# Patient Record
Sex: Female | Born: 1935 | Race: White | Hispanic: No | Marital: Married | Smoking: Former smoker
Health system: Southern US, Community
[De-identification: ages and names within clinical notes are randomized; demographics above are authoritative.]

## PROBLEM LIST (undated history)

## (undated) DIAGNOSIS — N390 Urinary tract infection, site not specified: Secondary | ICD-10-CM

## (undated) DIAGNOSIS — L409 Psoriasis, unspecified: Secondary | ICD-10-CM

## (undated) DIAGNOSIS — E039 Hypothyroidism, unspecified: Secondary | ICD-10-CM

## (undated) DIAGNOSIS — I509 Heart failure, unspecified: Secondary | ICD-10-CM

## (undated) DIAGNOSIS — E042 Nontoxic multinodular goiter: Secondary | ICD-10-CM

## (undated) DIAGNOSIS — I5022 Chronic systolic (congestive) heart failure: Secondary | ICD-10-CM

## (undated) DIAGNOSIS — F32A Depression, unspecified: Secondary | ICD-10-CM

## (undated) DIAGNOSIS — K52831 Collagenous colitis: Secondary | ICD-10-CM

## (undated) DIAGNOSIS — E7800 Pure hypercholesterolemia, unspecified: Secondary | ICD-10-CM

## (undated) DIAGNOSIS — T4145XA Adverse effect of unspecified anesthetic, initial encounter: Secondary | ICD-10-CM

## (undated) DIAGNOSIS — D649 Anemia, unspecified: Secondary | ICD-10-CM

## (undated) DIAGNOSIS — Z87442 Personal history of urinary calculi: Secondary | ICD-10-CM

## (undated) DIAGNOSIS — F419 Anxiety disorder, unspecified: Secondary | ICD-10-CM

## (undated) DIAGNOSIS — M069 Rheumatoid arthritis, unspecified: Secondary | ICD-10-CM

## (undated) DIAGNOSIS — T8859XA Other complications of anesthesia, initial encounter: Secondary | ICD-10-CM

## (undated) DIAGNOSIS — C801 Malignant (primary) neoplasm, unspecified: Secondary | ICD-10-CM

## (undated) DIAGNOSIS — J189 Pneumonia, unspecified organism: Secondary | ICD-10-CM

## (undated) DIAGNOSIS — I10 Essential (primary) hypertension: Secondary | ICD-10-CM

## (undated) DIAGNOSIS — R51 Headache: Secondary | ICD-10-CM

## (undated) DIAGNOSIS — F329 Major depressive disorder, single episode, unspecified: Secondary | ICD-10-CM

## (undated) DIAGNOSIS — I209 Angina pectoris, unspecified: Secondary | ICD-10-CM

## (undated) HISTORY — DX: Urinary tract infection, site not specified: N39.0

## (undated) HISTORY — DX: Psoriasis, unspecified: L40.9

## (undated) HISTORY — DX: Major depressive disorder, single episode, unspecified: F32.9

## (undated) HISTORY — PX: ABDOMINAL HYSTERECTOMY: SHX81

## (undated) HISTORY — PX: MASTECTOMY: SHX3

## (undated) HISTORY — PX: THYROID SURGERY: SHX805

## (undated) HISTORY — PX: CHOLECYSTECTOMY: SHX55

## (undated) HISTORY — PX: CARPAL TUNNEL RELEASE: SHX101

## (undated) HISTORY — DX: Anemia, unspecified: D64.9

## (undated) HISTORY — PX: TONSILLECTOMY: SUR1361

## (undated) HISTORY — DX: Nontoxic multinodular goiter: E04.2

## (undated) HISTORY — DX: Hypothyroidism, unspecified: E03.9

## (undated) HISTORY — DX: Rheumatoid arthritis, unspecified: M06.9

## (undated) HISTORY — DX: Depression, unspecified: F32.A

## (undated) HISTORY — PX: OTHER SURGICAL HISTORY: SHX169

## (undated) HISTORY — PX: CATARACT EXTRACTION: SUR2

## (undated) HISTORY — PX: SKIN GRAFT: SHX250

## (undated) HISTORY — DX: Chronic systolic (congestive) heart failure: I50.22

## (undated) HISTORY — PX: APPENDECTOMY: SHX54

## (undated) HISTORY — PX: BREAST SURGERY: SHX581

---

## 1998-04-26 ENCOUNTER — Other Ambulatory Visit: Admission: RE | Admit: 1998-04-26 | Discharge: 1998-04-26 | Payer: Self-pay | Admitting: Oncology

## 1999-09-18 ENCOUNTER — Encounter: Payer: Self-pay | Admitting: Family Medicine

## 1999-09-21 ENCOUNTER — Inpatient Hospital Stay (HOSPITAL_COMMUNITY): Admission: RE | Admit: 1999-09-21 | Discharge: 1999-09-25 | Payer: Self-pay | Admitting: Orthopedic Surgery

## 1999-09-26 ENCOUNTER — Encounter: Admission: RE | Admit: 1999-09-26 | Discharge: 1999-12-25 | Payer: Self-pay | Admitting: Orthopedic Surgery

## 1999-12-25 ENCOUNTER — Encounter: Admission: RE | Admit: 1999-12-25 | Discharge: 2000-01-24 | Payer: Self-pay | Admitting: Orthopedic Surgery

## 2000-06-26 ENCOUNTER — Encounter: Payer: Self-pay | Admitting: Oncology

## 2000-06-26 ENCOUNTER — Encounter: Admission: RE | Admit: 2000-06-26 | Discharge: 2000-06-26 | Payer: Self-pay | Admitting: Oncology

## 2001-06-29 ENCOUNTER — Encounter: Payer: Self-pay | Admitting: Rheumatology

## 2001-06-29 ENCOUNTER — Encounter: Admission: RE | Admit: 2001-06-29 | Discharge: 2001-06-29 | Payer: Self-pay | Admitting: Rheumatology

## 2001-07-01 ENCOUNTER — Encounter: Admission: RE | Admit: 2001-07-01 | Discharge: 2001-07-01 | Payer: Self-pay | Admitting: Oncology

## 2001-07-01 ENCOUNTER — Encounter: Payer: Self-pay | Admitting: Oncology

## 2002-07-06 ENCOUNTER — Encounter: Payer: Self-pay | Admitting: Oncology

## 2002-07-06 ENCOUNTER — Encounter: Admission: RE | Admit: 2002-07-06 | Discharge: 2002-07-06 | Payer: Self-pay | Admitting: Oncology

## 2002-07-28 ENCOUNTER — Other Ambulatory Visit: Admission: RE | Admit: 2002-07-28 | Discharge: 2002-07-28 | Payer: Self-pay | Admitting: Family Medicine

## 2002-08-03 ENCOUNTER — Encounter: Payer: Self-pay | Admitting: Family Medicine

## 2002-08-03 ENCOUNTER — Ambulatory Visit (HOSPITAL_COMMUNITY): Admission: RE | Admit: 2002-08-03 | Discharge: 2002-08-03 | Payer: Self-pay | Admitting: Family Medicine

## 2002-09-03 ENCOUNTER — Encounter: Admission: RE | Admit: 2002-09-03 | Discharge: 2002-09-03 | Payer: Self-pay | Admitting: General Surgery

## 2002-09-03 ENCOUNTER — Encounter: Payer: Self-pay | Admitting: General Surgery

## 2002-10-01 ENCOUNTER — Encounter: Payer: Self-pay | Admitting: General Surgery

## 2002-10-05 ENCOUNTER — Observation Stay (HOSPITAL_COMMUNITY): Admission: RE | Admit: 2002-10-05 | Discharge: 2002-10-06 | Payer: Self-pay | Admitting: General Surgery

## 2002-10-05 ENCOUNTER — Encounter (INDEPENDENT_AMBULATORY_CARE_PROVIDER_SITE_OTHER): Payer: Self-pay

## 2003-07-08 ENCOUNTER — Encounter: Admission: RE | Admit: 2003-07-08 | Discharge: 2003-07-08 | Payer: Self-pay | Admitting: Oncology

## 2003-07-08 ENCOUNTER — Encounter: Payer: Self-pay | Admitting: Oncology

## 2004-07-31 ENCOUNTER — Encounter: Admission: RE | Admit: 2004-07-31 | Discharge: 2004-07-31 | Payer: Self-pay | Admitting: Rheumatology

## 2005-07-26 ENCOUNTER — Ambulatory Visit: Payer: Self-pay | Admitting: Oncology

## 2005-08-01 ENCOUNTER — Encounter: Admission: RE | Admit: 2005-08-01 | Discharge: 2005-08-01 | Payer: Self-pay | Admitting: Oncology

## 2006-07-23 ENCOUNTER — Other Ambulatory Visit: Admission: RE | Admit: 2006-07-23 | Discharge: 2006-07-23 | Payer: Self-pay | Admitting: Family Medicine

## 2006-07-24 ENCOUNTER — Ambulatory Visit: Payer: Self-pay | Admitting: Oncology

## 2006-07-28 ENCOUNTER — Ambulatory Visit: Admission: RE | Admit: 2006-07-28 | Discharge: 2006-07-28 | Payer: Self-pay | Admitting: Family Medicine

## 2006-08-04 ENCOUNTER — Encounter: Admission: RE | Admit: 2006-08-04 | Discharge: 2006-08-04 | Payer: Self-pay | Admitting: Family Medicine

## 2007-07-22 ENCOUNTER — Ambulatory Visit: Payer: Self-pay | Admitting: Oncology

## 2007-08-19 ENCOUNTER — Ambulatory Visit (HOSPITAL_COMMUNITY): Admission: RE | Admit: 2007-08-19 | Discharge: 2007-08-19 | Payer: Self-pay | Admitting: Orthopedic Surgery

## 2007-09-01 LAB — CBC WITH DIFFERENTIAL/PLATELET
BASO%: 0.5 % (ref 0.0–2.0)
Eosinophils Absolute: 0.1 10*3/uL (ref 0.0–0.5)
HCT: 37.4 % (ref 34.8–46.6)
LYMPH%: 12 % — ABNORMAL LOW (ref 14.0–48.0)
MCHC: 34.9 g/dL (ref 32.0–36.0)
NEUT#: 8.1 10*3/uL — ABNORMAL HIGH (ref 1.5–6.5)
NEUT%: 78.6 % — ABNORMAL HIGH (ref 39.6–76.8)
RBC: 4.06 10*6/uL (ref 3.70–5.32)
WBC: 10.3 10*3/uL — ABNORMAL HIGH (ref 3.9–10.0)

## 2007-09-01 LAB — COMPREHENSIVE METABOLIC PANEL
AST: 20 U/L (ref 0–37)
Albumin: 4.3 g/dL (ref 3.5–5.2)
Alkaline Phosphatase: 49 U/L (ref 39–117)
BUN: 26 mg/dL — ABNORMAL HIGH (ref 6–23)
Total Protein: 6.6 g/dL (ref 6.0–8.3)

## 2007-09-02 ENCOUNTER — Encounter: Admission: RE | Admit: 2007-09-02 | Discharge: 2007-09-02 | Payer: Self-pay | Admitting: Oncology

## 2008-08-31 ENCOUNTER — Ambulatory Visit: Payer: Self-pay | Admitting: Oncology

## 2008-09-02 ENCOUNTER — Encounter: Admission: RE | Admit: 2008-09-02 | Discharge: 2008-09-02 | Payer: Self-pay | Admitting: Oncology

## 2008-09-02 LAB — LACTATE DEHYDROGENASE: LDH: 134 U/L (ref 94–250)

## 2008-09-02 LAB — CBC WITH DIFFERENTIAL/PLATELET
BASO%: 0.5 % (ref 0.0–2.0)
EOS%: 1.2 % (ref 0.0–7.0)
HGB: 11.1 g/dL — ABNORMAL LOW (ref 11.6–15.9)
LYMPH%: 23.3 % (ref 14.0–48.0)
MCHC: 33.8 g/dL (ref 32.0–36.0)
MCV: 85.2 fL (ref 81.0–101.0)
Platelets: 313 10*3/uL (ref 145–400)
RBC: 3.85 10*6/uL (ref 3.70–5.32)
RDW: 14.1 % (ref 11.3–14.5)

## 2008-09-02 LAB — COMPREHENSIVE METABOLIC PANEL
ALT: 12 U/L (ref 0–35)
AST: 9 U/L (ref 0–37)
BUN: 14 mg/dL (ref 6–23)
CO2: 20 mEq/L (ref 19–32)
Calcium: 8.8 mg/dL (ref 8.4–10.5)
Chloride: 107 mEq/L (ref 96–112)
Total Bilirubin: 0.2 mg/dL — ABNORMAL LOW (ref 0.3–1.2)
Total Protein: 6.4 g/dL (ref 6.0–8.3)

## 2009-08-31 ENCOUNTER — Ambulatory Visit: Payer: Self-pay | Admitting: Oncology

## 2009-09-04 ENCOUNTER — Encounter: Admission: RE | Admit: 2009-09-04 | Discharge: 2009-09-04 | Payer: Self-pay | Admitting: Oncology

## 2009-10-19 ENCOUNTER — Ambulatory Visit: Payer: Self-pay | Admitting: Oncology

## 2010-09-05 ENCOUNTER — Encounter: Admission: RE | Admit: 2010-09-05 | Discharge: 2010-09-05 | Payer: Self-pay | Admitting: Oncology

## 2010-10-18 ENCOUNTER — Ambulatory Visit: Payer: Self-pay | Admitting: Oncology

## 2010-11-11 ENCOUNTER — Encounter: Payer: Self-pay | Admitting: Oncology

## 2011-02-06 ENCOUNTER — Other Ambulatory Visit: Payer: Self-pay | Admitting: Family Medicine

## 2011-02-07 ENCOUNTER — Ambulatory Visit
Admission: RE | Admit: 2011-02-07 | Discharge: 2011-02-07 | Disposition: A | Discharge status: Home / Self Care | Payer: Medicare Other | Source: Ambulatory Visit | Attending: Family Medicine | Admitting: Family Medicine

## 2011-02-07 MED ORDER — IOHEXOL 300 MG/ML  SOLN
125.0000 mL | Freq: Once | INTRAMUSCULAR | Status: AC | PRN
  Administered 2011-02-07: 125 mL via INTRAVENOUS

## 2011-03-08 NOTE — Op Note (Signed)
Olivia Mullen, Olivia Mullen                         ACCOUNT NO.:  [id number]   MEDICAL RECORD NO.:  [id number]                   PATIENT TYPE:  AMB   LOCATION:  DAY                                  FACILITY:  [facility]   PHYSICIAN:  Gita Kudo, M.D.              DATE OF BIRTH:  [birth year]   DATE OF PROCEDURE:  10/05/2002  DATE OF DISCHARGE:                                 OPERATIVE REPORT   OPERATIVE PROCEDURE:  Total thyroidectomy.   SURGEON:  Gita Kudo, M.D.   ASSISTANT:  Velora Heckler, M.D.   ANESTHESIA:  General endotracheal.   PREOPERATIVE DIAGNOSIS:  Large, multinodular goiter with pressure symptoms.   POSTOPERATIVE DIAGNOSIS:  Large, multinodular goiter with pressure symptoms.   CLINICAL SUMMARY:  A 75 year old lady with multiple medical problems, has  had breast cancer, arthritis, shoulder surgery, and has an enlarging mass in  her neck.  Ultrasound consistent with multinodular goiter.  She is on  Synthroid, and the mass is enlarging.   OPERATIVE FINDINGS:  The patient had a large, multilobulated, nodular  goiter.  Both lobes were equally massively enlarged and were pressing on the  trachea and esophagus.  The parathyroid glands and recurrent nerves were not  injured.   OPERATIVE PROCEDURE:  Under satisfactory general endotracheal anesthesia,  the patient was carefully positioned and then prepped and draped in the  standard fashion.  Transverse neck incision made and carried down through  the platysma and a large amount of subcu fat.  Then flaps were developed  above the strap muscles and self-retaining retractors placed.  The midline  was opened and the left side and right side felt.  The right side was  removed first.   A plane developed under the strap muscles over the gland.  Gentle finger  dissection was used to retract the very large gland medially.  Then the  inferior vein was divided between clamps and ties of silk.  The superior  pole was taken down  between clamps and ties of silk and also metal clips.  Then the gland was carefully mobilized medially, and the parathyroid and  area of recurrent nerve were not entered and pushed away from the gland.  We  stayed close to the trachea and using cautery, removed the gland over to the  isthmus which was quite thin.  This then removed the entire right lobe.  The  bed was checked for hemostasis, lavaged with saline, and packed with a light  gauze.   Then the left side was approached in a similar fashion.  Again, gentle  finger dissection was used, and the gland placed on traction.  Care was  taken laterally to avoid the areas of nerve and parathyroid, and parathyroid  glands were identified and not hurt.  The vessels were divided between  clamps and ties of 2-0 silk or metal clips. Again, the gland was then  removed  from the underlying trachea.  Following this, the operative site was  checked for hemostasis and made dry by cautery and clips.  A small piece of  Surgicel was placed on each bed and then the wound closed in  layers with running 3-0 Vicryl for external oblique, interrupted 4-0 Vicryl  for platysma, Steri-Strips and staples for skin.  Sterile dressings were  applied, and the patient went to the recovery room from the operating room  in good condition.                                               Gita Kudo, M.D.    MRL/MEDQ  D:  10/05/2002  T:  10/05/2002  Job:  [id number]   cc:   Demetria Pore. Coral Spikes, M.D.  [address]  [address]  [place]  [place] [address]  Fax: [phone]   Stacie Acres. White, M.D.  [address]  [place]  [place] [address]  Fax: [phone]

## 2011-08-02 ENCOUNTER — Other Ambulatory Visit: Payer: Self-pay | Admitting: Oncology

## 2011-08-02 DIAGNOSIS — Z1231 Encounter for screening mammogram for malignant neoplasm of breast: Secondary | ICD-10-CM

## 2011-08-02 DIAGNOSIS — Z9011 Acquired absence of right breast and nipple: Secondary | ICD-10-CM

## 2011-09-09 ENCOUNTER — Ambulatory Visit
Admission: RE | Admit: 2011-09-09 | Discharge: 2011-09-09 | Disposition: A | Discharge status: Home / Self Care | Payer: Medicare Other | Source: Ambulatory Visit | Attending: Oncology | Admitting: Oncology

## 2011-09-09 DIAGNOSIS — Z9011 Acquired absence of right breast and nipple: Secondary | ICD-10-CM

## 2011-09-09 DIAGNOSIS — Z1231 Encounter for screening mammogram for malignant neoplasm of breast: Secondary | ICD-10-CM

## 2011-10-01 ENCOUNTER — Telehealth: Payer: Self-pay | Admitting: Oncology

## 2011-10-01 NOTE — Telephone Encounter (Signed)
Called pt , left message to call us back to r/s appt cancelled due to Epic

## 2011-10-31 DIAGNOSIS — Z79899 Other long term (current) drug therapy: Secondary | ICD-10-CM | POA: Diagnosis not present

## 2011-10-31 DIAGNOSIS — M1990 Unspecified osteoarthritis, unspecified site: Secondary | ICD-10-CM | POA: Diagnosis not present

## 2011-10-31 DIAGNOSIS — M069 Rheumatoid arthritis, unspecified: Secondary | ICD-10-CM | POA: Diagnosis not present

## 2011-10-31 DIAGNOSIS — M2550 Pain in unspecified joint: Secondary | ICD-10-CM | POA: Diagnosis not present

## 2011-11-13 DIAGNOSIS — E039 Hypothyroidism, unspecified: Secondary | ICD-10-CM | POA: Diagnosis not present

## 2011-12-04 DIAGNOSIS — M069 Rheumatoid arthritis, unspecified: Secondary | ICD-10-CM | POA: Diagnosis not present

## 2011-12-04 DIAGNOSIS — Z79899 Other long term (current) drug therapy: Secondary | ICD-10-CM | POA: Diagnosis not present

## 2011-12-16 ENCOUNTER — Encounter: Payer: Self-pay | Admitting: Oncology

## 2011-12-16 ENCOUNTER — Other Ambulatory Visit: Payer: Medicare Other | Admitting: Lab

## 2011-12-16 ENCOUNTER — Ambulatory Visit (HOSPITAL_BASED_OUTPATIENT_CLINIC_OR_DEPARTMENT_OTHER): Payer: Medicare Other | Admitting: Oncology

## 2011-12-16 VITALS — BP 167/66 | HR 119 | Temp 98.0°F | Ht 64.0 in | Wt 190.5 lb

## 2011-12-16 DIAGNOSIS — E119 Type 2 diabetes mellitus without complications: Secondary | ICD-10-CM

## 2011-12-16 DIAGNOSIS — M069 Rheumatoid arthritis, unspecified: Secondary | ICD-10-CM | POA: Diagnosis not present

## 2011-12-16 DIAGNOSIS — C50919 Malignant neoplasm of unspecified site of unspecified female breast: Secondary | ICD-10-CM

## 2011-12-16 DIAGNOSIS — C50419 Malignant neoplasm of upper-outer quadrant of unspecified female breast: Secondary | ICD-10-CM

## 2011-12-16 DIAGNOSIS — E039 Hypothyroidism, unspecified: Secondary | ICD-10-CM

## 2011-12-16 DIAGNOSIS — Z1231 Encounter for screening mammogram for malignant neoplasm of breast: Secondary | ICD-10-CM

## 2011-12-16 DIAGNOSIS — E042 Nontoxic multinodular goiter: Secondary | ICD-10-CM

## 2011-12-16 DIAGNOSIS — D649 Anemia, unspecified: Secondary | ICD-10-CM

## 2011-12-16 HISTORY — DX: Rheumatoid arthritis, unspecified: M06.9

## 2011-12-16 HISTORY — DX: Nontoxic multinodular goiter: E04.2

## 2011-12-16 HISTORY — DX: Anemia, unspecified: D64.9

## 2011-12-16 HISTORY — DX: Hypothyroidism, unspecified: E03.9

## 2011-12-16 LAB — COMPREHENSIVE METABOLIC PANEL
ALT: 22 U/L (ref 0–35)
BUN: 15 mg/dL (ref 6–23)
CO2: 20 mEq/L (ref 19–32)
Calcium: 9.3 mg/dL (ref 8.4–10.5)
Chloride: 112 mEq/L (ref 96–112)
Glucose, Bld: 83 mg/dL (ref 70–99)
Potassium: 4.1 mEq/L (ref 3.5–5.3)
Total Protein: 5.6 g/dL — ABNORMAL LOW (ref 6.0–8.3)

## 2011-12-16 LAB — CBC WITH DIFFERENTIAL/PLATELET
BASO%: 1.1 % (ref 0.0–2.0)
Basophils Absolute: 0.1 10*3/uL (ref 0.0–0.1)
HGB: 12.7 g/dL (ref 11.6–15.9)
MONO%: 9.5 % (ref 0.0–14.0)
NEUT#: 5.8 10*3/uL (ref 1.5–6.5)
Platelets: 222 10*3/uL (ref 145–400)
RBC: 4.29 10*6/uL (ref 3.70–5.45)
WBC: 9 10*3/uL (ref 3.9–10.3)

## 2011-12-16 NOTE — Progress Notes (Signed)
Hematology and Oncology Follow Up Visit  Olivia Mullen [id number] 1936-02-24 76 y.o. 12/16/2011 7:42 PM   Principle Diagnosis: Encounter Diagnoses  Name Primary?  . Breast cancer, stage 2 Yes  . Rheumatoid arthritis   . Multinodular goiter (nontoxic)   . Hypothyroidism (acquired)   . DM type 2 (diabetes mellitus, type 2)   . Normochromic normocytic anemia      Interim History:   It is hard to believe that Olivia Mullen is now out over 19 years from diagnosis of  stage II 1 node positive ER positive synchronous primary tumors of the left breast diagnosed in November 1993. She was treated with mastectomy with immediate TRAM reconstruction followed by CAF chemotherapy then tamoxifen hormonal therapy. She remains free of any obvious recurrence at this time. Most recent mammography done 09/09/2011 shows no new disease.  Her main active medical problem continues to be advanced rheumatoid arthritis. She is on chronic steroids. She is receiving Enbrel injections, Plaquenil, and Arava. Current prednisone dose 8 mg daily. She is now followed by Dr. Zenovia Jordan since Dr.Levitin retired.  Medications: reviewed  Allergies: No Known Allergies  Review of Systems: Constitutional:  Chronic fatigue no weight loss  Respiratory: No dyspnea or cough Cardiovascular:  No ischemic type chest pain, pressure, or palpitations. Gastrointestinal: No change in bowel habit. No hematochezia or melena. Genito-Urinary: No vaginal bleeding. Musculoskeletal: No bone pain other than that related to her arthritis. Chronic arthralgias. Neurologic: Chronic migraine headaches. No change in pattern. Skin: Recent dermatitis on the nose and under the right chin She has had bilateral cataract removal since last visit here Remaining ROS negative.  Physical Exam: Blood pressure 167/66, pulse 119, temperature 98 F (36.7 C), temperature source Oral, height 5\' 4"  (1.626 m), weight 190 lb 8 oz (86.41 kg). Wt Readings from  Last 3 Encounters:  12/16/11 190 lb 8 oz (86.41 kg)     General appearance: A mildly cushingoid Caucasian woman HENNT: Abrasion on the nose; pharynx no erythema or exudate Lymph nodes: No cervical supraclavicular or axillary adenopathy Breasts: Right TRAM reconstruction and no left breast masses no skin lesions Lungs: Clear to auscultation resonant to percussion Heart: Regular rhythm no murmur Abdomen: Soft nontender no mass no organomegaly Extremities: No edema no calf tenderness Vascular: No cyanosis Neurologic: Motor strength 5 over 5 cranial nerves grossly normal optic disc sharp on the left not well visualized on the right Skin: Dermatitis over the nose which appears to be healing and self-limited  Lab Results: Lab Results  Component Value Date   WBC 9.0 12/16/2011   HGB 12.7 12/16/2011   HCT 38.6 12/16/2011   MCV 89.8 12/16/2011   PLT 222 12/16/2011     Chemistry      Component Value Date/Time   NA 142 12/16/2011 1148   K 4.1 12/16/2011 1148   CL 112 12/16/2011 1148   CO2 20 12/16/2011 1148   BUN 15 12/16/2011 1148   CREATININE 0.75 12/16/2011 1148      Component Value Date/Time   CALCIUM 9.3 12/16/2011 1148   ALKPHOS 50 12/16/2011 1148   AST 17 12/16/2011 1148   ALT 22 12/16/2011 1148   BILITOT 0.3 12/16/2011 1148       Radiological Studies: See above    Impression and Plan: #1. Synchronous primary tumors of the left breast treated as outlined above. She remains free of recurrence now out over 19 years. I told her I will see her again next year and then she could  graduate from our practice.  #2. Advanced rheumatoid arthritis on multiple medications.  #3. History of multinodular goiter.  #4. Hypothyroid on replacement.  #5. Steroid-induced diabetes.   CC:. Dr. Merri Brunette; Dr. Zenovia Jordan   Levert Feinstein, MD 2/25/20137:42 PM

## 2011-12-18 DIAGNOSIS — M1710 Unilateral primary osteoarthritis, unspecified knee: Secondary | ICD-10-CM | POA: Diagnosis not present

## 2011-12-25 ENCOUNTER — Telehealth: Payer: Self-pay | Admitting: Oncology

## 2011-12-25 NOTE — Telephone Encounter (Signed)
Called pt, left message regarding mammogram for November 2013, also reminded pt of appt on February 2014

## 2012-01-09 DIAGNOSIS — E876 Hypokalemia: Secondary | ICD-10-CM | POA: Diagnosis not present

## 2012-01-09 DIAGNOSIS — E039 Hypothyroidism, unspecified: Secondary | ICD-10-CM | POA: Diagnosis not present

## 2012-01-20 DIAGNOSIS — M2550 Pain in unspecified joint: Secondary | ICD-10-CM | POA: Diagnosis not present

## 2012-01-20 DIAGNOSIS — IMO0001 Reserved for inherently not codable concepts without codable children: Secondary | ICD-10-CM | POA: Diagnosis not present

## 2012-01-20 DIAGNOSIS — R0602 Shortness of breath: Secondary | ICD-10-CM | POA: Diagnosis not present

## 2012-01-20 DIAGNOSIS — M069 Rheumatoid arthritis, unspecified: Secondary | ICD-10-CM | POA: Diagnosis not present

## 2012-01-20 DIAGNOSIS — Z79899 Other long term (current) drug therapy: Secondary | ICD-10-CM | POA: Diagnosis not present

## 2012-01-20 DIAGNOSIS — G43909 Migraine, unspecified, not intractable, without status migrainosus: Secondary | ICD-10-CM | POA: Diagnosis not present

## 2012-01-20 DIAGNOSIS — M1990 Unspecified osteoarthritis, unspecified site: Secondary | ICD-10-CM | POA: Diagnosis not present

## 2012-02-06 DIAGNOSIS — R350 Frequency of micturition: Secondary | ICD-10-CM | POA: Diagnosis not present

## 2012-02-11 ENCOUNTER — Other Ambulatory Visit: Payer: Self-pay | Admitting: Oncology

## 2012-02-12 DIAGNOSIS — IMO0001 Reserved for inherently not codable concepts without codable children: Secondary | ICD-10-CM | POA: Diagnosis not present

## 2012-02-12 DIAGNOSIS — F329 Major depressive disorder, single episode, unspecified: Secondary | ICD-10-CM | POA: Diagnosis not present

## 2012-02-12 DIAGNOSIS — I10 Essential (primary) hypertension: Secondary | ICD-10-CM | POA: Diagnosis not present

## 2012-02-12 DIAGNOSIS — E039 Hypothyroidism, unspecified: Secondary | ICD-10-CM | POA: Diagnosis not present

## 2012-02-12 DIAGNOSIS — E559 Vitamin D deficiency, unspecified: Secondary | ICD-10-CM | POA: Diagnosis not present

## 2012-02-12 DIAGNOSIS — E119 Type 2 diabetes mellitus without complications: Secondary | ICD-10-CM | POA: Diagnosis not present

## 2012-02-12 DIAGNOSIS — R0602 Shortness of breath: Secondary | ICD-10-CM | POA: Diagnosis not present

## 2012-02-12 DIAGNOSIS — Z23 Encounter for immunization: Secondary | ICD-10-CM | POA: Diagnosis not present

## 2012-02-12 DIAGNOSIS — M1990 Unspecified osteoarthritis, unspecified site: Secondary | ICD-10-CM | POA: Diagnosis not present

## 2012-02-13 DIAGNOSIS — F3342 Major depressive disorder, recurrent, in full remission: Secondary | ICD-10-CM | POA: Diagnosis not present

## 2012-03-03 DIAGNOSIS — Z049 Encounter for examination and observation for unspecified reason: Secondary | ICD-10-CM | POA: Diagnosis not present

## 2012-03-03 DIAGNOSIS — G43719 Chronic migraine without aura, intractable, without status migrainosus: Secondary | ICD-10-CM | POA: Diagnosis not present

## 2012-03-04 DIAGNOSIS — IMO0001 Reserved for inherently not codable concepts without codable children: Secondary | ICD-10-CM | POA: Diagnosis not present

## 2012-03-04 DIAGNOSIS — R51 Headache: Secondary | ICD-10-CM | POA: Diagnosis not present

## 2012-03-04 DIAGNOSIS — M542 Cervicalgia: Secondary | ICD-10-CM | POA: Diagnosis not present

## 2012-03-04 DIAGNOSIS — G518 Other disorders of facial nerve: Secondary | ICD-10-CM | POA: Diagnosis not present

## 2012-03-05 ENCOUNTER — Emergency Department (HOSPITAL_COMMUNITY): Payer: Medicare Other

## 2012-03-05 ENCOUNTER — Encounter (HOSPITAL_COMMUNITY): Payer: Self-pay | Admitting: *Deleted

## 2012-03-05 ENCOUNTER — Inpatient Hospital Stay (HOSPITAL_COMMUNITY)
Admission: EM | Admit: 2012-03-05 | Discharge: 2012-03-09 | DRG: 293 | Disposition: A | Discharge status: Home / Self Care | Payer: Medicare Other | Source: Ambulatory Visit | Attending: Internal Medicine | Admitting: Internal Medicine

## 2012-03-05 DIAGNOSIS — E042 Nontoxic multinodular goiter: Secondary | ICD-10-CM

## 2012-03-05 DIAGNOSIS — I5023 Acute on chronic systolic (congestive) heart failure: Principal | ICD-10-CM | POA: Diagnosis present

## 2012-03-05 DIAGNOSIS — I5021 Acute systolic (congestive) heart failure: Secondary | ICD-10-CM | POA: Diagnosis not present

## 2012-03-05 DIAGNOSIS — I517 Cardiomegaly: Secondary | ICD-10-CM | POA: Diagnosis not present

## 2012-03-05 DIAGNOSIS — I10 Essential (primary) hypertension: Secondary | ICD-10-CM | POA: Diagnosis not present

## 2012-03-05 DIAGNOSIS — M1710 Unilateral primary osteoarthritis, unspecified knee: Secondary | ICD-10-CM | POA: Diagnosis not present

## 2012-03-05 DIAGNOSIS — C50919 Malignant neoplasm of unspecified site of unspecified female breast: Secondary | ICD-10-CM | POA: Diagnosis present

## 2012-03-05 DIAGNOSIS — Z853 Personal history of malignant neoplasm of breast: Secondary | ICD-10-CM

## 2012-03-05 DIAGNOSIS — M79609 Pain in unspecified limb: Secondary | ICD-10-CM | POA: Diagnosis not present

## 2012-03-05 DIAGNOSIS — M25559 Pain in unspecified hip: Secondary | ICD-10-CM | POA: Diagnosis not present

## 2012-03-05 DIAGNOSIS — M25469 Effusion, unspecified knee: Secondary | ICD-10-CM | POA: Diagnosis not present

## 2012-03-05 DIAGNOSIS — I509 Heart failure, unspecified: Secondary | ICD-10-CM | POA: Diagnosis present

## 2012-03-05 DIAGNOSIS — Z87891 Personal history of nicotine dependence: Secondary | ICD-10-CM

## 2012-03-05 DIAGNOSIS — R079 Chest pain, unspecified: Secondary | ICD-10-CM | POA: Diagnosis not present

## 2012-03-05 DIAGNOSIS — R0601 Orthopnea: Secondary | ICD-10-CM | POA: Diagnosis not present

## 2012-03-05 DIAGNOSIS — J90 Pleural effusion, not elsewhere classified: Secondary | ICD-10-CM | POA: Diagnosis not present

## 2012-03-05 DIAGNOSIS — D649 Anemia, unspecified: Secondary | ICD-10-CM

## 2012-03-05 DIAGNOSIS — Z79899 Other long term (current) drug therapy: Secondary | ICD-10-CM

## 2012-03-05 DIAGNOSIS — E119 Type 2 diabetes mellitus without complications: Secondary | ICD-10-CM | POA: Diagnosis present

## 2012-03-05 DIAGNOSIS — J9819 Other pulmonary collapse: Secondary | ICD-10-CM | POA: Diagnosis not present

## 2012-03-05 DIAGNOSIS — R0600 Dyspnea, unspecified: Secondary | ICD-10-CM

## 2012-03-05 DIAGNOSIS — E876 Hypokalemia: Secondary | ICD-10-CM | POA: Diagnosis not present

## 2012-03-05 DIAGNOSIS — R0602 Shortness of breath: Secondary | ICD-10-CM | POA: Diagnosis not present

## 2012-03-05 DIAGNOSIS — E1169 Type 2 diabetes mellitus with other specified complication: Secondary | ICD-10-CM | POA: Diagnosis not present

## 2012-03-05 DIAGNOSIS — IMO0002 Reserved for concepts with insufficient information to code with codable children: Secondary | ICD-10-CM

## 2012-03-05 DIAGNOSIS — E039 Hypothyroidism, unspecified: Secondary | ICD-10-CM | POA: Diagnosis present

## 2012-03-05 DIAGNOSIS — M069 Rheumatoid arthritis, unspecified: Secondary | ICD-10-CM | POA: Diagnosis present

## 2012-03-05 DIAGNOSIS — M1610 Unilateral primary osteoarthritis, unspecified hip: Secondary | ICD-10-CM | POA: Diagnosis not present

## 2012-03-05 HISTORY — DX: Heart failure, unspecified: I50.9

## 2012-03-05 HISTORY — DX: Malignant (primary) neoplasm, unspecified: C80.1

## 2012-03-05 HISTORY — DX: Headache: R51

## 2012-03-05 HISTORY — DX: Anemia, unspecified: D64.9

## 2012-03-05 LAB — CBC
MCH: 29.9 pg (ref 26.0–34.0)
MCHC: 32.4 g/dL (ref 30.0–36.0)
MCV: 92.2 fL (ref 78.0–100.0)
Platelets: 218 10*3/uL (ref 150–400)
RDW: 14.6 % (ref 11.5–15.5)

## 2012-03-05 NOTE — ED Notes (Signed)
Had breathing tx and chest xray at urgent care

## 2012-03-05 NOTE — ED Provider Notes (Signed)
History     CSN: [id number]  Arrival date & time 03/05/12  2152   First MD Initiated Contact with Patient 03/05/12 2249      Chief Complaint  Patient presents with  . Shortness of Breath    (Consider location/radiation/quality/duration/timing/severity/associated sxs/prior treatment) HPI  Pt presents to the ED with worsening SOB since 4 weeks ago. The patient fell 1 week ago and has bruising and pleuritic chest pain to her anterior chest wall. She states that since then her SOB has been increasingly getting worse. Today she was unable to walk without getting very winded. She saw a doctor at the walk in clinic today where a chest xray was done and a breathing treatment and they sent her to the ED. She says that the breathing treatment did help her symptoms a lot. She is still complaining of pain. She also complaints of right hip and right knee pain. She denies a history of blood clots. Patients is tachycardic in triage and exam room. She does admit to having problems with anxiety, feeling stressed at home, and her medications for anxiety recently being changed to Wellbutrin.  Past Medical History  Diagnosis Date  . Rheumatoid arthritis 12/16/2011  . Multinodular goiter (nontoxic) 12/16/2011  . Hypothyroidism (acquired) 12/16/2011  . DM type 2 (diabetes mellitus, type 2) 12/16/2011  . Normochromic normocytic anemia 12/16/2011  . Cancer     breast, 20 years ago  . Shortness of breath   . Anemia   . Headache   . CHF (congestive heart failure)     Past Surgical History  Procedure Date  . Mastectomy     right  . Carpal tunnel release   . Thyroid surgery   . Abdominal hysterectomy   . Left shoulder     History reviewed. No pertinent family history.  History  Substance Use Topics  . Smoking status: Former Games developer  . Smokeless tobacco: Never Used  . Alcohol Use: Yes     occasional    OB History    Grav Para Term Preterm Abortions TAB SAB Ect Mult Living                   Review of Systems   HEENT: denies blurry vision or change in hearing PULMONARY:pt has chest pain and shortness of breath CARDIAC: denies chest pain or heart palpitations MUSCULOSKELETAL:  denies being unable to ambulate pt having chest wall pain ABDOMEN AL: denies abdominal pain GU: denies loss of bowel or urinary control NEURO: denies numbness and tingling in extremities   Allergies  Review of patient's allergies indicates no known allergies.  Home Medications   No current outpatient prescriptions on file.  BP 115/63  Pulse 100  Temp(Src) 97.8 F (36.6 C) (Oral)  Resp 18  Wt 182 lb 11.2 oz (82.872 kg)  SpO2 97%  Physical Exam  Nursing note and vitals reviewed. Constitutional: She appears well-developed and well-nourished. No distress.  HENT:  Head: Normocephalic and atraumatic.  Eyes: Pupils are equal, round, and reactive to light.  Neck: Normal range of motion. Neck supple.  Cardiovascular: Normal rate and regular rhythm.   Pulmonary/Chest: Effort normal. No respiratory distress. She has wheezes. She has no rales. Chest wall is not dull to percussion. She exhibits bony tenderness. She exhibits no mass, no tenderness, no crepitus and no retraction.         Patient had moderate bruising to her breasts and chest wall from her fall. No crepitus or obvious deformity.  Abdominal:  Soft. She exhibits no distension. There is no tenderness. There is no rebound.  Musculoskeletal:       Right hip: She exhibits tenderness. She exhibits normal range of motion, normal strength, no bony tenderness, no swelling, no crepitus, no deformity and no laceration.       Right knee: She exhibits normal range of motion, no swelling, no effusion, no ecchymosis, no deformity, no laceration and no erythema. tenderness found. Medial joint line and lateral joint line tenderness noted.  Neurological: She is alert.  Skin: Skin is warm and dry.    ED Course  Procedures (including critical care  time)  Labs Reviewed  CBC - Abnormal; Notable for the following:    WBC 11.5 (*)    All other components within normal limits  BASIC METABOLIC PANEL - Abnormal; Notable for the following:    Glucose, Bld 110 (*)    GFR calc non Af Amer 53 (*)    GFR calc Af Amer 62 (*)    All other components within normal limits  PRO B NATRIURETIC PEPTIDE - Abnormal; Notable for the following:    Pro B Natriuretic peptide (BNP) 5292.0 (*)    All other components within normal limits  CARDIAC PANEL(CRET KIN+CKTOT+MB+TROPI) - Abnormal; Notable for the following:    Total CK 581 (*)    CK, MB 6.0 (*)    All other components within normal limits  CBC - Abnormal; Notable for the following:    WBC 12.1 (*)    All other components within normal limits  CREATININE, SERUM - Abnormal; Notable for the following:    GFR calc non Af Amer 58 (*)    GFR calc Af Amer 68 (*)    All other components within normal limits  CARDIAC PANEL(CRET KIN+CKTOT+MB+TROPI) - Abnormal; Notable for the following:    Total CK 549 (*)    CK, MB 5.9 (*)    All other components within normal limits  CARDIAC PANEL(CRET KIN+CKTOT+MB+TROPI) - Abnormal; Notable for the following:    Total CK 492 (*)    CK, MB 5.4 (*)    All other components within normal limits  CARDIAC PANEL(CRET KIN+CKTOT+MB+TROPI) - Abnormal; Notable for the following:    Total CK 293 (*)    All other components within normal limits  GLUCOSE, CAPILLARY - Abnormal; Notable for the following:    Glucose-Capillary 66 (*)    All other components within normal limits  GLUCOSE, CAPILLARY - Abnormal; Notable for the following:    Glucose-Capillary 124 (*)    All other components within normal limits  BASIC METABOLIC PANEL - Abnormal; Notable for the following:    Potassium 2.6 (*)    Glucose, Bld 56 (*)    GFR calc non Af Amer 55 (*)    GFR calc Af Amer 64 (*)    All other components within normal limits  GLUCOSE, CAPILLARY - Abnormal; Notable for the following:     Glucose-Capillary 44 (*)    All other components within normal limits  GLUCOSE, CAPILLARY - Abnormal; Notable for the following:    Glucose-Capillary 115 (*)    All other components within normal limits  GLUCOSE, CAPILLARY - Abnormal; Notable for the following:    Glucose-Capillary 35 (*)    All other components within normal limits  GLUCOSE, CAPILLARY - Abnormal; Notable for the following:    Glucose-Capillary 44 (*)    All other components within normal limits  GLUCOSE, CAPILLARY - Abnormal; Notable for the following:  Glucose-Capillary 116 (*)    All other components within normal limits  GLUCOSE, CAPILLARY - Abnormal; Notable for the following:    Glucose-Capillary 67 (*)    All other components within normal limits  GLUCOSE, CAPILLARY - Abnormal; Notable for the following:    Glucose-Capillary 153 (*)    All other components within normal limits  HEMOGLOBIN A1C  TSH  GLUCOSE, CAPILLARY  CBC  GLUCOSE, CAPILLARY  MAGNESIUM  GLUCOSE, CAPILLARY  BASIC METABOLIC PANEL  BASIC METABOLIC PANEL   Dg Ribs Unilateral W/chest Left  03/05/2012  *RADIOLOGY REPORT*  Clinical Data: Shortness of breath and left lower rib pain after fall.  BB marks the area of pain.  LEFT RIBS AND CHEST - 3+ VIEW  Comparison: 03/05/2012 at 1952 hours from the [facility].  Findings: Mild cardiac enlargement.  Calcified and tortuous aorta. No focal airspace consolidation in the lungs.  No blunting of costophrenic angles.  No pneumothorax.  Surgical clips in the right axilla and base of neck.  Left shoulder arthroplasty.  Focal irregularities in the anterior left seventh and eighth ribs without acute cortical change likely representing old fracture deformities.  No acute fracture or displacement demonstrated.  No focal bone lesion or expansile change.  IMPRESSION: No evidence of active pulmonary disease.  Cardiac enlargement.  No acute left rib fractures demonstrated.  Original Report Authenticated By:  Marlon Pel, M.D.   Dg Hip Complete Right  03/05/2012  *RADIOLOGY REPORT*  Clinical Data: Right hip pain and instability.  RIGHT HIP - COMPLETE 2+ VIEW  Comparison: None.  Findings: Asymmetric and moderate to severe loss articular space noted in the right hip, particularly axially.  No erosion noted. There is only mild loss of articular space in the left hip. Ossific density along the right ischial tuberosity probably represents a chronically fragmented osteophyte.  There is spurring of the right femoral head.  No fracture or dislocation is observed.  IMPRESSION:  1.  Moderate to marked degenerative right hip arthropathy.  Mild left hip arthropathy. 2.  Suspected chronically fragmented osteophyte along the right hamstring origination.  Original Report Authenticated By: Dellia Cloud, M.D.   Ct Angio Chest W/cm &/or Wo Cm  03/06/2012  *RADIOLOGY REPORT*  Clinical Data: Shortness of breath with left-sided chest pain. Tachycardia.  Fall.  CT ANGIOGRAPHY CHEST  Technique:  Multidetector CT imaging of the chest using the standard protocol during bolus administration of intravenous contrast. Multiplanar reconstructed images including MIPs were obtained and reviewed to evaluate the vascular anatomy.  Contrast: OMNIPAQUE IOHEXOL 350 MG/ML SOLN  Comparison: None.  Findings: Technically adequate study with good opacification of the central and segmental pulmonary arteries.  No focal filling defects demonstrated.  No evidence of significant pulmonary embolus. Cardiac enlargement with passive congestion of contrast material into the liver.  Normal caliber thoracic aorta with calcification. Prominent right paratracheal and anterior mediastinal fat.  No significant lymphadenopathy in the chest.  Esophagus is mostly decompressed.  The small bilateral pleural effusions.  Mild bilateral basilar atelectasis.  No focal airspace consolidation or interstitial changes.  Scattered calcified granulomas in the  lungs. No pneumothorax.  Postoperative changes in the left shoulder.  No displaced rib fractures.  Mild degenerative changes in the thoracic spine.  Degenerative change in the right shoulder.  Previous thyroidectomy.  IMPRESSION: No evidence of significant pulmonary embolus.  Congestive changes in the heart lungs with cardiac enlargement, passive congestion of the liver, and small bilateral pleural effusions.  Mild bilateral basilar  atelectasis.  Original Report Authenticated By: Marlon Pel, M.D.   Dg Knee Complete 4 Views Right  03/05/2012  *RADIOLOGY REPORT*  Clinical Data: Right leg pain.  Leg instability.  RIGHT KNEE - COMPLETE 4+ VIEW  Comparison: None.  Findings: Osteoarthritis noted with loss of medial and lateral compartmental articular space, marginal medial and lateral compartmental spurring, and mild patellofemoral spurring.  On the lateral projection there appears to be a small knee effusion.  IMPRESSION:  1.  Moderate osteoarthritis of the knee. 2.  Small knee effusion.  Original Report Authenticated By: Dellia Cloud, M.D.     1. Dyspnea   2. CHF (congestive heart failure)   3. Acute systolic CHF (congestive heart failure)       MDM  Hip and knee xrays on right side show osteoarthritis but no acute abnormalities.  The patients Rib xray negative for fracture.  Pt tachy with pleuritic pain post fall, CT angio of chest ordered.   End of shift care handed off to Dr. Norlene Campbell. Awaiting CT angio of chest results for further management.        Dorthula Matas, PA 03/07/12 2236

## 2012-03-05 NOTE — ED Notes (Signed)
Pt c/o SOB since fall on Sunday.  Bruising to left chest.  Pain with inspiration.  Been to walkin clinic today, 1 breathing tx

## 2012-03-06 ENCOUNTER — Encounter (HOSPITAL_COMMUNITY): Payer: Self-pay | Admitting: Radiology

## 2012-03-06 ENCOUNTER — Emergency Department (HOSPITAL_COMMUNITY): Payer: Medicare Other

## 2012-03-06 DIAGNOSIS — E1169 Type 2 diabetes mellitus with other specified complication: Secondary | ICD-10-CM | POA: Diagnosis not present

## 2012-03-06 DIAGNOSIS — I5023 Acute on chronic systolic (congestive) heart failure: Secondary | ICD-10-CM | POA: Diagnosis not present

## 2012-03-06 DIAGNOSIS — E1165 Type 2 diabetes mellitus with hyperglycemia: Secondary | ICD-10-CM

## 2012-03-06 DIAGNOSIS — IMO0001 Reserved for inherently not codable concepts without codable children: Secondary | ICD-10-CM

## 2012-03-06 DIAGNOSIS — D649 Anemia, unspecified: Secondary | ICD-10-CM | POA: Diagnosis present

## 2012-03-06 DIAGNOSIS — I2510 Atherosclerotic heart disease of native coronary artery without angina pectoris: Secondary | ICD-10-CM | POA: Diagnosis not present

## 2012-03-06 DIAGNOSIS — Z853 Personal history of malignant neoplasm of breast: Secondary | ICD-10-CM | POA: Diagnosis not present

## 2012-03-06 DIAGNOSIS — R0989 Other specified symptoms and signs involving the circulatory and respiratory systems: Secondary | ICD-10-CM | POA: Diagnosis not present

## 2012-03-06 DIAGNOSIS — R0609 Other forms of dyspnea: Secondary | ICD-10-CM | POA: Diagnosis not present

## 2012-03-06 DIAGNOSIS — E876 Hypokalemia: Secondary | ICD-10-CM | POA: Diagnosis not present

## 2012-03-06 DIAGNOSIS — Z79899 Other long term (current) drug therapy: Secondary | ICD-10-CM | POA: Diagnosis not present

## 2012-03-06 DIAGNOSIS — E032 Hypothyroidism due to medicaments and other exogenous substances: Secondary | ICD-10-CM | POA: Diagnosis not present

## 2012-03-06 DIAGNOSIS — Z87891 Personal history of nicotine dependence: Secondary | ICD-10-CM | POA: Diagnosis not present

## 2012-03-06 DIAGNOSIS — J9819 Other pulmonary collapse: Secondary | ICD-10-CM | POA: Diagnosis not present

## 2012-03-06 DIAGNOSIS — E119 Type 2 diabetes mellitus without complications: Secondary | ICD-10-CM | POA: Diagnosis not present

## 2012-03-06 DIAGNOSIS — I509 Heart failure, unspecified: Secondary | ICD-10-CM

## 2012-03-06 DIAGNOSIS — I10 Essential (primary) hypertension: Secondary | ICD-10-CM | POA: Diagnosis not present

## 2012-03-06 DIAGNOSIS — M069 Rheumatoid arthritis, unspecified: Secondary | ICD-10-CM | POA: Diagnosis present

## 2012-03-06 DIAGNOSIS — IMO0002 Reserved for concepts with insufficient information to code with codable children: Secondary | ICD-10-CM | POA: Diagnosis not present

## 2012-03-06 DIAGNOSIS — E039 Hypothyroidism, unspecified: Secondary | ICD-10-CM | POA: Diagnosis present

## 2012-03-06 DIAGNOSIS — I5031 Acute diastolic (congestive) heart failure: Secondary | ICD-10-CM | POA: Diagnosis not present

## 2012-03-06 DIAGNOSIS — J90 Pleural effusion, not elsewhere classified: Secondary | ICD-10-CM | POA: Diagnosis not present

## 2012-03-06 DIAGNOSIS — I5021 Acute systolic (congestive) heart failure: Secondary | ICD-10-CM | POA: Diagnosis not present

## 2012-03-06 LAB — BASIC METABOLIC PANEL
CO2: 22 mEq/L (ref 19–32)
Calcium: 9.3 mg/dL (ref 8.4–10.5)
Creatinine, Ser: 1.01 mg/dL (ref 0.50–1.10)
GFR calc non Af Amer: 53 mL/min — ABNORMAL LOW (ref >90)
Sodium: 143 mEq/L (ref 135–145)

## 2012-03-06 LAB — CARDIAC PANEL(CRET KIN+CKTOT+MB+TROPI)
CK, MB: 5.9 ng/mL — ABNORMAL HIGH (ref 0.3–4.0)
CK, MB: 5.4 ng/mL — ABNORMAL HIGH (ref 0.3–4.0)
CK, MB: 4 ng/mL (ref 0.3–4.0)
Relative Index: 1 (ref 0.0–2.5)
Total CK: 581 U/L — ABNORMAL HIGH (ref 7–177)
Total CK: 293 U/L — ABNORMAL HIGH (ref 7–177)
Troponin I: <0.3 ng/mL (ref <0.30)
Troponin I: <0.3 ng/mL (ref <0.30)
Troponin I: <0.3 ng/mL (ref <0.30)

## 2012-03-06 LAB — CBC
HCT: 39.4 % (ref 36.0–46.0)
Hemoglobin: 13 g/dL (ref 12.0–15.0)
MCH: 30 pg (ref 26.0–34.0)
MCHC: 33 g/dL (ref 30.0–36.0)
MCV: 90.8 fL (ref 78.0–100.0)
RBC: 4.34 MIL/uL (ref 3.87–5.11)

## 2012-03-06 LAB — GLUCOSE, CAPILLARY
Glucose-Capillary: 95 mg/dL (ref 70–99)
Glucose-Capillary: 44 mg/dL — CL (ref 70–99)
Glucose-Capillary: 115 mg/dL — ABNORMAL HIGH (ref 70–99)

## 2012-03-06 LAB — PRO B NATRIURETIC PEPTIDE: Pro B Natriuretic peptide (BNP): 5292 pg/mL — ABNORMAL HIGH (ref 0–125)

## 2012-03-06 LAB — HEMOGLOBIN A1C: Hgb A1c MFr Bld: 5.3 % (ref <5.7)

## 2012-03-06 LAB — TSH: TSH: 0.999 u[IU]/mL (ref 0.350–4.500)

## 2012-03-06 MED ORDER — ZONISAMIDE 25 MG PO CAPS
25.0000 mg | ORAL_CAPSULE | Freq: Every day | ORAL | Status: DC
  Administered 2012-03-06 – 2012-03-08 (×3): 25 mg via ORAL
  Filled 2012-03-06 (×4): qty 1

## 2012-03-06 MED ORDER — SODIUM CHLORIDE 0.9 % IJ SOLN: 3.0000 mL | INTRAMUSCULAR | Status: DC | PRN

## 2012-03-06 MED ORDER — INSULIN ASPART 100 UNIT/ML ~~LOC~~ SOLN
0.0000 [IU] | Freq: Three times a day (TID) | SUBCUTANEOUS | Status: DC
  Administered 2012-03-06: 2 [IU] via SUBCUTANEOUS

## 2012-03-06 MED ORDER — LOSARTAN POTASSIUM 50 MG PO TABS
100.0000 mg | ORAL_TABLET | Freq: Every day | ORAL | Status: DC
  Administered 2012-03-06 – 2012-03-07 (×2): 100 mg via ORAL
  Filled 2012-03-06 (×2): qty 2

## 2012-03-06 MED ORDER — INSULIN ASPART 100 UNIT/ML ~~LOC~~ SOLN: 0.0000 [IU] | Freq: Every day | SUBCUTANEOUS | Status: DC

## 2012-03-06 MED ORDER — CARVEDILOL 3.125 MG PO TABS
3.1250 mg | ORAL_TABLET | Freq: Two times a day (BID) | ORAL | Status: DC
  Administered 2012-03-06 – 2012-03-08 (×3): 3.125 mg via ORAL
  Filled 2012-03-06 (×6): qty 1

## 2012-03-06 MED ORDER — FUROSEMIDE 10 MG/ML IJ SOLN
40.0000 mg | Freq: Every day | INTRAMUSCULAR | Status: DC
  Administered 2012-03-06: 40 mg via INTRAVENOUS
  Filled 2012-03-06: qty 4

## 2012-03-06 MED ORDER — DOCUSATE SODIUM 100 MG PO CAPS
100.0000 mg | ORAL_CAPSULE | Freq: Two times a day (BID) | ORAL | Status: DC
  Administered 2012-03-06 – 2012-03-09 (×7): 100 mg via ORAL
  Filled 2012-03-06 (×9): qty 1

## 2012-03-06 MED ORDER — LEFLUNOMIDE 10 MG PO TABS
10.0000 mg | ORAL_TABLET | Freq: Every day | ORAL | Status: DC
  Administered 2012-03-06 – 2012-03-09 (×4): 10 mg via ORAL
  Filled 2012-03-06 (×4): qty 1

## 2012-03-06 MED ORDER — BUPROPION HCL ER (SR) 150 MG PO TB12
150.0000 mg | ORAL_TABLET | Freq: Two times a day (BID) | ORAL | Status: DC
Start: 2012-03-06 — End: 2012-03-09
  Administered 2012-03-06 – 2012-03-09 (×7): 150 mg via ORAL
  Filled 2012-03-06 (×9): qty 1

## 2012-03-06 MED ORDER — GLUCOSE 40 % PO GEL
ORAL | Status: AC
  Administered 2012-03-06: 37.5 g
  Filled 2012-03-06: qty 1

## 2012-03-06 MED ORDER — SODIUM CHLORIDE 0.9 % IJ SOLN
3.0000 mL | Freq: Two times a day (BID) | INTRAMUSCULAR | Status: DC
  Administered 2012-03-07 – 2012-03-08 (×3): 3 mL via INTRAVENOUS
  Administered 2012-03-08: 11:00:00 via INTRAVENOUS
  Administered 2012-03-09: 3 mL via INTRAVENOUS

## 2012-03-06 MED ORDER — ALBUTEROL SULFATE (5 MG/ML) 0.5% IN NEBU
2.5000 mg | INHALATION_SOLUTION | Freq: Four times a day (QID) | RESPIRATORY_TRACT | Status: DC
  Filled 2012-03-06: qty 0.5

## 2012-03-06 MED ORDER — IOHEXOL 350 MG/ML SOLN
100.0000 mL | Freq: Once | INTRAVENOUS | Status: AC | PRN
  Administered 2012-03-06: 100 mL via INTRAVENOUS

## 2012-03-06 MED ORDER — ACETAMINOPHEN 325 MG PO TABS: 650.0000 mg | ORAL_TABLET | Freq: Four times a day (QID) | ORAL | Status: DC | PRN

## 2012-03-06 MED ORDER — INSULIN ASPART 100 UNIT/ML ~~LOC~~ SOLN
0.0000 [IU] | Freq: Three times a day (TID) | SUBCUTANEOUS | Status: DC
  Administered 2012-03-08: 2 [IU] via SUBCUTANEOUS
  Administered 2012-03-09: 5 [IU] via SUBCUTANEOUS

## 2012-03-06 MED ORDER — HYDROXYCHLOROQUINE SULFATE 200 MG PO TABS
200.0000 mg | ORAL_TABLET | Freq: Every day | ORAL | Status: DC
  Administered 2012-03-06 – 2012-03-09 (×4): 200 mg via ORAL
  Filled 2012-03-06 (×4): qty 1

## 2012-03-06 MED ORDER — LEVOTHYROXINE SODIUM 150 MCG PO TABS
150.0000 ug | ORAL_TABLET | Freq: Every day | ORAL | Status: DC
  Administered 2012-03-07 – 2012-03-09 (×3): 150 ug via ORAL
  Filled 2012-03-06 (×4): qty 1

## 2012-03-06 MED ORDER — MORPHINE SULFATE 2 MG/ML IJ SOLN: 2.0000 mg | INTRAMUSCULAR | Status: DC | PRN

## 2012-03-06 MED ORDER — DOXEPIN HCL 75 MG PO CAPS
150.0000 mg | ORAL_CAPSULE | Freq: Every day | ORAL | Status: DC
  Administered 2012-03-06 – 2012-03-08 (×3): 150 mg via ORAL
  Filled 2012-03-06 (×4): qty 2

## 2012-03-06 MED ORDER — VITAMIN D (ERGOCALCIFEROL) 1.25 MG (50000 UNIT) PO CAPS
50000.0000 [IU] | ORAL_CAPSULE | ORAL | Status: DC
  Administered 2012-03-07: 50000 [IU] via ORAL
  Filled 2012-03-06: qty 1

## 2012-03-06 MED ORDER — ASPIRIN EC 325 MG PO TBEC
325.0000 mg | DELAYED_RELEASE_TABLET | Freq: Every day | ORAL | Status: DC
  Administered 2012-03-06 – 2012-03-09 (×4): 325 mg via ORAL
  Filled 2012-03-06 (×4): qty 1

## 2012-03-06 MED ORDER — FUROSEMIDE 10 MG/ML IJ SOLN
40.0000 mg | Freq: Once | INTRAMUSCULAR | Status: AC
  Administered 2012-03-06: 40 mg via INTRAVENOUS
  Filled 2012-03-06: qty 4

## 2012-03-06 MED ORDER — TIOTROPIUM BROMIDE MONOHYDRATE 18 MCG IN CAPS
18.0000 ug | ORAL_CAPSULE | Freq: Every day | RESPIRATORY_TRACT | Status: DC
  Administered 2012-03-06 – 2012-03-09 (×4): 18 ug via RESPIRATORY_TRACT
  Filled 2012-03-06: qty 5

## 2012-03-06 MED ORDER — ALBUTEROL SULFATE HFA 108 (90 BASE) MCG/ACT IN AERS
2.0000 | INHALATION_SPRAY | RESPIRATORY_TRACT | Status: DC | PRN
  Administered 2012-03-06: 2 via RESPIRATORY_TRACT
  Filled 2012-03-06: qty 6.7

## 2012-03-06 MED ORDER — HYDROCODONE-ACETAMINOPHEN 5-325 MG PO TABS
1.0000 | ORAL_TABLET | ORAL | Status: DC | PRN
  Administered 2012-03-07 – 2012-03-08 (×2): 1 via ORAL
  Filled 2012-03-06 (×2): qty 1

## 2012-03-06 MED ORDER — ALBUTEROL SULFATE (5 MG/ML) 0.5% IN NEBU
2.5000 mg | INHALATION_SOLUTION | RESPIRATORY_TRACT | Status: DC | PRN
  Administered 2012-03-09: 2.5 mg via RESPIRATORY_TRACT
  Filled 2012-03-06: qty 0.5

## 2012-03-06 MED ORDER — POTASSIUM CHLORIDE CRYS ER 20 MEQ PO TBCR
40.0000 meq | EXTENDED_RELEASE_TABLET | Freq: Every day | ORAL | Status: DC
  Administered 2012-03-06: 40 meq via ORAL
  Filled 2012-03-06 (×3): qty 2

## 2012-03-06 MED ORDER — PREDNISONE 5 MG PO TABS
8.0000 mg | ORAL_TABLET | Freq: Every day | ORAL | Status: DC
  Administered 2012-03-07 – 2012-03-09 (×3): 8 mg via ORAL
  Filled 2012-03-06 (×4): qty 3

## 2012-03-06 MED ORDER — GLIMEPIRIDE 2 MG PO TABS
2.0000 mg | ORAL_TABLET | Freq: Every day | ORAL | Status: DC
  Administered 2012-03-08 – 2012-03-09 (×2): 2 mg via ORAL
  Filled 2012-03-06 (×4): qty 1

## 2012-03-06 MED ORDER — SODIUM CHLORIDE 0.9 % IV SOLN: 250.0000 mL | INTRAVENOUS | Status: DC | PRN

## 2012-03-06 MED ORDER — GLIMEPIRIDE 4 MG PO TABS
5.0000 mg | ORAL_TABLET | Freq: Two times a day (BID) | ORAL | Status: DC
  Administered 2012-03-06: 5 mg via ORAL
  Filled 2012-03-06 (×3): qty 1

## 2012-03-06 MED ORDER — ISOSORBIDE MONONITRATE ER 60 MG PO TB24
60.0000 mg | ORAL_TABLET | Freq: Every day | ORAL | Status: DC
  Administered 2012-03-06 – 2012-03-07 (×2): 60 mg via ORAL
  Filled 2012-03-06 (×2): qty 1

## 2012-03-06 MED ORDER — ENOXAPARIN SODIUM 40 MG/0.4ML ~~LOC~~ SOLN
40.0000 mg | Freq: Every day | SUBCUTANEOUS | Status: DC
  Administered 2012-03-06 – 2012-03-09 (×4): 40 mg via SUBCUTANEOUS
  Filled 2012-03-06 (×4): qty 0.4

## 2012-03-06 MED ORDER — FUROSEMIDE 10 MG/ML IJ SOLN
40.0000 mg | Freq: Two times a day (BID) | INTRAMUSCULAR | Status: DC
  Administered 2012-03-06 – 2012-03-07 (×3): 40 mg via INTRAVENOUS
  Filled 2012-03-06 (×5): qty 4

## 2012-03-06 MED ORDER — BACLOFEN 10 MG PO TABS
10.0000 mg | ORAL_TABLET | Freq: Two times a day (BID) | ORAL | Status: DC
  Administered 2012-03-06 – 2012-03-09 (×7): 10 mg via ORAL
  Filled 2012-03-06 (×8): qty 1

## 2012-03-06 MED ORDER — ACETAMINOPHEN 650 MG RE SUPP: 650.0000 mg | Freq: Four times a day (QID) | RECTAL | Status: DC | PRN

## 2012-03-06 MED ORDER — ETANERCEPT 25 MG ~~LOC~~ KIT: 50.0000 mg | PACK | SUBCUTANEOUS | Status: DC

## 2012-03-06 MED ORDER — SODIUM CHLORIDE 0.9 % IJ SOLN
3.0000 mL | Freq: Two times a day (BID) | INTRAMUSCULAR | Status: DC
  Administered 2012-03-06 (×2): 3 mL via INTRAVENOUS

## 2012-03-06 NOTE — Progress Notes (Signed)
  Echocardiogram 2D Echocardiogram has been performed.  Mieko Kneebone, Real Cons 03/06/2012, 2:42 PM

## 2012-03-06 NOTE — H&P (Signed)
PCP:   Allean Found, MD, MD   Chief Complaint: Dyspnea on exertion   HPI: Olivia Mullen is an 76 y.o. female with history of breast cancer, type 2 diabetes, hypothyroidism, rheumatoid arthritis, prior tobacco use, presents to the urgent care complaining of dyspnea and exertion and chest pain. Her chest pain is likely from her recent fall, with trauma directly to her chest. She has a large contusion over left breast and left flank. She does have however her progressive increased dyspnea and exertion. She was referred to [facility] cardiology by her primary care physician, but missed her appointment because of her migraine headache. At the urgent care, Chest x-ray  showed cardiomegaly and vascular congestion with pleural effusions. Her EKG showed left anterior fascicular block, sinus tachycardia at 110. She was subsequently referred to [facility] emergency room for further evaluation. A CT pulmonary angiogram was performed which showed no PE, no infiltrate, but with vascular congestion and cardiomegaly. It also has bilateral pleural effusions as well. Her laboratory study shows elevated pro BNP of 5200. She has total CPK of 500 with 6% MB fraction but negative troponin. She has a white count of 11,000 and hemoglobin of 12.3 g per decaliter. She was given nebulizer treatment without improvement, and hospitalist was asked to admit her for new onset of congestive heart failure.  Rewiew of Systems:  The patient denies anorexia, fever, weight loss,, vision loss, decreased hearing, hoarseness, , syncope, peripheral edema, balance deficits, hemoptysis, abdominal pain, melena, hematochezia, severe indigestion/heartburn, hematuria, incontinence, genital sores, muscle weakness, suspicious skin lesions, transient blindness, difficulty walking, depression, unusual weight change, abnormal bleeding, enlarged lymph nodes, angioedema, and breast masses.    Past Medical History  Diagnosis Date  . Rheumatoid  arthritis 12/16/2011  . Multinodular goiter (nontoxic) 12/16/2011  . Hypothyroidism (acquired) 12/16/2011  . DM type 2 (diabetes mellitus, type 2) 12/16/2011  . Normochromic normocytic anemia 12/16/2011  . Cancer     breast, 20 years ago    Past Surgical History  Procedure Date  . Mastectomy     right  . Carpal tunnel release   . Thyroid surgery   . Abdominal hysterectomy     Medications:  HOME MEDS: Prior to Admission medications   Medication Sig Start Date End Date Taking? Authorizing Provider  acetaminophen-codeine (TYLENOL #3) 300-30 MG per tablet Take 1 tablet by mouth 2 (two) times daily. As needed for discomfort   Yes Historical Provider, MD  baclofen (LIORESAL) 10 MG tablet Take 10 mg by mouth 2 (two) times daily. For headaches.   Yes Historical Provider, MD  buPROPion (WELLBUTRIN SR) 150 MG 12 hr tablet Take 150 mg by mouth 2 (two) times daily.   Yes Historical Provider, MD  doxepin (SINEQUAN) 150 MG capsule Take 150 mg by mouth at bedtime.   Yes Historical Provider, MD  etanercept (ENBREL) 25 MG injection Inject 50 mg into the skin once a week. Takes every thursday   Yes Historical Provider, MD  glimepiride (AMARYL) 4 MG tablet Take 5 mg by mouth 2 (two) times daily.   Yes Historical Provider, MD  hydroxychloroquine (PLAQUENIL) 200 MG tablet Take 200 mg by mouth daily. 2 tablets daily   Yes Historical Provider, MD  leflunomide (ARAVA) 10 MG tablet Take 10 mg by mouth daily.   Yes Historical Provider, MD  levothyroxine (SYNTHROID, LEVOTHROID) 150 MCG tablet Take 150 mcg by mouth daily.   Yes Historical Provider, MD  losartan (COZAAR) 100 MG tablet Take 100 mg by mouth  daily.   Yes Historical Provider, MD  metFORMIN (GLUCOPHAGE) 500 MG tablet Take 1,000 mg by mouth 2 (two) times daily with a meal.   Yes Historical Provider, MD  potassium chloride SA (K-DUR,KLOR-CON) 20 MEQ tablet Take 40 mEq by mouth daily.   Yes Historical Provider, MD  predniSONE (DELTASONE) 5 MG tablet Take 8  mg by mouth daily.   Yes Historical Provider, MD  tiotropium (SPIRIVA) 18 MCG inhalation capsule Place 18 mcg into inhaler and inhale daily. 1 puff daily   Yes Historical Provider, MD  Vitamin D, Ergocalciferol, (DRISDOL) 50000 UNITS CAPS Take 50,000 Units by mouth every 7 (seven) days. Patients takes twice a week on Tuesdays and Saturdays   Yes Historical Provider, MD  zonisamide (ZONEGRAN) 25 MG capsule Take 25 mg by mouth at bedtime.   Yes Historical Provider, MD     Allergies:  No Known Allergies  Social History:   reports that she has never smoked. She does not have any smokeless tobacco history on file. She reports that she drinks alcohol. Her drug history not on file.  Family History: History reviewed. No pertinent family history.   Physical Exam: Filed Vitals:   03/06/12 0400 03/06/12 0430 03/06/12 0500 03/06/12 0517  BP: 123/61 129/65 130/75   Pulse: 99 101 102   Temp:    97.9 F (36.6 C)  TempSrc:    Oral  Resp: 21 25 22    SpO2: 99% 98% 99%    Blood pressure 130/75, pulse 102, temperature 97.9 F (36.6 C), temperature source Oral, resp. rate 22, SpO2 99.00%.  GEN:  Pleasant  person lying in the stretcher in no acute distress; cooperative with exam PSYCH:  alert and oriented x4; does not appear anxious or depressed; affect is appropriate. HEENT: Mucous membranes pink and anicteric; PERRLA; EOM intact; no cervical lymphadenopathy nor thyromegaly or carotid bruit; no JVD; Breasts:: Not examined CHEST WALL: She had chest wall tenderness with contusion CHEST: Normal respiration, clear to auscultation bilaterally. No crackles no wheezes HEART: Regular rate and rhythm; no murmurs rubs or gallops BACK: No kyphosis or scoliosis; no CVA tenderness ABDOMEN: Obese, soft non-tender; no masses, no organomegaly, normal abdominal bowel sounds; no pannus; no intertriginous candida. Rectal Exam: Not done EXTREMITIES: No bone or joint deformity; she has arthritic changes in her  fingers and at the MTP joints; no edema; no ulcerations. Genitalia: not examined PULSES: 2+ and symmetric SKIN: Normal hydration no rash or ulceration CNS: Cranial nerves 2-12 grossly intact no focal lateralizing neurologic deficit   Labs & Imaging Results for orders placed during the hospital encounter of 03/05/12 (from the past 48 hour(s))  CBC     Status: Abnormal   Collection Time   03/05/12 11:43 PM      Component Value Range Comment   WBC 11.5 (*) 4.0 - 10.5 (K/uL)    RBC 4.12  3.87 - 5.11 (MIL/uL)    Hemoglobin 12.3  12.0 - 15.0 (g/dL)    HCT 69.6  29.5 - 28.4 (%)    MCV 92.2  78.0 - 100.0 (fL)    MCH 29.9  26.0 - 34.0 (pg)    MCHC 32.4  30.0 - 36.0 (g/dL)    RDW 13.2  44.0 - 10.2 (%)    Platelets 218  150 - 400 (K/uL)   BASIC METABOLIC PANEL     Status: Abnormal   Collection Time   03/05/12 11:43 PM      Component Value Range Comment   Sodium 143  135 - 145 (mEq/L)    Potassium 3.8  3.5 - 5.1 (mEq/L)    Chloride 109  96 - 112 (mEq/L)    CO2 22  19 - 32 (mEq/L)    Glucose, Bld 110 (*) 70 - 99 (mg/dL)    BUN 21  6 - 23 (mg/dL)    Creatinine, Ser 9.56  0.50 - 1.10 (mg/dL)    Calcium 9.3  8.4 - 10.5 (mg/dL)    GFR calc non Af Amer 53 (*) >90 (mL/min)    GFR calc Af Amer 62 (*) >90 (mL/min)   PRO B NATRIURETIC PEPTIDE     Status: Abnormal   Collection Time   03/06/12  3:34 AM      Component Value Range Comment   Pro B Natriuretic peptide (BNP) 5292.0 (*) 0 - 125 (pg/mL)   CARDIAC PANEL(CRET KIN+CKTOT+MB+TROPI)     Status: Abnormal   Collection Time   03/06/12  3:34 AM      Component Value Range Comment   Total CK 581 (*) 7 - 177 (U/L)    CK, MB 6.0 (*) 0.3 - 4.0 (ng/mL)    Troponin I <0.30  <0.30 (ng/mL)    Relative Index 1.0  0.0 - 2.5     Dg Ribs Unilateral W/chest Left  03/05/2012  *RADIOLOGY REPORT*  Clinical Data: Shortness of breath and left lower rib pain after fall.  BB marks the area of pain.  LEFT RIBS AND CHEST - 3+ VIEW  Comparison: 03/05/2012 at 1952  hours from the [facility].  Findings: Mild cardiac enlargement.  Calcified and tortuous aorta. No focal airspace consolidation in the lungs.  No blunting of costophrenic angles.  No pneumothorax.  Surgical clips in the right axilla and base of neck.  Left shoulder arthroplasty.  Focal irregularities in the anterior left seventh and eighth ribs without acute cortical change likely representing old fracture deformities.  No acute fracture or displacement demonstrated.  No focal bone lesion or expansile change.  IMPRESSION: No evidence of active pulmonary disease.  Cardiac enlargement.  No acute left rib fractures demonstrated.  Original Report Authenticated By: Marlon Pel, M.D.   Dg Hip Complete Right  03/05/2012  *RADIOLOGY REPORT*  Clinical Data: Right hip pain and instability.  RIGHT HIP - COMPLETE 2+ VIEW  Comparison: None.  Findings: Asymmetric and moderate to severe loss articular space noted in the right hip, particularly axially.  No erosion noted. There is only mild loss of articular space in the left hip. Ossific density along the right ischial tuberosity probably represents a chronically fragmented osteophyte.  There is spurring of the right femoral head.  No fracture or dislocation is observed.  IMPRESSION:  1.  Moderate to marked degenerative right hip arthropathy.  Mild left hip arthropathy. 2.  Suspected chronically fragmented osteophyte along the right hamstring origination.  Original Report Authenticated By: Dellia Cloud, M.D.   Ct Angio Chest W/cm &/or Wo Cm  03/06/2012  *RADIOLOGY REPORT*  Clinical Data: Shortness of breath with left-sided chest pain. Tachycardia.  Fall.  CT ANGIOGRAPHY CHEST  Technique:  Multidetector CT imaging of the chest using the standard protocol during bolus administration of intravenous contrast. Multiplanar reconstructed images including MIPs were obtained and reviewed to evaluate the vascular anatomy.  Contrast: OMNIPAQUE IOHEXOL 350  MG/ML SOLN  Comparison: None.  Findings: Technically adequate study with good opacification of the central and segmental pulmonary arteries.  No focal filling defects demonstrated.  No evidence of significant  pulmonary embolus. Cardiac enlargement with passive congestion of contrast material into the liver.  Normal caliber thoracic aorta with calcification. Prominent right paratracheal and anterior mediastinal fat.  No significant lymphadenopathy in the chest.  Esophagus is mostly decompressed.  The small bilateral pleural effusions.  Mild bilateral basilar atelectasis.  No focal airspace consolidation or interstitial changes.  Scattered calcified granulomas in the lungs. No pneumothorax.  Postoperative changes in the left shoulder.  No displaced rib fractures.  Mild degenerative changes in the thoracic spine.  Degenerative change in the right shoulder.  Previous thyroidectomy.  IMPRESSION: No evidence of significant pulmonary embolus.  Congestive changes in the heart lungs with cardiac enlargement, passive congestion of the liver, and small bilateral pleural effusions.  Mild bilateral basilar atelectasis.  Original Report Authenticated By: Marlon Pel, M.D.   Dg Knee Complete 4 Views Right  03/05/2012  *RADIOLOGY REPORT*  Clinical Data: Right leg pain.  Leg instability.  RIGHT KNEE - COMPLETE 4+ VIEW  Comparison: None.  Findings: Osteoarthritis noted with loss of medial and lateral compartmental articular space, marginal medial and lateral compartmental spurring, and mild patellofemoral spurring.  On the lateral projection there appears to be a small knee effusion.  IMPRESSION:  1.  Moderate osteoarthritis of the knee. 2.  Small knee effusion.  Original Report Authenticated By: Dellia Cloud, M.D.      Assessment Present on Admission:  .Breast cancer, stage 2 .DM type 2 (diabetes mellitus, type 2) .Rheumatoid arthritis .Multinodular goiter (nontoxic) .Hypothyroidism (acquired) .CHF  (congestive heart failure)   PLAN:  Will admit her to telemetry, continue to cycle her troponins and CPKs. Will do an echo for heart. Is possible that her pleural effusion is secondary to rheumatoid disease, but more likely she is volume overload. Will diurese her with daily IV Lasix. For her diabetes, we'll continue insulin sliding scales and provide carbohydrate modified diet. Will continue her DMARD medications for her rheumatoid arthritis. The heart failure could also be secondary to hypothyroidism, so will check TSH and continue supplements. Lastly, she'll need a nuclear stress test to exclude ischemic cardiomyopathy. She is stable, full code, and will be admitted to triad hospitalist service. Please consult [facility] as this was her outpatient plan for referral from her primary care physician already.   Other plans as per orders.    Edlyn Rosenburg 03/06/2012, 5:35 AM

## 2012-03-06 NOTE — Consult Note (Addendum)
Admit date: 03/05/2012   Referring Physician  Dr. Houston Siren Primary Physician  : Merri Brunette, MD Primary Cardiologist  Darci Needle, III, MD Reason for Consultation : Dyspnea  ASSESSMENT: 1. Acute on chronic heart failure, suspect systolic based on exam. CAD is the likely cause.  2. Mildly elevated ischemic markers, likely non-ST elevation MI type II  3. Diabetes mellitus, type II  4. Multinodular goiter with hypothyroidism  5. History of breast cancer, 20 years ago  6. Rheumatoid arthritis  PLAN:  1. 2-D echocardiogram to assess LV size and function.  2. The patient will need an ischemic evaluation given her diabetes and new-onset heart failure. I would like to have heart failure under control before proceeding to cath. We will likely optimize heart failure therapy in hospital and do cath as an OP in several weeks once medically more stable, assuming all goes well. We will have to find the appropriate oral diuretic dose prior to discharge. She was on furosemide 20 mg as an OP.  3. Institute beta blocker and long-acting nitrate therapy.  4. ECG   HPI: The patient was admitted to the hospital with a several week history of gradual developing dyspnea on exertion and mild orthopnea. A recent fall led to chest wall trauma and yesterday when she went to the urgent care Center was also complaining of chest pain. On evaluation, CT scan did not reveal pulmonary embolism but did demonstrate cardiac enlargement and pulmonary congestion. A subsequent BMP was 5500. There is no prior history of heart disease.   PMH:   Past Medical History  Diagnosis Date  . Rheumatoid arthritis 12/16/2011  . Multinodular goiter (nontoxic) 12/16/2011  . Hypothyroidism (acquired) 12/16/2011  . DM type 2 (diabetes mellitus, type 2) 12/16/2011  . Normochromic normocytic anemia 12/16/2011  . Cancer     breast, 20 years ago  . Shortness of breath   . Anemia   . Headache   . CHF (congestive heart  failure)      PSH:   Past Surgical History  Procedure Date  . Mastectomy     right  . Carpal tunnel release   . Thyroid surgery   . Abdominal hysterectomy   . Left shoulder     Allergies:  Review of patient's allergies indicates no known allergies. Prior to Admit Meds:   Prescriptions prior to admission  Medication Sig Dispense Refill  . acetaminophen-codeine (TYLENOL #3) 300-30 MG per tablet Take 1 tablet by mouth 2 (two) times daily. As needed for discomfort      . baclofen (LIORESAL) 10 MG tablet Take 10 mg by mouth 2 (two) times daily. For headaches.      Marland Kitchen buPROPion (WELLBUTRIN SR) 150 MG 12 hr tablet Take 150 mg by mouth 2 (two) times daily.      Marland Kitchen doxepin (SINEQUAN) 150 MG capsule Take 150 mg by mouth at bedtime.      Marland Kitchen etanercept (ENBREL) 25 MG injection Inject 50 mg into the skin once a week. Takes every thursday      . glimepiride (AMARYL) 4 MG tablet Take 5 mg by mouth 2 (two) times daily.      . hydroxychloroquine (PLAQUENIL) 200 MG tablet Take 200 mg by mouth daily. 2 tablets daily      . leflunomide (ARAVA) 10 MG tablet Take 10 mg by mouth daily.      Marland Kitchen levothyroxine (SYNTHROID, LEVOTHROID) 150 MCG tablet Take 150 mcg by mouth daily.      Marland Kitchen  losartan (COZAAR) 100 MG tablet Take 100 mg by mouth daily.      . metFORMIN (GLUCOPHAGE) 500 MG tablet Take 1,000 mg by mouth 2 (two) times daily with a meal.      . potassium chloride SA (K-DUR,KLOR-CON) 20 MEQ tablet Take 40 mEq by mouth daily.      . predniSONE (DELTASONE) 5 MG tablet Take 8 mg by mouth daily.      Marland Kitchen tiotropium (SPIRIVA) 18 MCG inhalation capsule Place 18 mcg into inhaler and inhale daily. 1 puff daily      . Vitamin D, Ergocalciferol, (DRISDOL) 50000 UNITS CAPS Take 50,000 Units by mouth every 7 (seven) days. Patients takes twice a week on Tuesdays and Saturdays      . zonisamide (ZONEGRAN) 25 MG capsule Take 25 mg by mouth at bedtime.       Fam HX:   History reviewed. No pertinent family history. Social HX:      History   Social History  . Marital Status: Married    Spouse Name: N/A    Number of Children: N/A  . Years of Education: N/A   Occupational History  . Not on file.   Social History Main Topics  . Smoking status: Former Games developer  . Smokeless tobacco: Never Used  . Alcohol Use: Yes     occasional  . Drug Use: No  . Sexually Active: No   Other Topics Concern  . Not on file   Social History Narrative  . No narrative on file     Review of Systems: The patient has developed shortness of breath gradually over the past 4-6 weeks. 4-6 weeks ago she noticed exertional central chest pressure with exertion and was associated with dyspnea on exertion. She has had wheezing. She has not noted edema. She has not had palpitations or syncope. There is no preceding heart history. There is no preceding the lung history. She's never had a stroke. She's been diabetic for many years.  Physical Exam: Blood pressure 130/68, pulse 105, temperature 97.9 F (36.6 C), temperature source Oral, resp. rate 22, weight 80.1 kg (176 lb 9.4 oz), SpO2 95.00%. Weight change:   Obese accountable lying flat on her bed.  Difficult to assess neck veins. No carotid bruits are heard.  Basal rales are heard. No wheezing.  And S3 gallop is heard on auscultation. No obvious murmur is heard. Heart sounds are distant.  Abdomen is obese and nontender.  No arms or legs reveal no edema. Pulses are 2+.  Neuro exam is unremarkable. She does appear to have some memory issues. Labs:   Lab Results  Component Value Date   WBC 12.1* 03/06/2012   HGB 13.0 03/06/2012   HCT 39.4 03/06/2012   MCV 90.8 03/06/2012   PLT 210 03/06/2012    Lab 03/06/12 0920 03/05/12 2343  NA -- 143  K -- 3.8  CL -- 109  CO2 -- 22  BUN -- 21  CREATININE 0.94 --  CALCIUM -- 9.3  PROT -- --  BILITOT -- --  ALKPHOS -- --  ALT -- --  AST -- --  GLUCOSE -- 110*   No results found for this basename: PTT   No results found for this  basename: INR, PROTIME   Lab Results  Component Value Date   CKTOTAL 549* 03/06/2012   CKMB 5.9* 03/06/2012   TROPONINI <0.30 03/06/2012     BNP    Component Value Date/Time   PROBNP 5292.0* 03/06/2012 1191  Radiology:  Dg Ribs Unilateral W/chest Left  03/05/2012  *RADIOLOGY REPORT*  Clinical Data: Shortness of breath and left lower rib pain after fall.  BB marks the area of pain.  LEFT RIBS AND CHEST - 3+ VIEW  Comparison: 03/05/2012 at 1952 hours from the [facility] Medicine.  Findings: Mild cardiac enlargement.  Calcified and tortuous aorta. No focal airspace consolidation in the lungs.  No blunting of costophrenic angles.  No pneumothorax.  Surgical clips in the right axilla and base of neck.  Left shoulder arthroplasty.  Focal irregularities in the anterior left seventh and eighth ribs without acute cortical change likely representing old fracture deformities.  No acute fracture or displacement demonstrated.  No focal bone lesion or expansile change.  IMPRESSION: No evidence of active pulmonary disease.  Cardiac enlargement.  No acute left rib fractures demonstrated.  Original Report Authenticated By: Marlon Pel, M.D.   Dg Hip Complete Right  03/05/2012  *RADIOLOGY REPORT*  Clinical Data: Right hip pain and instability.  RIGHT HIP - COMPLETE 2+ VIEW  Comparison: None.  Findings: Asymmetric and moderate to severe loss articular space noted in the right hip, particularly axially.  No erosion noted. There is only mild loss of articular space in the left hip. Ossific density along the right ischial tuberosity probably represents a chronically fragmented osteophyte.  There is spurring of the right femoral head.  No fracture or dislocation is observed.  IMPRESSION:  1.  Moderate to marked degenerative right hip arthropathy.  Mild left hip arthropathy. 2.  Suspected chronically fragmented osteophyte along the right hamstring origination.  Original Report Authenticated By: Dellia Cloud, M.D.   Ct Angio Chest W/cm &/or Wo Cm  03/06/2012  *RADIOLOGY REPORT*  Clinical Data: Shortness of breath with left-sided chest pain. Tachycardia.  Fall.  CT ANGIOGRAPHY CHEST  Technique:  Multidetector CT imaging of the chest using the standard protocol during bolus administration of intravenous contrast. Multiplanar reconstructed images including MIPs were obtained and reviewed to evaluate the vascular anatomy.  Contrast: OMNIPAQUE IOHEXOL 350 MG/ML SOLN  Comparison: None.  Findings: Technically adequate study with good opacification of the central and segmental pulmonary arteries.  No focal filling defects demonstrated.  No evidence of significant pulmonary embolus. Cardiac enlargement with passive congestion of contrast material into the liver.  Normal caliber thoracic aorta with calcification. Prominent right paratracheal and anterior mediastinal fat.  No significant lymphadenopathy in the chest.  Esophagus is mostly decompressed.  The small bilateral pleural effusions.  Mild bilateral basilar atelectasis.  No focal airspace consolidation or interstitial changes.  Scattered calcified granulomas in the lungs. No pneumothorax.  Postoperative changes in the left shoulder.  No displaced rib fractures.  Mild degenerative changes in the thoracic spine.  Degenerative change in the right shoulder.  Previous thyroidectomy.  IMPRESSION: No evidence of significant pulmonary embolus.  Congestive changes in the heart lungs with cardiac enlargement, passive congestion of the liver, and small bilateral pleural effusions.  Mild bilateral basilar atelectasis.  Original Report Authenticated By: Marlon Pel, M.D.   Dg Knee Complete 4 Views Right  03/05/2012  *RADIOLOGY REPORT*  Clinical Data: Right leg pain.  Leg instability.  RIGHT KNEE - COMPLETE 4+ VIEW  Comparison: None.  Findings: Osteoarthritis noted with loss of medial and lateral compartmental articular space, marginal medial and lateral  compartmental spurring, and mild patellofemoral spurring.  On the lateral projection there appears to be a small knee effusion.  IMPRESSION:  1.  Moderate osteoarthritis of  the knee. 2.  Small knee effusion.  Original Report Authenticated By: Dellia Cloud, M.D.   EKG:   No tracing available.    Lesleigh Noe 03/06/2012 1:18 PM

## 2012-03-06 NOTE — Progress Notes (Signed)
UR Completed. Starasia Sinko, RN, Nurse Case Manager [phone]     

## 2012-03-06 NOTE — Progress Notes (Signed)
I have seen and examined the patient admitted this a.m. by Dr. Conley Rolls with dyspnea exertion, findings consistent with a CHF. She states her breathing is better but still short of breath with minimal exertion. She denies chest pain cardiac enzymes so far negative. I have consulted cardiology, [facility] for further evaluation and recommendations/risk stratification. Will continue diuresis with Lasix and current management as per Dr. Conley Rolls. Patient has also had hypoglycemic episodes today-will decrease Amaryl, change sliding scale to sensitive and follow.  Olivia Mullen [phone]

## 2012-03-06 NOTE — ED Notes (Signed)
Ambulated pt sat 100% heart rate 109

## 2012-03-06 NOTE — ED Provider Notes (Signed)
Medical screening examination/treatment/procedure(s) were conducted as a shared visit with non-physician practitioner(s) and myself.  I personally evaluated the patient during the encounter. Patient with shortness of breath ongoing for the last several weeks to months. Patient was to have evaluation by cardiology outpatient, but missed this appointment due to migraine that lasted for 6 weeks. Patient has been seen by [facility], received injections and reports her headache is finally resolved. Husband reports patient is only able to walk 8-10 feet before she gets short of breath. Patient had fall last weekend landing on her anterior chest. Since that time she has had worsening shortness of breath and pain. Patient was seen at walk-in clinic today, had breathing treatment and chest x-ray. She was sent to the emergency department due to concern for cardiomegaly seen on chest x-ray. No swelling of the legs, no history of congestive heart failure. Patient has had adjustment in her psychiatric/depression medicines over the last few weeks. Husband and wife are concerned as to the etiology of her chest pain. CT Angio chest shows no PE. There is congestive changes in the heart with cardiomegaly and congestion of the liver was small pleural effusions. Patient reports she is unable to lay flat due to 2 shortness of breath and a feeling that she is starving for air. Patient does not wear oxygen at home. Patient reports she is using her albuterol inhaler more than she should to get relief of her shortness of breath. 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 Will check cardiac markers and BNP given concern for congestive heart failure. If patient has desaturation during ambulation, she may need admission to the hospital for management of her dyspnea  Olivia Mackie, MD 03/06/12 [phone]

## 2012-03-06 NOTE — ED Notes (Signed)
Respiratory called of incentive spiormeter

## 2012-03-07 DIAGNOSIS — I509 Heart failure, unspecified: Secondary | ICD-10-CM | POA: Diagnosis not present

## 2012-03-07 DIAGNOSIS — E1169 Type 2 diabetes mellitus with other specified complication: Secondary | ICD-10-CM

## 2012-03-07 DIAGNOSIS — I5021 Acute systolic (congestive) heart failure: Secondary | ICD-10-CM

## 2012-03-07 DIAGNOSIS — E876 Hypokalemia: Secondary | ICD-10-CM

## 2012-03-07 LAB — CBC
Platelets: 199 10*3/uL (ref 150–400)
RBC: 4.36 MIL/uL (ref 3.87–5.11)
RDW: 14.5 % (ref 11.5–15.5)
WBC: 7.9 10*3/uL (ref 4.0–10.5)

## 2012-03-07 LAB — BASIC METABOLIC PANEL
Calcium: 9.5 mg/dL (ref 8.4–10.5)
Creatinine, Ser: 0.98 mg/dL (ref 0.50–1.10)
GFR calc Af Amer: 64 mL/min — ABNORMAL LOW (ref >90)
Sodium: 143 mEq/L (ref 135–145)

## 2012-03-07 LAB — GLUCOSE, CAPILLARY: Glucose-Capillary: 44 mg/dL — CL (ref 70–99)

## 2012-03-07 LAB — MAGNESIUM: Magnesium: 1.8 mg/dL (ref 1.5–2.5)

## 2012-03-07 MED ORDER — POTASSIUM CHLORIDE CRYS ER 20 MEQ PO TBCR
40.0000 meq | EXTENDED_RELEASE_TABLET | Freq: Once | ORAL | Status: AC
  Administered 2012-03-07: 40 meq via ORAL
  Filled 2012-03-07: qty 2

## 2012-03-07 MED ORDER — LOSARTAN POTASSIUM 50 MG PO TABS
50.0000 mg | ORAL_TABLET | Freq: Every day | ORAL | Status: DC
  Administered 2012-03-08 – 2012-03-09 (×2): 50 mg via ORAL
  Filled 2012-03-07 (×2): qty 1

## 2012-03-07 MED ORDER — ISOSORBIDE MONONITRATE ER 30 MG PO TB24
30.0000 mg | ORAL_TABLET | Freq: Every day | ORAL | Status: DC
  Administered 2012-03-08 – 2012-03-09 (×2): 30 mg via ORAL
  Filled 2012-03-07 (×2): qty 1

## 2012-03-07 MED ORDER — POTASSIUM CHLORIDE 10 MEQ/100ML IV SOLN
10.0000 meq | INTRAVENOUS | Status: AC
  Administered 2012-03-07: 10 meq via INTRAVENOUS
  Filled 2012-03-07 (×4): qty 100

## 2012-03-07 MED ORDER — POTASSIUM CHLORIDE CRYS ER 20 MEQ PO TBCR
40.0000 meq | EXTENDED_RELEASE_TABLET | Freq: Two times a day (BID) | ORAL | Status: DC
  Administered 2012-03-07 – 2012-03-09 (×5): 40 meq via ORAL
  Filled 2012-03-07 (×5): qty 2

## 2012-03-07 NOTE — Progress Notes (Signed)
Pt unable to tolerate KCL gtt after new IV started. Stated burning up and around lt arm stopped IV KCL and notified MD, new orders received.

## 2012-03-07 NOTE — Progress Notes (Addendum)
Subjective: stll with SOB, though states better than when she came in. Husband at bedside Objective: Vital signs in last 24 hours: Temp:  [97.5 F (36.4 C)-98.4 F (36.9 C)] 97.5 F (36.4 C) (05/18 0628) Pulse Rate:  [93-98] 97  (05/18 0628) Resp:  [18-20] 20  (05/18 0628) BP: (123-130)/(58-78) 127/72 mmHg (05/18 0628) SpO2:  [95 %-98 %] 97 % (05/18 0920) Weight:  [82.872 kg (182 lb 11.2 oz)] 82.872 kg (182 lb 11.2 oz) (05/18 0628) Last BM Date: 03/05/12 Intake/Output from previous day: 05/17 0701 - 05/18 0700 In: 849 [P.O.:845; IV Piggyback:4] Out: 2900 [Urine:2900] Intake/Output this shift: Total I/O In: 220 [P.O.:220] Out: -     General Appearance:    Alert, cooperative, no distress, appears stated age  Lungs:     Basilar crackles, respirations unlabored   Heart:    Regular rate and rhythm, S1 and S2 normal, no murmur, rub   or gallop  Abdomen:     Soft, non-tender, bowel sounds active all four quadrants,    no masses, no organomegaly  Extremities:   Extremities normal, atraumatic, no cyanosis or edema  Neurologic:   CNII-XII intact, normal strength, sensation and reflexes    throughout    Weight change: 2.772 kg (6 lb 1.8 oz)  Intake/Output Summary (Last 24 hours) at 03/07/12 1219 Last data filed at 03/07/12 0800  Gross per 24 hour  Intake    709 ml  Output   2050 ml  Net  -1341 ml    Lab Results:   Basename 03/07/12 0500 03/06/12 0920 03/05/12 2343  NA 143 -- 143  K 2.6* -- 3.8  CL 100 -- 109  CO2 28 -- 22  GLUCOSE 56* -- 110*  BUN 20 -- 21  CREATININE 0.98 0.94 --  CALCIUM 9.5 -- 9.3    Basename 03/07/12 0500 03/06/12 0920  WBC 7.9 12.1*  HGB 13.0 13.0  HCT 40.0 39.4  PLT 199 210  MCV 91.7 90.8   PT/INR No results found for this basename: LABPROT:2,INR:2 in the last 72 hours ABG No results found for this basename: PHART:2,PCO2:2,PO2:2,HCO3:2 in the last 72 hours  Micro Results: No results found for this or any previous visit (from the  past 240 hour(s)). Studies/Results: Dg Ribs Unilateral W/chest Left  03/05/2012  *RADIOLOGY REPORT*  Clinical Data: Shortness of breath and left lower rib pain after fall.  BB marks the area of pain.  LEFT RIBS AND CHEST - 3+ VIEW  Comparison: 03/05/2012 at 1952 hours from the [facility].  Findings: Mild cardiac enlargement.  Calcified and tortuous aorta. No focal airspace consolidation in the lungs.  No blunting of costophrenic angles.  No pneumothorax.  Surgical clips in the right axilla and base of neck.  Left shoulder arthroplasty.  Focal irregularities in the anterior left seventh and eighth ribs without acute cortical change likely representing old fracture deformities.  No acute fracture or displacement demonstrated.  No focal bone lesion or expansile change.  IMPRESSION: No evidence of active pulmonary disease.  Cardiac enlargement.  No acute left rib fractures demonstrated.  Original Report Authenticated By: Marlon Pel, M.D.   Dg Hip Complete Right  03/05/2012  *RADIOLOGY REPORT*  Clinical Data: Right hip pain and instability.  RIGHT HIP - COMPLETE 2+ VIEW  Comparison: None.  Findings: Asymmetric and moderate to severe loss articular space noted in the right hip, particularly axially.  No erosion noted. There is only mild loss of articular space in the left hip.  Ossific density along the right ischial tuberosity probably represents a chronically fragmented osteophyte.  There is spurring of the right femoral head.  No fracture or dislocation is observed.  IMPRESSION:  1.  Moderate to marked degenerative right hip arthropathy.  Mild left hip arthropathy. 2.  Suspected chronically fragmented osteophyte along the right hamstring origination.  Original Report Authenticated By: Dellia Cloud, M.D.   Ct Angio Chest W/cm &/or Wo Cm  03/06/2012  *RADIOLOGY REPORT*  Clinical Data: Shortness of breath with left-sided chest pain. Tachycardia.  Fall.  CT ANGIOGRAPHY CHEST  Technique:   Multidetector CT imaging of the chest using the standard protocol during bolus administration of intravenous contrast. Multiplanar reconstructed images including MIPs were obtained and reviewed to evaluate the vascular anatomy.  Contrast: OMNIPAQUE IOHEXOL 350 MG/ML SOLN  Comparison: None.  Findings: Technically adequate study with good opacification of the central and segmental pulmonary arteries.  No focal filling defects demonstrated.  No evidence of significant pulmonary embolus. Cardiac enlargement with passive congestion of contrast material into the liver.  Normal caliber thoracic aorta with calcification. Prominent right paratracheal and anterior mediastinal fat.  No significant lymphadenopathy in the chest.  Esophagus is mostly decompressed.  The small bilateral pleural effusions.  Mild bilateral basilar atelectasis.  No focal airspace consolidation or interstitial changes.  Scattered calcified granulomas in the lungs. No pneumothorax.  Postoperative changes in the left shoulder.  No displaced rib fractures.  Mild degenerative changes in the thoracic spine.  Degenerative change in the right shoulder.  Previous thyroidectomy.  IMPRESSION: No evidence of significant pulmonary embolus.  Congestive changes in the heart lungs with cardiac enlargement, passive congestion of the liver, and small bilateral pleural effusions.  Mild bilateral basilar atelectasis.  Original Report Authenticated By: Marlon Pel, M.D.   Dg Knee Complete 4 Views Right  03/05/2012  *RADIOLOGY REPORT*  Clinical Data: Right leg pain.  Leg instability.  RIGHT KNEE - COMPLETE 4+ VIEW  Comparison: None.  Findings: Osteoarthritis noted with loss of medial and lateral compartmental articular space, marginal medial and lateral compartmental spurring, and mild patellofemoral spurring.  On the lateral projection there appears to be a small knee effusion.  IMPRESSION:  1.  Moderate osteoarthritis of the knee. 2.  Small knee effusion.   Original Report Authenticated By: Dellia Cloud, M.D.   Medications:} Scheduled Meds:   . aspirin EC  325 mg Oral Daily  . baclofen  10 mg Oral BID  . buPROPion  150 mg Oral BID WC  . carvedilol  3.125 mg Oral BID WC  . dextrose      . docusate sodium  100 mg Oral BID  . doxepin  150 mg Oral QHS  . enoxaparin  40 mg Subcutaneous Daily  . etanercept  50 mg Subcutaneous Weekly  . furosemide  40 mg Intravenous Q12H  . glimepiride  2 mg Oral Q breakfast  . hydroxychloroquine  200 mg Oral Daily  . insulin aspart  0-9 Units Subcutaneous TID WC  . isosorbide mononitrate  60 mg Oral Daily  . leflunomide  10 mg Oral Daily  . levothyroxine  150 mcg Oral Q breakfast  . losartan  100 mg Oral Daily  . potassium chloride  10 mEq Intravenous Q1 Hr x 4  . potassium chloride  40 mEq Oral BID  . predniSONE  8 mg Oral Q breakfast  . sodium chloride  3 mL Intravenous Q12H  . tiotropium  18 mcg Inhalation Daily  . Vitamin  D (Ergocalciferol)  50,000 Units Oral Custom  . zonisamide  25 mg Oral QHS  . DISCONTD: furosemide  40 mg Intravenous Daily  . DISCONTD: glimepiride  5 mg Oral BID WC  . DISCONTD: insulin aspart  0-15 Units Subcutaneous TID WC  . DISCONTD: insulin aspart  0-5 Units Subcutaneous QHS  . DISCONTD: potassium chloride SA  40 mEq Oral Daily  . DISCONTD: sodium chloride  3 mL Intravenous Q12H   Continuous Infusions:  PRN Meds:.sodium chloride, acetaminophen, acetaminophen, albuterol, HYDROcodone-acetaminophen, morphine injection, sodium chloride Assessment/Plan: Principal Problem:  *Acute systolic CHF (congestive heart failure) -Continue diuresis with iv lasix, ARB,coreg and long acting nitrates added per cards -appreciate cardiology assistance Mildly Elevated Cks- Troponins normal -B-Blocker and nitrates as above, CP free -per cards planning outpatient cath  Hypothyroidism (acquired) -continue synthroid  DM type 2 with hypoglycemic episodes (diabetes mellitus, type  2) -less hypoglycemia on decreased amaryl and sensitive scale insulin h/o Breast cancer, stage 2  Rheumatoid arthritis  -continue outpt meds h/o Multinodular goiter (nontoxic) Hypokalemia -replace k   LOS: 2 days   Olivia Mullen C 03/07/2012, 12:19 PM

## 2012-03-07 NOTE — Progress Notes (Signed)
CRITICAL VALUE ALERT  Critical value received:  Potassium 2.6  Date of notification:  03/07/2012  Time of notification: 0645  Critical value read back: yes  Nurse who received alert:  Lavell Islam  MD notified (1st page):  Claiborne Billings, NP  Time of first page: 0650  MD notified (2nd page):  Time of second page:  Responding MD:  Claiborne Billings, NP  Time MD responded:  [phone]

## 2012-03-07 NOTE — Progress Notes (Signed)
CRITICAL VALUE ALERT  Critical value received:  BS of 36 mg/dl (1st check), 44 mg/dl on recheck  Date of notification:  03/07/2012  Time of notification:  0645  Critical value read back: no  Nurse who received alert:  Korissa Horsford  MD notified (1st page): Claiborne Billings, NP  Time of first page:  0650  MD notified (2nd page):  Time of second page:  Responding MD:  Claiborne Billings, NP  Time MD responded: [phone]

## 2012-03-07 NOTE — Progress Notes (Signed)
Assesment done at 0800 placed at wrong time

## 2012-03-07 NOTE — Progress Notes (Signed)
Patient ID: Olivia Mullen, female   DOB: [birth year], 76 y.o.   MRN: [id number] Subjective:  C/O dyspnea. No chest pain.   Objective:  Vital Signs in the last 24 hours: Temp:  [97.5 F (36.4 C)-98.4 F (36.9 C)] 97.5 F (36.4 C) (05/18 0628) Pulse Rate:  [93-98] 97  (05/18 0628) Resp:  [18-20] 20  (05/18 0628) BP: (123-130)/(58-78) 127/72 mmHg (05/18 0628) SpO2:  [95 %-98 %] 98 % (05/18 0628) Weight:  [182 lb 11.2 oz (82.872 kg)] 182 lb 11.2 oz (82.872 kg) (05/18 0628)  Intake/Output from previous day: 05/17 0701 - 05/18 0700 In: 849 [P.O.:845; IV Piggyback:4] Out: 2900 [Urine:2900] Intake/Output from this shift:    Physical Exam: Anxious appearing NAD HEENT: Unremarkable Neck:  8 cm JVD, no thyromegally Lymphatics:  No adenopathy Back:  No CVA tenderness Lungs:  Clear except for rales 1/4 up bilaterally HEART:  Regular rate rhythm, no murmurs, no rubs, no clicks, distant Abd:  obese, positive bowel sounds, no organomegally, no rebound, no guarding Ext:  2 plus pulses, no edema, no cyanosis, no clubbing Skin:  No rashes no nodules Neuro:  CN II through XII intact, motor grossly intact  Lab Results:  Basename 03/07/12 0500 03/06/12 0920  WBC 7.9 12.1*  HGB 13.0 13.0  PLT 199 210    Basename 03/07/12 0500 03/06/12 0920 03/05/12 2343  NA 143 -- 143  K 2.6* -- 3.8  CL 100 -- 109  CO2 28 -- 22  GLUCOSE 56* -- 110*  BUN 20 -- 21  CREATININE 0.98 0.94 --    Basename 03/06/12 2300 03/06/12 1535  TROPONINI <0.30 <0.30   Hepatic Function Panel No results found for this basename: PROT,ALBUMIN,AST,ALT,ALKPHOS,BILITOT,BILIDIR,IBILI in the last 72 hours No results found for this basename: CHOL in the last 72 hours No results found for this basename: PROTIME in the last 72 hours  Imaging: Dg Ribs Unilateral W/chest Left  03/05/2012  *RADIOLOGY REPORT*  Clinical Data: Shortness of breath and left lower rib pain after fall.  BB marks the area of pain.  LEFT RIBS AND CHEST  - 3+ VIEW  Comparison: 03/05/2012 at 1952 hours from the [facility].  Findings: Mild cardiac enlargement.  Calcified and tortuous aorta. No focal airspace consolidation in the lungs.  No blunting of costophrenic angles.  No pneumothorax.  Surgical clips in the right axilla and base of neck.  Left shoulder arthroplasty.  Focal irregularities in the anterior left seventh and eighth ribs without acute cortical change likely representing old fracture deformities.  No acute fracture or displacement demonstrated.  No focal bone lesion or expansile change.  IMPRESSION: No evidence of active pulmonary disease.  Cardiac enlargement.  No acute left rib fractures demonstrated.  Original Report Authenticated By: Marlon Pel, M.D.   Dg Hip Complete Right  03/05/2012  *RADIOLOGY REPORT*  Clinical Data: Right hip pain and instability.  RIGHT HIP - COMPLETE 2+ VIEW  Comparison: None.  Findings: Asymmetric and moderate to severe loss articular space noted in the right hip, particularly axially.  No erosion noted. There is only mild loss of articular space in the left hip. Ossific density along the right ischial tuberosity probably represents a chronically fragmented osteophyte.  There is spurring of the right femoral head.  No fracture or dislocation is observed.  IMPRESSION:  1.  Moderate to marked degenerative right hip arthropathy.  Mild left hip arthropathy. 2.  Suspected chronically fragmented osteophyte along the right hamstring origination.  Original Report Authenticated  By: Dellia Cloud, M.D.   Ct Angio Chest W/cm &/or Wo Cm  03/06/2012  *RADIOLOGY REPORT*  Clinical Data: Shortness of breath with left-sided chest pain. Tachycardia.  Fall.  CT ANGIOGRAPHY CHEST  Technique:  Multidetector CT imaging of the chest using the standard protocol during bolus administration of intravenous contrast. Multiplanar reconstructed images including MIPs were obtained and reviewed to evaluate the vascular  anatomy.  Contrast: OMNIPAQUE IOHEXOL 350 MG/ML SOLN  Comparison: None.  Findings: Technically adequate study with good opacification of the central and segmental pulmonary arteries.  No focal filling defects demonstrated.  No evidence of significant pulmonary embolus. Cardiac enlargement with passive congestion of contrast material into the liver.  Normal caliber thoracic aorta with calcification. Prominent right paratracheal and anterior mediastinal fat.  No significant lymphadenopathy in the chest.  Esophagus is mostly decompressed.  The small bilateral pleural effusions.  Mild bilateral basilar atelectasis.  No focal airspace consolidation or interstitial changes.  Scattered calcified granulomas in the lungs. No pneumothorax.  Postoperative changes in the left shoulder.  No displaced rib fractures.  Mild degenerative changes in the thoracic spine.  Degenerative change in the right shoulder.  Previous thyroidectomy.  IMPRESSION: No evidence of significant pulmonary embolus.  Congestive changes in the heart lungs with cardiac enlargement, passive congestion of the liver, and small bilateral pleural effusions.  Mild bilateral basilar atelectasis.  Original Report Authenticated By: Marlon Pel, M.D.   Dg Knee Complete 4 Views Right  03/05/2012  *RADIOLOGY REPORT*  Clinical Data: Right leg pain.  Leg instability.  RIGHT KNEE - COMPLETE 4+ VIEW  Comparison: None.  Findings: Osteoarthritis noted with loss of medial and lateral compartmental articular space, marginal medial and lateral compartmental spurring, and mild patellofemoral spurring.  On the lateral projection there appears to be a small knee effusion.  IMPRESSION:  1.  Moderate osteoarthritis of the knee. 2.  Small knee effusion.  Original Report Authenticated By: Dellia Cloud, M.D.    Cardiac Studies: Tele  NRS Assessment/Plan:  1. Acute systolic heart failure 2. Obesity 3. Hypokalemia Rec: replete potassium, continue  diuresis, as she is still overloaded clinically. Progress activity.  LOS: 2 days    Lewayne Bunting M.D. 03/07/2012, 8:56 AM

## 2012-03-08 DIAGNOSIS — E1169 Type 2 diabetes mellitus with other specified complication: Secondary | ICD-10-CM | POA: Diagnosis not present

## 2012-03-08 DIAGNOSIS — E876 Hypokalemia: Secondary | ICD-10-CM | POA: Diagnosis not present

## 2012-03-08 DIAGNOSIS — I10 Essential (primary) hypertension: Secondary | ICD-10-CM | POA: Diagnosis not present

## 2012-03-08 DIAGNOSIS — I509 Heart failure, unspecified: Secondary | ICD-10-CM | POA: Diagnosis not present

## 2012-03-08 DIAGNOSIS — R0609 Other forms of dyspnea: Secondary | ICD-10-CM | POA: Diagnosis not present

## 2012-03-08 DIAGNOSIS — I5021 Acute systolic (congestive) heart failure: Secondary | ICD-10-CM | POA: Diagnosis not present

## 2012-03-08 LAB — BASIC METABOLIC PANEL
BUN: 22 mg/dL (ref 6–23)
CO2: 27 mEq/L (ref 19–32)
CO2: 28 mEq/L (ref 19–32)
Chloride: 99 mEq/L (ref 96–112)
Chloride: 102 mEq/L (ref 96–112)
Creatinine, Ser: 1.19 mg/dL — ABNORMAL HIGH (ref 0.50–1.10)
GFR calc Af Amer: 51 mL/min — ABNORMAL LOW (ref >90)
GFR calc non Af Amer: 45 mL/min — ABNORMAL LOW (ref >90)
Glucose, Bld: 102 mg/dL — ABNORMAL HIGH (ref 70–99)
Potassium: 4.1 mEq/L (ref 3.5–5.1)
Potassium: 3.7 mEq/L (ref 3.5–5.1)
Sodium: 138 mEq/L (ref 135–145)
Sodium: 143 mEq/L (ref 135–145)

## 2012-03-08 LAB — GLUCOSE, CAPILLARY
Glucose-Capillary: 184 mg/dL — ABNORMAL HIGH (ref 70–99)
Glucose-Capillary: 107 mg/dL — ABNORMAL HIGH (ref 70–99)
Glucose-Capillary: 73 mg/dL (ref 70–99)

## 2012-03-08 MED ORDER — CARVEDILOL 6.25 MG PO TABS
6.2500 mg | ORAL_TABLET | Freq: Two times a day (BID) | ORAL | Status: DC
  Administered 2012-03-08 – 2012-03-09 (×2): 6.25 mg via ORAL
  Filled 2012-03-08 (×4): qty 1

## 2012-03-08 MED ORDER — FUROSEMIDE 80 MG PO TABS
80.0000 mg | ORAL_TABLET | Freq: Every day | ORAL | Status: DC
  Administered 2012-03-08 – 2012-03-09 (×2): 80 mg via ORAL
  Filled 2012-03-08 (×2): qty 1

## 2012-03-08 MED ORDER — CARVEDILOL 3.125 MG PO TABS
3.1250 mg | ORAL_TABLET | Freq: Once | ORAL | Status: AC
  Administered 2012-03-08: 3.125 mg via ORAL
  Filled 2012-03-08: qty 1

## 2012-03-08 NOTE — Progress Notes (Signed)
Patient ID: Olivia Mullen, female   DOB: 10-Sep-1936, 77 y.o.   MRN: [id number] Subjective:  Clinically much improved.  She states that her chest tightness and SOB have resolved. She would like to be discharged soon.  Objective:  Vital Signs in the last 24 hours: Temp:  [97.7 F (36.5 C)-98.2 F (36.8 C)] 97.8 F (36.6 C) (05/19 0631) Pulse Rate:  [89-103] 103  (05/19 0631) Resp:  [18] 18  (05/19 0631) BP: (95-128)/(49-63) 128/60 mmHg (05/19 0631) SpO2:  [95 %-100 %] 97 % (05/19 0631) Weight:  [183 lb 3.2 oz (83.1 kg)] 183 lb 3.2 oz (83.1 kg) (05/19 0631)  Intake/Output from previous day: 05/18 0701 - 05/19 0700 In: 680 [P.O.:680] Out: 650 [Urine:650] Intake/Output from this shift:    Physical Exam: Anxious appearing NAD HEENT: OP clear Neck:  8 cm JVD, no thyromegally Lymphatics:  No adenopathy Back:  No CVA tenderness Lungs:  Few basilar rales HEART:  Regular rate rhythm, no murmurs, no rubs, no clicks, distant Abd:  obese, positive bowel sounds, no organomegally, no rebound, no guarding Ext:  2 plus pulses, no edema, no cyanosis, no clubbing Skin:  No rashes no nodules Neuro:  CN II through XII intact, motor grossly intact  Lab Results:  Basename 03/07/12 0500 03/06/12 0920  WBC 7.9 12.1*  HGB 13.0 13.0  PLT 199 210    Basename 03/08/12 0545 03/08/12 0132  NA 143 138  K 3.7 4.1  CL 102 99  CO2 28 27  GLUCOSE 102* 137*  BUN 22 25*  CREATININE 1.16* 1.19*    Basename 03/06/12 2300 03/06/12 1535  TROPONINI <0.30 <0.30   Cardiac Studies: Tele  NRS  Assessment/Plan:  1. Acute systolic heart failure- improving clinically, though daily weights appear to have increased (? inaccurate weights) Will convert lasix to PO today Will continue to optimize medicines Per Dr Katrinka Blazing, outpatient cath 2. Obesity- weight loss will be important long term 3. Hypokalemia- improved  Possible discharge tomorrow if she continues to do well.    LOS: 3 days    Hillis Range  M.D. 03/08/2012, 8:46 AM

## 2012-03-08 NOTE — Progress Notes (Signed)
Subjective: States breathing much better today, did not require Kiefer O2 overnight. stil some DOE but reports much better. Objective: Vital signs in last 24 hours: Temp:  [97.7 F (36.5 Mullen)-98.2 F (36.8 Mullen)] 97.8 F (36.6 Mullen) (05/19 0631) Pulse Rate:  [89-103] 98  (05/19 1109) Resp:  [18] 18  (05/19 0631) BP: (95-134)/(49-63) 134/56 mmHg (05/19 1109) SpO2:  [95 %-100 %] 95 % (05/19 0956) Weight:  [83.1 kg (183 lb 3.2 oz)] 83.1 kg (183 lb 3.2 oz) (05/19 0631) Last BM Date: 03/07/12 Intake/Output from previous day: 05/18 0701 - 05/19 0700 In: 1100 [P.O.:1100] Out: 650 [Urine:650] Intake/Output this shift:      General Appearance:    Alert, cooperative, no distress, appears stated age  Lungs:     Decreased crackles bil., respirations unlabored   Heart:    Regular rate and rhythm, S1 and S2 normal, no murmur, rub   or gallop  Abdomen:     Soft, non-tender, bowel sounds active all four quadrants,    no masses, no organomegaly  Extremities:   Extremities normal, atraumatic, no cyanosis or edema  Neurologic:   CNII-XII intact, normal strength, sensation and reflexes    throughout    Weight change: 0.228 kg (8 oz)  Intake/Output Summary (Last 24 hours) at 03/08/12 1151 Last data filed at 03/08/12 0645  Gross per 24 hour  Intake    880 ml  Output    650 ml  Net    230 ml    Lab Results:   Basename 03/08/12 0545 03/08/12 0132  NA 143 138  K 3.7 4.1  CL 102 99  CO2 28 27  GLUCOSE 102* 137*  BUN 22 25*  CREATININE 1.16* 1.19*  CALCIUM 9.3 9.5    Basename 03/07/12 0500 03/06/12 0920  WBC 7.9 12.1*  HGB 13.0 13.0  HCT 40.0 39.4  PLT 199 210  MCV 91.7 90.8   PT/INR No results found for this basename: LABPROT:2,INR:2 in the last 72 hours ABG No results found for this basename: PHART:2,PCO2:2,PO2:2,HCO3:2 in the last 72 hours  Micro Results: No results found for this or any previous visit (from the past 240 hour(s)). Studies/Results: No results  found. Medications:} Scheduled Meds:    . aspirin EC  325 mg Oral Daily  . baclofen  10 mg Oral BID  . buPROPion  150 mg Oral BID WC  . carvedilol  3.125 mg Oral Once  . carvedilol  6.25 mg Oral BID WC  . docusate sodium  100 mg Oral BID  . doxepin  150 mg Oral QHS  . enoxaparin  40 mg Subcutaneous Daily  . etanercept  50 mg Subcutaneous Weekly  . furosemide  80 mg Oral Daily  . glimepiride  2 mg Oral Q breakfast  . hydroxychloroquine  200 mg Oral Daily  . insulin aspart  0-9 Units Subcutaneous TID WC  . isosorbide mononitrate  30 mg Oral Daily  . leflunomide  10 mg Oral Daily  . levothyroxine  150 mcg Oral Q breakfast  . losartan  50 mg Oral Daily  . potassium chloride  40 mEq Oral BID  . potassium chloride  40 mEq Oral Once  . potassium chloride  40 mEq Oral Once  . predniSONE  8 mg Oral Q breakfast  . sodium chloride  3 mL Intravenous Q12H  . tiotropium  18 mcg Inhalation Daily  . Vitamin D (Ergocalciferol)  50,000 Units Oral Custom  . zonisamide  25 mg Oral QHS  .  DISCONTD: carvedilol  3.125 mg Oral BID WC  . DISCONTD: furosemide  40 mg Intravenous Q12H  . DISCONTD: isosorbide mononitrate  60 mg Oral Daily  . DISCONTD: losartan  100 mg Oral Daily   Continuous Infusions:  PRN Meds:.sodium chloride, acetaminophen, acetaminophen, albuterol, HYDROcodone-acetaminophen, morphine injection, sodium chloride Assessment/Plan: Principal Problem:  *Acute systolic CHF (congestive heart failure) -clinically better, changed to PO lasix, continue ARB,coreg and long acting nitrates. - O2 sat  On room air in am -appreciate cardiology assistance Mildly Elevated Cks- Troponins normal -B-Blocker and nitrates as above, CP free -per cards planning outpatient cath  Hypothyroidism (acquired) -continue synthroid  DM type 2 with hypoglycemic episodes (diabetes mellitus, type 2) -hypoglycemia resolved, continue current meds  Rheumatoid arthritis  -continue outpt meds h/o Multinodular  goiter (nontoxic) Hypokalemia -resolved   LOS: 3 days   Olivia Mullen 03/08/2012, 11:51 AM

## 2012-03-09 DIAGNOSIS — I5031 Acute diastolic (congestive) heart failure: Secondary | ICD-10-CM | POA: Diagnosis not present

## 2012-03-09 DIAGNOSIS — E876 Hypokalemia: Secondary | ICD-10-CM | POA: Diagnosis not present

## 2012-03-09 DIAGNOSIS — I5023 Acute on chronic systolic (congestive) heart failure: Secondary | ICD-10-CM | POA: Diagnosis not present

## 2012-03-09 DIAGNOSIS — I2510 Atherosclerotic heart disease of native coronary artery without angina pectoris: Secondary | ICD-10-CM | POA: Diagnosis not present

## 2012-03-09 DIAGNOSIS — E1169 Type 2 diabetes mellitus with other specified complication: Secondary | ICD-10-CM | POA: Diagnosis not present

## 2012-03-09 DIAGNOSIS — I509 Heart failure, unspecified: Secondary | ICD-10-CM

## 2012-03-09 DIAGNOSIS — I10 Essential (primary) hypertension: Secondary | ICD-10-CM | POA: Diagnosis not present

## 2012-03-09 DIAGNOSIS — E1165 Type 2 diabetes mellitus with hyperglycemia: Secondary | ICD-10-CM

## 2012-03-09 DIAGNOSIS — E119 Type 2 diabetes mellitus without complications: Secondary | ICD-10-CM | POA: Diagnosis not present

## 2012-03-09 LAB — BASIC METABOLIC PANEL
CO2: 21 mEq/L (ref 19–32)
Calcium: 9.2 mg/dL (ref 8.4–10.5)
Creatinine, Ser: 1.03 mg/dL (ref 0.50–1.10)
GFR calc non Af Amer: 52 mL/min — ABNORMAL LOW (ref >90)
Glucose, Bld: 124 mg/dL — ABNORMAL HIGH (ref 70–99)
Sodium: 136 mEq/L (ref 135–145)

## 2012-03-09 LAB — GLUCOSE, CAPILLARY: Glucose-Capillary: 35 mg/dL — CL (ref 70–99)

## 2012-03-09 LAB — PRO B NATRIURETIC PEPTIDE: Pro B Natriuretic peptide (BNP): 847.5 pg/mL — ABNORMAL HIGH (ref 0–125)

## 2012-03-09 MED ORDER — LOSARTAN POTASSIUM 100 MG PO TABS: 50.0000 mg | ORAL_TABLET | Freq: Every day | ORAL | Status: DC

## 2012-03-09 MED ORDER — GLIMEPIRIDE 2 MG PO TABS: 2.0000 mg | ORAL_TABLET | Freq: Every day | ORAL | Status: DC

## 2012-03-09 MED ORDER — ISOSORBIDE MONONITRATE ER 30 MG PO TB24: 30.0000 mg | ORAL_TABLET | Freq: Every day | ORAL | Status: DC

## 2012-03-09 MED ORDER — CARVEDILOL 6.25 MG PO TABS: 6.2500 mg | ORAL_TABLET | Freq: Two times a day (BID) | ORAL | Status: DC

## 2012-03-09 MED ORDER — POTASSIUM CHLORIDE CRYS ER 20 MEQ PO TBCR: 40.0000 meq | EXTENDED_RELEASE_TABLET | Freq: Two times a day (BID) | ORAL | Status: DC

## 2012-03-09 MED ORDER — FUROSEMIDE 80 MG PO TABS: 80.0000 mg | ORAL_TABLET | Freq: Every day | ORAL | Status: DC

## 2012-03-09 MED ORDER — LOSARTAN POTASSIUM 50 MG PO TABS: 50.0000 mg | ORAL_TABLET | Freq: Every day | ORAL | Status: DC

## 2012-03-09 NOTE — Discharge Summary (Signed)
Discharge Note  Name: Olivia Mullen MRN: [id number] DOB: 11-Aug-1936 76 y.o.  Date of Admission: 03/05/2012 10:00 PM Date of Discharge: 03/09/2012 Attending Physician: Kela Millin, MD  Discharge Diagnosis: Principal Problem:  *CHF (congestive heart failure) Active Problems:  Breast cancer, stage 2  Rheumatoid arthritis  Multinodular goiter (nontoxic)  Hypothyroidism (acquired)  DM type 2 (diabetes mellitus, type 2) with hypoglycemic episodes  Acute systolic CHF (congestive heart failure)  Hypokalemia  Hypertension   Discharge Medications: Medication List  As of 03/09/2012 10:39 AM   STOP taking these medications         metFORMIN 500 MG tablet         TAKE these medications         acetaminophen-codeine 300-30 MG per tablet   Commonly known as: TYLENOL #3   Take 1 tablet by mouth 2 (two) times daily. As needed for discomfort      baclofen 10 MG tablet   Commonly known as: LIORESAL   Take 10 mg by mouth 2 (two) times daily. For headaches.      buPROPion 150 MG 12 hr tablet   Commonly known as: WELLBUTRIN SR   Take 150 mg by mouth 2 (two) times daily.      carvedilol 6.25 MG tablet   Commonly known as: COREG   Take 1 tablet (6.25 mg total) by mouth 2 (two) times daily with a meal.      doxepin 150 MG capsule   Commonly known as: SINEQUAN   Take 150 mg by mouth at bedtime.      etanercept 25 MG injection   Commonly known as: ENBREL   Inject 50 mg into the skin once a week. Takes every thursday      furosemide 80 MG tablet   Commonly known as: LASIX   Take 1 tablet (80 mg total) by mouth daily.      glimepiride 2 MG tablet   Commonly known as: AMARYL   Take 1 tablet (2 mg total) by mouth daily with breakfast.      hydroxychloroquine 200 MG tablet   Commonly known as: PLAQUENIL   Take 200 mg by mouth daily. 2 tablets daily      isosorbide mononitrate 30 MG 24 hr tablet   Commonly known as: IMDUR   Take 1 tablet (30 mg total) by mouth daily.     leflunomide 10 MG tablet   Commonly known as: ARAVA   Take 10 mg by mouth daily.      levothyroxine 150 MCG tablet   Commonly known as: SYNTHROID, LEVOTHROID   Take 150 mcg by mouth daily.      losartan 100 MG tablet   Commonly known as: COZAAR   Take 0.5 tablets (50 mg total) by mouth daily.      potassium chloride SA 20 MEQ tablet   Commonly known as: K-DUR,KLOR-CON   Take 2 tablets (40 mEq total) by mouth 2 (two) times daily.      predniSONE 5 MG tablet   Commonly known as: DELTASONE   Take 8 mg by mouth daily.      tiotropium 18 MCG inhalation capsule   Commonly known as: SPIRIVA   Place 18 mcg into inhaler and inhale daily. 1 puff daily      Vitamin D (Ergocalciferol) 50000 UNITS Caps   Commonly known as: DRISDOL   Take 50,000 Units by mouth every 7 (seven) days. Patients takes twice a week on Tuesdays and Saturdays  zonisamide 25 MG capsule   Commonly known as: ZONEGRAN   Take 25 mg by mouth at bedtime.            Disposition and follow-up:   [name] was discharged from [facility] in improved/stable condition.    Follow-up Appointments: Discharge Orders    Future Appointments: Provider: Department: Dept Phone: Center:   09/09/2012 9:00 AM [facility] [phone] [facility]   12/14/2012 10:30 AM Alvina Filbert [facility] [phone] None   12/14/2012 11:00 AM Levert Feinstein, MD [facility] [phone] None     Future Orders Please Complete By Expires   Diet Carb Modified      Increase activity slowly         Consultations: Treatment Team:  Lesleigh Noe, MD  Procedures Performed:  Dg Ribs Unilateral W/chest Left  03/05/2012  *RADIOLOGY REPORT*  Clinical Data: Shortness of breath and left lower rib pain after fall.  BB marks the area of pain.  LEFT RIBS AND CHEST - 3+ VIEW  Comparison: 03/05/2012 at 1952 hours from the [facility].  Findings: Mild cardiac enlargement.   Calcified and tortuous aorta. No focal airspace consolidation in the lungs.  No blunting of costophrenic angles.  No pneumothorax.  Surgical clips in the right axilla and base of neck.  Left shoulder arthroplasty.  Focal irregularities in the anterior left seventh and eighth ribs without acute cortical change likely representing old fracture deformities.  No acute fracture or displacement demonstrated.  No focal bone lesion or expansile change.  IMPRESSION: No evidence of active pulmonary disease.  Cardiac enlargement.  No acute left rib fractures demonstrated.  Original Report Authenticated By: Marlon Pel, M.D.   Dg Hip Complete Right  03/05/2012  *RADIOLOGY REPORT*  Clinical Data: Right hip pain and instability.  RIGHT HIP - COMPLETE 2+ VIEW  Comparison: None.  Findings: Asymmetric and moderate to severe loss articular space noted in the right hip, particularly axially.  No erosion noted. There is only mild loss of articular space in the left hip. Ossific density along the right ischial tuberosity probably represents a chronically fragmented osteophyte.  There is spurring of the right femoral head.  No fracture or dislocation is observed.  IMPRESSION:  1.  Moderate to marked degenerative right hip arthropathy.  Mild left hip arthropathy. 2.  Suspected chronically fragmented osteophyte along the right hamstring origination.  Original Report Authenticated By: Dellia Cloud, M.D.   Ct Angio Chest W/cm &/or Wo Cm  03/06/2012  *RADIOLOGY REPORT*  Clinical Data: Shortness of breath with left-sided chest pain. Tachycardia.  Fall.  CT ANGIOGRAPHY CHEST  Technique:  Multidetector CT imaging of the chest using the standard protocol during bolus administration of intravenous contrast. Multiplanar reconstructed images including MIPs were obtained and reviewed to evaluate the vascular anatomy.  Contrast: OMNIPAQUE IOHEXOL 350 MG/ML SOLN  Comparison: None.  Findings: Technically adequate study with  good opacification of the central and segmental pulmonary arteries.  No focal filling defects demonstrated.  No evidence of significant pulmonary embolus. Cardiac enlargement with passive congestion of contrast material into the liver.  Normal caliber thoracic aorta with calcification. Prominent right paratracheal and anterior mediastinal fat.  No significant lymphadenopathy in the chest.  Esophagus is mostly decompressed.  The small bilateral pleural effusions.  Mild bilateral basilar atelectasis.  No focal airspace consolidation or interstitial changes.  Scattered calcified granulomas in the lungs. No pneumothorax.  Postoperative changes  in the left shoulder.  No displaced rib fractures.  Mild degenerative changes in the thoracic spine.  Degenerative change in the right shoulder.  Previous thyroidectomy.  IMPRESSION: No evidence of significant pulmonary embolus.  Congestive changes in the heart lungs with cardiac enlargement, passive congestion of the liver, and small bilateral pleural effusions.  Mild bilateral basilar atelectasis.  Original Report Authenticated By: Marlon Pel, M.D.   Dg Knee Complete 4 Views Right  03/05/2012  *RADIOLOGY REPORT*  Clinical Data: Right leg pain.  Leg instability.  RIGHT KNEE - COMPLETE 4+ VIEW  Comparison: None.  Findings: Osteoarthritis noted with loss of medial and lateral compartmental articular space, marginal medial and lateral compartmental spurring, and mild patellofemoral spurring.  On the lateral projection there appears to be a small knee effusion.  IMPRESSION:  1.  Moderate osteoarthritis of the knee. 2.  Small knee effusion.  Original Report Authenticated By: Dellia Cloud, M.D.    2D Echo Study Conclusions  - Left ventricle: The cavity size was normal. Systolic function was severely reduced. The estimated ejection fraction was in the range of 20% to 25%. Diffuse hypokinesis. The study is not technically sufficient to allow evaluation of LV  diastolic function. - Mitral valve: Calcified annulus. Mildly thickened leaflets . Moderate regurgitation. - Left atrium: The atrium was mildly dilated. - Atrial septum: No defect or patent foramen ovale was identified. Transthoracic echocardiography    Admission HPI LICET DUNPHY is an 76 y.o. female with history of breast cancer, type 2 diabetes, hypothyroidism, rheumatoid arthritis, prior tobacco use, presents to the urgent care complaining of dyspnea and exertion and chest pain. Her chest pain is likely from her recent fall, with trauma directly to her chest. She has a large contusion over left breast and left flank. She does have however her progressive increased dyspnea and exertion. She was referred to [facility] cardiology by her primary care physician, but missed her appointment because of her migraine headache. At the urgent care, Chest x-ray showed cardiomegaly and vascular congestion with pleural effusions. Her EKG showed left anterior fascicular block, sinus tachycardia at 110. She was subsequently referred to [facility] emergency room for further evaluation. A CT pulmonary angiogram was performed which showed no PE, no infiltrate, but with vascular congestion and cardiomegaly. It also has bilateral pleural effusions as well. Her laboratory study shows elevated pro BNP of 5200. She has total CPK of 500 with 6% MB fraction but negative troponin. She has a white count of 11,000 and hemoglobin of 12.3 g per decaliter. She was given nebulizer treatment without improvement, and hospitalist was asked to admit her for new onset of congestive heart failure. On followup this morning she denies any shortness of breath, denies chest pain.  Physical exam General Appearance:    Alert, cooperative, no distress, appears stated age  Lungs:     Clear to auscultation bilaterally, respirations unlabored   Heart:    Regular rate and rhythm, S1 and S2 normal,  rub   or gallop  Abdomen:     Soft, non-tender,  bowel sounds active all four quadrants,    no masses, no organomegaly  Extremities:   no cyanosis or edema  Neurologic:   CNII-XII intact, normal strength, nonfocal         Hospital Course by problem list: Principal Problem:  *CHF (congestive heart failure) Active Problems:  Breast cancer, stage 2  Rheumatoid arthritis  Multinodular goiter (nontoxic)  Hypothyroidism (acquired)  DM type 2 (diabetes mellitus,  type 2)  Acute systolic CHF (congestive heart failure) with hypoglycemic episodes  Hypokalemia  Hypertension  Principal Problem:  *Acute systolic CHF (congestive heart failure)  As discussed above, Upon admission a chest x-ray was done and showed congestive changes in the heart and lungs with cardiac enlargement, passive congestion of the liver and small bilateral pleural effusions,no evidence of active pulmonary disease. A brain natruretic peptide was done and was elevated at 5292. Cardiac enzymes were cycled and an echocardiogram was done and the results as stated above with ejection fraction 20-25% . She was started on IV Lasix for diuresis-she diuresed well but has symptoms of much clinically improved. Cardiology was consulted and she was seen by Dr. Katrinka Blazing and started on Coreg and long acting nitrates. She was maintained on an ARB but the dose was decreased to 50 mg daily to avoid hypotension. The patient's cardiac enzymes came back with elevated CKs although the troponins were within normal limits. Per cardiology she likely has coronary artery disease and plan is to speak with patient and husband as outpatient and decide on cath/PCI/surgery versus medical therapy. Patient oxygenating well on room air, she is medically stable for discharge at this time, and is to follow up outpatient.  Mildly Elevated Cks- Troponins normal  As discussed above, per cards possible non-ST elevation MI. She was started on beta blockers and nitrates as above and has remained chest pain-free she is to  follow up outpatient with Dr. Smith/cardiology. Hypothyroidism (acquired)  She was maintained on Synthroid while in the hospital and is to continue upon discharge.  DM type 2 with hypoglycemic episodes (diabetes mellitus, type 2)  She was restarted on her oral hypoglycemics-Amaryl preadmission dose upon admission and her metformin was held off but even on just as single agent she will had hypoglycemic episodes and so the Amaryl dose was decreased to 2 mg at daily and she's not had any further hypoglycemic episodes. She is to followup with her primary care physician for further monitoring of her blood sugars and adjustment of her medications as appropriate for optimal blood glucose control.  Rheumatoid arthritis  She was maintained on her outpatient medications while in the hospital and is to continue them upon discharge.   Hypokalemia  Her potassium was replaced in the hospital.     Discharge Vitals:  BP 122/61  Pulse 93  Temp(Src) 97.3 F (36.3 C) (Oral)  Resp 18  Ht 5\' 2"  (1.575 m)  Wt 83.4 kg (183 lb 13.8 oz)  BMI 33.63 kg/m2  SpO2 99%  Discharge Labs:  Results for orders placed during the hospital encounter of 03/05/12 (from the past 24 hour(s))  GLUCOSE, CAPILLARY     Status: Abnormal   Collection Time   03/08/12 10:54 AM      Component Value Range   Glucose-Capillary 197 (*) 70 - 99 (mg/dL)  GLUCOSE, CAPILLARY     Status: Normal   Collection Time   03/08/12  3:46 PM      Component Value Range   Glucose-Capillary 74  70 - 99 (mg/dL)  GLUCOSE, CAPILLARY     Status: Abnormal   Collection Time   03/08/12 10:00 PM      Component Value Range   Glucose-Capillary 233 (*) 70 - 99 (mg/dL)   Comment 1 Notify RN    PRO B NATRIURETIC PEPTIDE     Status: Abnormal   Collection Time   03/09/12  5:50 AM      Component Value Range   Pro  B Natriuretic peptide (BNP) 847.5 (*) 0 - 125 (pg/mL)  BASIC METABOLIC PANEL     Status: Abnormal   Collection Time   03/09/12  5:50 AM      Component  Value Range   Sodium 136  135 - 145 (mEq/L)   Potassium 4.2  3.5 - 5.1 (mEq/L)   Chloride 99  96 - 112 (mEq/L)   CO2 21  19 - 32 (mEq/L)   Glucose, Bld 124 (*) 70 - 99 (mg/dL)   BUN 23  6 - 23 (mg/dL)   Creatinine, Ser 4.09  0.50 - 1.10 (mg/dL)   Calcium 9.2  8.4 - 81.1 (mg/dL)   GFR calc non Af Amer 52 (*) >90 (mL/min)   GFR calc Af Amer 61 (*) >90 (mL/min)  GLUCOSE, CAPILLARY     Status: Abnormal   Collection Time   03/09/12  6:46 AM      Component Value Range   Glucose-Capillary 117 (*) 70 - 99 (mg/dL)    Signed: Kela Millin 03/09/2012, 10:39 AM

## 2012-03-09 NOTE — Progress Notes (Signed)
IV d/c'd.  Tele d/c'd.  Pt d/c'd to home. Home meds and d/c instructions have been discussed with pt and pt family member.  Both deny any questions or concerns at this time.  Pt leaving unit via wheelchair and appears in no acute distress. Nino Glow RN

## 2012-03-09 NOTE — Progress Notes (Signed)
Patient Name: Olivia Mullen Date of Encounter: 03/09/2012    SUBJECTIVE: Not sleeping in the hospital. Denied chest pain and dyspnea. Husband says she panicked last night and has h/o such.  TELEMETRY:  NSR: Filed Vitals:   03/08/12 2339 03/09/12 0246 03/09/12 0401 03/09/12 0842  BP: 96/44  114/54   Pulse: 91  92   Temp: 97.8 F (36.6 C)  97.3 F (36.3 C)   TempSrc: Oral  Oral   Resp: 20  18   Height:      Weight:   83.4 kg (183 lb 13.8 oz)   SpO2: 97% 100% 99% 99%    Intake/Output Summary (Last 24 hours) at 03/09/12 0843 Last data filed at 03/09/12 0813  Gross per 24 hour  Intake    720 ml  Output    875 ml  Net   -155 ml    LABS: Basic Metabolic Panel:  Basename 03/09/12 0550 03/08/12 0545 03/07/12 0710  NA 136 143 --  K 4.2 3.7 --  CL 99 102 --  CO2 21 28 --  GLUCOSE 124* 102* --  BUN 23 22 --  CREATININE 1.03 1.16* --  CALCIUM 9.2 9.3 --  MG -- -- 1.8  PHOS -- -- --   CBC:  Basename 03/07/12 0500 03/06/12 0920  WBC 7.9 12.1*  NEUTROABS -- --  HGB 13.0 13.0  HCT 40.0 39.4  MCV 91.7 90.8  PLT 199 210   Cardiac Enzymes:  Basename 03/06/12 2300 03/06/12 1535 03/06/12 0920  CKTOTAL 293* 492* 549*  CKMB 4.0 5.4* 5.9*  CKMBINDEX -- -- --  TROPONINI <0.30 <0.30 <0.30   BNP (last 3 results)  Basename 03/09/12 0550 03/06/12 0334  PROBNP 847.5* 5292.0*   Radiology/Studies:  No new data  Physical Exam: Blood pressure 114/54, pulse 92, temperature 97.3 F (36.3 C), temperature source Oral, resp. rate 18, height 5\' 2"  (1.575 m), weight 83.4 kg (183 lb 13.8 oz), SpO2 99.00%. Weight change: -3 kg (-6 lb 9.8 oz)   S3 gallop  Decreased memory and husband answers most questions for her.  ASSESSMENT:  1. Acute systolic heart failure, clinically better.  2. Suspect CAD and will speak with the patient and and husband as an OP about workup and try to get a better feeling about her functionality and desires for treatment, ie cath / PCI /surgery vs med  therapy.   Plan:  1. Discharge on Lasix, Carvedilol, Imdur, potassium and Losartan 2. We will arrange cardiology f/u   Signed, Lesleigh Noe 03/09/2012, 8:43 AM

## 2012-03-10 NOTE — ED Provider Notes (Signed)
Medical screening examination/treatment/procedure(s) were conducted as a shared visit with non-physician practitioner(s) and myself.  I personally evaluated the patient during the encounter.  Please see my note from this same date.  Olivia Mackie, MD 03/10/12 [phone]

## 2012-03-12 DIAGNOSIS — E119 Type 2 diabetes mellitus without complications: Secondary | ICD-10-CM | POA: Diagnosis not present

## 2012-03-12 DIAGNOSIS — I509 Heart failure, unspecified: Secondary | ICD-10-CM | POA: Diagnosis not present

## 2012-03-12 DIAGNOSIS — E876 Hypokalemia: Secondary | ICD-10-CM | POA: Diagnosis not present

## 2012-03-17 DIAGNOSIS — M25569 Pain in unspecified knee: Secondary | ICD-10-CM | POA: Diagnosis not present

## 2012-03-17 DIAGNOSIS — M169 Osteoarthritis of hip, unspecified: Secondary | ICD-10-CM | POA: Diagnosis not present

## 2012-03-17 DIAGNOSIS — M1710 Unilateral primary osteoarthritis, unspecified knee: Secondary | ICD-10-CM | POA: Diagnosis not present

## 2012-03-19 DIAGNOSIS — F3342 Major depressive disorder, recurrent, in full remission: Secondary | ICD-10-CM | POA: Diagnosis not present

## 2012-03-20 DIAGNOSIS — I10 Essential (primary) hypertension: Secondary | ICD-10-CM | POA: Diagnosis not present

## 2012-03-20 DIAGNOSIS — I5020 Unspecified systolic (congestive) heart failure: Secondary | ICD-10-CM | POA: Diagnosis not present

## 2012-03-20 DIAGNOSIS — M069 Rheumatoid arthritis, unspecified: Secondary | ICD-10-CM | POA: Diagnosis not present

## 2012-03-20 DIAGNOSIS — E7800 Pure hypercholesterolemia, unspecified: Secondary | ICD-10-CM | POA: Diagnosis not present

## 2012-03-20 DIAGNOSIS — Z79899 Other long term (current) drug therapy: Secondary | ICD-10-CM | POA: Diagnosis not present

## 2012-03-20 DIAGNOSIS — E119 Type 2 diabetes mellitus without complications: Secondary | ICD-10-CM | POA: Diagnosis not present

## 2012-03-23 ENCOUNTER — Other Ambulatory Visit: Payer: Self-pay | Admitting: Interventional Cardiology

## 2012-03-23 DIAGNOSIS — M25559 Pain in unspecified hip: Secondary | ICD-10-CM | POA: Diagnosis not present

## 2012-03-23 DIAGNOSIS — M169 Osteoarthritis of hip, unspecified: Secondary | ICD-10-CM | POA: Diagnosis not present

## 2012-03-24 DIAGNOSIS — M76899 Other specified enthesopathies of unspecified lower limb, excluding foot: Secondary | ICD-10-CM | POA: Diagnosis not present

## 2012-03-26 ENCOUNTER — Encounter (HOSPITAL_BASED_OUTPATIENT_CLINIC_OR_DEPARTMENT_OTHER): Admission: RE | Payer: Self-pay | Source: Ambulatory Visit

## 2012-03-26 ENCOUNTER — Inpatient Hospital Stay (HOSPITAL_BASED_OUTPATIENT_CLINIC_OR_DEPARTMENT_OTHER)
Admission: RE | Admit: 2012-03-26 | Payer: Medicare Other | Source: Ambulatory Visit | Admitting: Interventional Cardiology

## 2012-03-26 SURGERY — JV LEFT HEART CATHETERIZATION WITH CORONARY ANGIOGRAM
Anesthesia: Moderate Sedation

## 2012-03-30 DIAGNOSIS — M1990 Unspecified osteoarthritis, unspecified site: Secondary | ICD-10-CM | POA: Diagnosis not present

## 2012-03-30 DIAGNOSIS — I509 Heart failure, unspecified: Secondary | ICD-10-CM | POA: Diagnosis not present

## 2012-03-30 DIAGNOSIS — M2550 Pain in unspecified joint: Secondary | ICD-10-CM | POA: Diagnosis not present

## 2012-03-30 DIAGNOSIS — M069 Rheumatoid arthritis, unspecified: Secondary | ICD-10-CM | POA: Diagnosis not present

## 2012-03-30 DIAGNOSIS — Z79899 Other long term (current) drug therapy: Secondary | ICD-10-CM | POA: Diagnosis not present

## 2012-03-31 ENCOUNTER — Other Ambulatory Visit: Payer: Self-pay | Admitting: Interventional Cardiology

## 2012-04-03 ENCOUNTER — Inpatient Hospital Stay (HOSPITAL_BASED_OUTPATIENT_CLINIC_OR_DEPARTMENT_OTHER)
Admission: RE | Admit: 2012-04-03 | Discharge: 2012-04-03 | Disposition: A | Discharge status: Home / Self Care | Payer: Medicare Other | Source: Ambulatory Visit | Attending: Interventional Cardiology | Admitting: Interventional Cardiology

## 2012-04-03 ENCOUNTER — Encounter (HOSPITAL_BASED_OUTPATIENT_CLINIC_OR_DEPARTMENT_OTHER)
Admission: RE | Disposition: A | Discharge status: Home / Self Care | Payer: Self-pay | Source: Ambulatory Visit | Attending: Interventional Cardiology

## 2012-04-03 DIAGNOSIS — I5020 Unspecified systolic (congestive) heart failure: Secondary | ICD-10-CM | POA: Diagnosis not present

## 2012-04-03 DIAGNOSIS — Z9221 Personal history of antineoplastic chemotherapy: Secondary | ICD-10-CM | POA: Insufficient documentation

## 2012-04-03 DIAGNOSIS — E119 Type 2 diabetes mellitus without complications: Secondary | ICD-10-CM | POA: Diagnosis not present

## 2012-04-03 DIAGNOSIS — I501 Left ventricular failure: Secondary | ICD-10-CM | POA: Diagnosis not present

## 2012-04-03 DIAGNOSIS — I428 Other cardiomyopathies: Secondary | ICD-10-CM | POA: Insufficient documentation

## 2012-04-03 DIAGNOSIS — Z853 Personal history of malignant neoplasm of breast: Secondary | ICD-10-CM | POA: Insufficient documentation

## 2012-04-03 DIAGNOSIS — I509 Heart failure, unspecified: Secondary | ICD-10-CM | POA: Diagnosis not present

## 2012-04-03 SURGERY — JV LEFT HEART CATHETERIZATION WITH CORONARY ANGIOGRAM
Anesthesia: Moderate Sedation

## 2012-04-03 MED ORDER — SODIUM CHLORIDE 0.9 % IV SOLN: INTRAVENOUS | Status: DC

## 2012-04-03 MED ORDER — SODIUM CHLORIDE 0.9 % IV SOLN
INTRAVENOUS | Status: DC
  Administered 2012-04-03: 09:00:00 via INTRAVENOUS

## 2012-04-03 MED ORDER — ONDANSETRON HCL 4 MG/2ML IJ SOLN: 4.0000 mg | Freq: Four times a day (QID) | INTRAMUSCULAR | Status: DC | PRN

## 2012-04-03 MED ORDER — ACETAMINOPHEN 325 MG PO TABS: 650.0000 mg | ORAL_TABLET | ORAL | Status: DC | PRN

## 2012-04-03 MED ORDER — SODIUM CHLORIDE 0.9 % IJ SOLN: 3.0000 mL | Freq: Two times a day (BID) | INTRAMUSCULAR | Status: DC

## 2012-04-03 MED ORDER — ASPIRIN 81 MG PO CHEW
324.0000 mg | CHEWABLE_TABLET | ORAL | Status: AC
  Administered 2012-04-03: 324 mg via ORAL

## 2012-04-03 MED ORDER — SODIUM CHLORIDE 0.9 % IV SOLN: 250.0000 mL | INTRAVENOUS | Status: DC | PRN

## 2012-04-03 MED ORDER — SODIUM CHLORIDE 0.9 % IJ SOLN: 3.0000 mL | INTRAMUSCULAR | Status: DC | PRN

## 2012-04-03 MED ORDER — DIAZEPAM 5 MG PO TABS
5.0000 mg | ORAL_TABLET | ORAL | Status: AC
  Administered 2012-04-03: 5 mg via ORAL

## 2012-04-03 NOTE — CV Procedure (Signed)
     Diagnostic Cardiac Catheterization Report  Olivia Mullen  76 y.o.  female 04-26-1936  Procedure Date:04/03/2012 Referring Physician: Merri Brunette, M.D. Primary Cardiologist:: Wayne Both, M.D.   PROCEDURE:  Left heart catheterization with selective coronary angiography, left ventriculogram.  INDICATIONS:  Dilated cardiomyopathy with risk factors for coronary artery disease. The patient presented with systolic heart failure and an ejection fraction less than 30%. This study will help to exclude coronary artery disease as the cause. She does have a prior history of chemotherapy for breast cancer.  The risks, benefits, and details of the procedure were explained to the patient.  The patient verbalized understanding and wanted to proceed.  Informed written consent was obtained.  PROCEDURE TECHNIQUE:  After Xylocaine anesthesia a 4 French sheath was placed in the right femoral artery with a single anterior needle wall stick.   Coronary angiography was done using a 4 [place] JR and JL #4 catheters.  Left ventriculography was done using a 4 [place] A2 MP catheter.    CONTRAST:  Total of 50 cc.  COMPLICATIONS:  None.    HEMODYNAMICS:  Aortic pressure was 139/58 mmHg; LV pressure was 140/10 mmHg; LVEDP 14 mm mercury.  There was no gradient between the left ventricle and aorta.    ANGIOGRAPHIC DATA:   The left main coronary artery is normal.  The left anterior descending artery is normal..  The left circumflex artery is normal.  The ramus intermedius is normal.  The right coronary artery is codominant and normal.  LEFT VENTRICULOGRAM:  Left ventricular angiogram was done in the 30 RAO projection and revealed mild to moderate global hypokinesis with an estimated ejection fraction of 35-40 %. The LVEF has significantly improved compared to the echocardiogram done 6 weeks ago  IMPRESSIONS:  1. Nonischemic cardiomyopathy, possibly related to late effects of prior chemotherapy for  breast cancer.  2. Normal coronary arteries.   RECOMMENDATION:  Continue medical therapy for heart failure.Marland Kitchen

## 2012-04-03 NOTE — Progress Notes (Signed)
Bedrest begins @ 1030, tegaderm dressing applied by Army Melia.

## 2012-04-03 NOTE — H&P (Signed)
  The patient had systolic heart failure and is a long-standing diabetic. Coronary disease the likely cause. She is undergoing this procedure to rule out high-grade three-vessel disease. The procedure and risks were discussed. The patient gives approval for [place] to proceed. She understands the risks involved .

## 2012-04-06 LAB — POCT I-STAT GLUCOSE: Glucose, Bld: 72 mg/dL (ref 70–99)

## 2012-04-09 ENCOUNTER — Other Ambulatory Visit: Payer: Self-pay | Admitting: Internal Medicine

## 2012-04-14 DIAGNOSIS — E559 Vitamin D deficiency, unspecified: Secondary | ICD-10-CM | POA: Diagnosis not present

## 2012-04-27 DIAGNOSIS — F3342 Major depressive disorder, recurrent, in full remission: Secondary | ICD-10-CM | POA: Diagnosis not present

## 2012-04-28 ENCOUNTER — Other Ambulatory Visit: Payer: Self-pay | Admitting: *Deleted

## 2012-04-28 NOTE — Telephone Encounter (Signed)
PT.'S HUSBAND COULD NOT FIND MEDICATION BOTTLE NOR REMEMBER THE NAME OF THE MEDICATION. ON PT.'S [employer] MEDICATION LIST COMPAZINE 10MG  #60 ONE TAB EVERY FOUR HOURS IS LISTED. PT.'S HUSBAND STATES DR.CANDACE SMITH IS PT.'S PRIMARY CARE PHYSICIAN. SHOULD THIS REQUEST GO TO PT.'S PRIMARY CARE PHYSICIAN? THIS NOTE WAS PLACED IN DR.GRANFORTUNA'S ACTIVE WORK BOX.

## 2012-05-04 DIAGNOSIS — R0602 Shortness of breath: Secondary | ICD-10-CM | POA: Diagnosis not present

## 2012-05-04 DIAGNOSIS — E119 Type 2 diabetes mellitus without complications: Secondary | ICD-10-CM | POA: Diagnosis not present

## 2012-05-04 DIAGNOSIS — I10 Essential (primary) hypertension: Secondary | ICD-10-CM | POA: Diagnosis not present

## 2012-05-04 DIAGNOSIS — I951 Orthostatic hypotension: Secondary | ICD-10-CM | POA: Diagnosis not present

## 2012-05-04 DIAGNOSIS — I5020 Unspecified systolic (congestive) heart failure: Secondary | ICD-10-CM | POA: Diagnosis not present

## 2012-05-06 DIAGNOSIS — E119 Type 2 diabetes mellitus without complications: Secondary | ICD-10-CM | POA: Diagnosis not present

## 2012-05-06 DIAGNOSIS — N390 Urinary tract infection, site not specified: Secondary | ICD-10-CM | POA: Diagnosis not present

## 2012-05-06 DIAGNOSIS — E7800 Pure hypercholesterolemia, unspecified: Secondary | ICD-10-CM | POA: Diagnosis not present

## 2012-05-06 DIAGNOSIS — R82998 Other abnormal findings in urine: Secondary | ICD-10-CM | POA: Diagnosis not present

## 2012-05-06 DIAGNOSIS — R110 Nausea: Secondary | ICD-10-CM | POA: Diagnosis not present

## 2012-05-06 DIAGNOSIS — E039 Hypothyroidism, unspecified: Secondary | ICD-10-CM | POA: Diagnosis not present

## 2012-05-06 DIAGNOSIS — E669 Obesity, unspecified: Secondary | ICD-10-CM | POA: Diagnosis not present

## 2012-05-06 DIAGNOSIS — I10 Essential (primary) hypertension: Secondary | ICD-10-CM | POA: Diagnosis not present

## 2012-05-11 DIAGNOSIS — M76899 Other specified enthesopathies of unspecified lower limb, excluding foot: Secondary | ICD-10-CM | POA: Diagnosis not present

## 2012-05-11 DIAGNOSIS — M169 Osteoarthritis of hip, unspecified: Secondary | ICD-10-CM | POA: Diagnosis not present

## 2012-05-15 DIAGNOSIS — I10 Essential (primary) hypertension: Secondary | ICD-10-CM | POA: Diagnosis not present

## 2012-05-20 DIAGNOSIS — M76899 Other specified enthesopathies of unspecified lower limb, excluding foot: Secondary | ICD-10-CM | POA: Diagnosis not present

## 2012-05-20 DIAGNOSIS — M25559 Pain in unspecified hip: Secondary | ICD-10-CM | POA: Diagnosis not present

## 2012-06-08 DIAGNOSIS — I5020 Unspecified systolic (congestive) heart failure: Secondary | ICD-10-CM | POA: Diagnosis not present

## 2012-06-08 DIAGNOSIS — I951 Orthostatic hypotension: Secondary | ICD-10-CM | POA: Diagnosis not present

## 2012-06-08 DIAGNOSIS — E119 Type 2 diabetes mellitus without complications: Secondary | ICD-10-CM | POA: Diagnosis not present

## 2012-06-29 DIAGNOSIS — M5137 Other intervertebral disc degeneration, lumbosacral region: Secondary | ICD-10-CM | POA: Diagnosis not present

## 2012-06-29 DIAGNOSIS — M999 Biomechanical lesion, unspecified: Secondary | ICD-10-CM | POA: Diagnosis not present

## 2012-06-30 DIAGNOSIS — M999 Biomechanical lesion, unspecified: Secondary | ICD-10-CM | POA: Diagnosis not present

## 2012-06-30 DIAGNOSIS — M5137 Other intervertebral disc degeneration, lumbosacral region: Secondary | ICD-10-CM | POA: Diagnosis not present

## 2012-07-01 DIAGNOSIS — M5137 Other intervertebral disc degeneration, lumbosacral region: Secondary | ICD-10-CM | POA: Diagnosis not present

## 2012-07-01 DIAGNOSIS — M999 Biomechanical lesion, unspecified: Secondary | ICD-10-CM | POA: Diagnosis not present

## 2012-07-04 DIAGNOSIS — M5137 Other intervertebral disc degeneration, lumbosacral region: Secondary | ICD-10-CM | POA: Diagnosis not present

## 2012-07-04 DIAGNOSIS — M999 Biomechanical lesion, unspecified: Secondary | ICD-10-CM | POA: Diagnosis not present

## 2012-07-06 DIAGNOSIS — M5137 Other intervertebral disc degeneration, lumbosacral region: Secondary | ICD-10-CM | POA: Diagnosis not present

## 2012-07-06 DIAGNOSIS — M999 Biomechanical lesion, unspecified: Secondary | ICD-10-CM | POA: Diagnosis not present

## 2012-07-10 DIAGNOSIS — M999 Biomechanical lesion, unspecified: Secondary | ICD-10-CM | POA: Diagnosis not present

## 2012-07-10 DIAGNOSIS — M5137 Other intervertebral disc degeneration, lumbosacral region: Secondary | ICD-10-CM | POA: Diagnosis not present

## 2012-07-11 DIAGNOSIS — M5137 Other intervertebral disc degeneration, lumbosacral region: Secondary | ICD-10-CM | POA: Diagnosis not present

## 2012-07-11 DIAGNOSIS — M999 Biomechanical lesion, unspecified: Secondary | ICD-10-CM | POA: Diagnosis not present

## 2012-07-14 DIAGNOSIS — M5137 Other intervertebral disc degeneration, lumbosacral region: Secondary | ICD-10-CM | POA: Diagnosis not present

## 2012-07-14 DIAGNOSIS — M999 Biomechanical lesion, unspecified: Secondary | ICD-10-CM | POA: Diagnosis not present

## 2012-07-16 DIAGNOSIS — M999 Biomechanical lesion, unspecified: Secondary | ICD-10-CM | POA: Diagnosis not present

## 2012-07-16 DIAGNOSIS — M5137 Other intervertebral disc degeneration, lumbosacral region: Secondary | ICD-10-CM | POA: Diagnosis not present

## 2012-07-27 DIAGNOSIS — F3342 Major depressive disorder, recurrent, in full remission: Secondary | ICD-10-CM | POA: Diagnosis not present

## 2012-08-03 DIAGNOSIS — Z23 Encounter for immunization: Secondary | ICD-10-CM | POA: Diagnosis not present

## 2012-08-06 DIAGNOSIS — Z961 Presence of intraocular lens: Secondary | ICD-10-CM | POA: Diagnosis not present

## 2012-08-12 DIAGNOSIS — M1990 Unspecified osteoarthritis, unspecified site: Secondary | ICD-10-CM | POA: Diagnosis not present

## 2012-08-12 DIAGNOSIS — M2550 Pain in unspecified joint: Secondary | ICD-10-CM | POA: Diagnosis not present

## 2012-08-12 DIAGNOSIS — Z79899 Other long term (current) drug therapy: Secondary | ICD-10-CM | POA: Diagnosis not present

## 2012-08-12 DIAGNOSIS — M7150 Other bursitis, not elsewhere classified, unspecified site: Secondary | ICD-10-CM | POA: Diagnosis not present

## 2012-08-12 DIAGNOSIS — M069 Rheumatoid arthritis, unspecified: Secondary | ICD-10-CM | POA: Diagnosis not present

## 2012-08-14 DIAGNOSIS — M25559 Pain in unspecified hip: Secondary | ICD-10-CM | POA: Diagnosis not present

## 2012-08-14 DIAGNOSIS — M545 Low back pain: Secondary | ICD-10-CM | POA: Diagnosis not present

## 2012-08-19 DIAGNOSIS — M25559 Pain in unspecified hip: Secondary | ICD-10-CM | POA: Diagnosis not present

## 2012-08-19 DIAGNOSIS — M545 Low back pain: Secondary | ICD-10-CM | POA: Diagnosis not present

## 2012-08-21 DIAGNOSIS — M25559 Pain in unspecified hip: Secondary | ICD-10-CM | POA: Diagnosis not present

## 2012-08-21 DIAGNOSIS — M545 Low back pain: Secondary | ICD-10-CM | POA: Diagnosis not present

## 2012-08-24 DIAGNOSIS — M25559 Pain in unspecified hip: Secondary | ICD-10-CM | POA: Diagnosis not present

## 2012-08-24 DIAGNOSIS — M545 Low back pain, unspecified: Secondary | ICD-10-CM | POA: Diagnosis not present

## 2012-09-09 ENCOUNTER — Ambulatory Visit
Admission: RE | Admit: 2012-09-09 | Discharge: 2012-09-09 | Disposition: A | Discharge status: Home / Self Care | Payer: Medicare Other | Source: Ambulatory Visit | Attending: Oncology | Admitting: Oncology

## 2012-09-09 ENCOUNTER — Ambulatory Visit: Payer: Medicare Other

## 2012-09-09 DIAGNOSIS — Z1231 Encounter for screening mammogram for malignant neoplasm of breast: Secondary | ICD-10-CM | POA: Diagnosis not present

## 2012-09-11 ENCOUNTER — Other Ambulatory Visit: Payer: Self-pay | Admitting: Oncology

## 2012-09-11 DIAGNOSIS — R928 Other abnormal and inconclusive findings on diagnostic imaging of breast: Secondary | ICD-10-CM

## 2012-09-12 DIAGNOSIS — N390 Urinary tract infection, site not specified: Secondary | ICD-10-CM | POA: Diagnosis not present

## 2012-09-12 DIAGNOSIS — R300 Dysuria: Secondary | ICD-10-CM | POA: Diagnosis not present

## 2012-09-22 DIAGNOSIS — M5137 Other intervertebral disc degeneration, lumbosacral region: Secondary | ICD-10-CM | POA: Diagnosis not present

## 2012-09-22 DIAGNOSIS — M999 Biomechanical lesion, unspecified: Secondary | ICD-10-CM | POA: Diagnosis not present

## 2012-09-22 DIAGNOSIS — M25559 Pain in unspecified hip: Secondary | ICD-10-CM | POA: Diagnosis not present

## 2012-09-23 ENCOUNTER — Ambulatory Visit
Admission: RE | Admit: 2012-09-23 | Discharge: 2012-09-23 | Disposition: A | Discharge status: Home / Self Care | Payer: Medicare Other | Source: Ambulatory Visit | Attending: Oncology | Admitting: Oncology

## 2012-09-23 DIAGNOSIS — R928 Other abnormal and inconclusive findings on diagnostic imaging of breast: Secondary | ICD-10-CM

## 2012-09-23 DIAGNOSIS — N6019 Diffuse cystic mastopathy of unspecified breast: Secondary | ICD-10-CM | POA: Diagnosis not present

## 2012-09-23 DIAGNOSIS — N63 Unspecified lump in unspecified breast: Secondary | ICD-10-CM | POA: Diagnosis not present

## 2012-09-24 DIAGNOSIS — M5137 Other intervertebral disc degeneration, lumbosacral region: Secondary | ICD-10-CM | POA: Diagnosis not present

## 2012-09-24 DIAGNOSIS — M999 Biomechanical lesion, unspecified: Secondary | ICD-10-CM | POA: Diagnosis not present

## 2012-09-24 DIAGNOSIS — Z87440 Personal history of urinary (tract) infections: Secondary | ICD-10-CM | POA: Diagnosis not present

## 2012-09-28 DIAGNOSIS — M999 Biomechanical lesion, unspecified: Secondary | ICD-10-CM | POA: Diagnosis not present

## 2012-09-28 DIAGNOSIS — M5137 Other intervertebral disc degeneration, lumbosacral region: Secondary | ICD-10-CM | POA: Diagnosis not present

## 2012-09-29 DIAGNOSIS — M999 Biomechanical lesion, unspecified: Secondary | ICD-10-CM | POA: Diagnosis not present

## 2012-09-29 DIAGNOSIS — M5137 Other intervertebral disc degeneration, lumbosacral region: Secondary | ICD-10-CM | POA: Diagnosis not present

## 2012-09-30 DIAGNOSIS — M999 Biomechanical lesion, unspecified: Secondary | ICD-10-CM | POA: Diagnosis not present

## 2012-09-30 DIAGNOSIS — M5137 Other intervertebral disc degeneration, lumbosacral region: Secondary | ICD-10-CM | POA: Diagnosis not present

## 2012-10-01 DIAGNOSIS — M5137 Other intervertebral disc degeneration, lumbosacral region: Secondary | ICD-10-CM | POA: Diagnosis not present

## 2012-10-01 DIAGNOSIS — M999 Biomechanical lesion, unspecified: Secondary | ICD-10-CM | POA: Diagnosis not present

## 2012-10-05 DIAGNOSIS — M999 Biomechanical lesion, unspecified: Secondary | ICD-10-CM | POA: Diagnosis not present

## 2012-10-05 DIAGNOSIS — M5137 Other intervertebral disc degeneration, lumbosacral region: Secondary | ICD-10-CM | POA: Diagnosis not present

## 2012-10-07 DIAGNOSIS — M5137 Other intervertebral disc degeneration, lumbosacral region: Secondary | ICD-10-CM | POA: Diagnosis not present

## 2012-10-07 DIAGNOSIS — M999 Biomechanical lesion, unspecified: Secondary | ICD-10-CM | POA: Diagnosis not present

## 2012-10-08 DIAGNOSIS — M5137 Other intervertebral disc degeneration, lumbosacral region: Secondary | ICD-10-CM | POA: Diagnosis not present

## 2012-10-08 DIAGNOSIS — M999 Biomechanical lesion, unspecified: Secondary | ICD-10-CM | POA: Diagnosis not present

## 2012-10-09 ENCOUNTER — Other Ambulatory Visit: Payer: Self-pay | Admitting: Oncology

## 2012-10-09 DIAGNOSIS — C50919 Malignant neoplasm of unspecified site of unspecified female breast: Secondary | ICD-10-CM

## 2012-10-15 ENCOUNTER — Telehealth: Payer: Self-pay | Admitting: Oncology

## 2012-10-15 ENCOUNTER — Other Ambulatory Visit: Payer: Self-pay | Admitting: *Deleted

## 2012-10-15 DIAGNOSIS — C50919 Malignant neoplasm of unspecified site of unspecified female breast: Secondary | ICD-10-CM

## 2012-10-15 NOTE — Telephone Encounter (Signed)
Called pt, left message regarding mammogram, called nurse and left message pt needs U/S for June 2014 , mammogram for June has been scheduled

## 2012-11-03 DIAGNOSIS — Z79899 Other long term (current) drug therapy: Secondary | ICD-10-CM | POA: Diagnosis not present

## 2012-11-03 DIAGNOSIS — M545 Low back pain: Secondary | ICD-10-CM | POA: Diagnosis not present

## 2012-11-03 DIAGNOSIS — M1990 Unspecified osteoarthritis, unspecified site: Secondary | ICD-10-CM | POA: Diagnosis not present

## 2012-11-03 DIAGNOSIS — M069 Rheumatoid arthritis, unspecified: Secondary | ICD-10-CM | POA: Diagnosis not present

## 2012-11-03 DIAGNOSIS — M2550 Pain in unspecified joint: Secondary | ICD-10-CM | POA: Diagnosis not present

## 2012-12-14 ENCOUNTER — Other Ambulatory Visit: Payer: Medicare Other | Admitting: Lab

## 2012-12-14 ENCOUNTER — Ambulatory Visit: Payer: Medicare Other | Admitting: Oncology

## 2012-12-14 ENCOUNTER — Encounter: Payer: Self-pay | Admitting: Oncology
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
## 2012-12-19 DIAGNOSIS — N390 Urinary tract infection, site not specified: Secondary | ICD-10-CM | POA: Diagnosis not present

## 2012-12-19 DIAGNOSIS — R300 Dysuria: Secondary | ICD-10-CM | POA: Diagnosis not present

## 2012-12-28 DIAGNOSIS — F3342 Major depressive disorder, recurrent, in full remission: Secondary | ICD-10-CM | POA: Diagnosis not present

## 2012-12-30 ENCOUNTER — Telehealth: Payer: Self-pay | Admitting: *Deleted

## 2012-12-30 NOTE — Telephone Encounter (Signed)
lm appt d/t was given for 04/20/2013 @ 3:15pm.

## 2013-01-06 DIAGNOSIS — M1990 Unspecified osteoarthritis, unspecified site: Secondary | ICD-10-CM | POA: Diagnosis not present

## 2013-01-06 DIAGNOSIS — M069 Rheumatoid arthritis, unspecified: Secondary | ICD-10-CM | POA: Diagnosis not present

## 2013-01-06 DIAGNOSIS — M2550 Pain in unspecified joint: Secondary | ICD-10-CM | POA: Diagnosis not present

## 2013-01-06 DIAGNOSIS — M76899 Other specified enthesopathies of unspecified lower limb, excluding foot: Secondary | ICD-10-CM | POA: Diagnosis not present

## 2013-01-15 DIAGNOSIS — I5020 Unspecified systolic (congestive) heart failure: Secondary | ICD-10-CM | POA: Diagnosis not present

## 2013-01-15 DIAGNOSIS — I10 Essential (primary) hypertension: Secondary | ICD-10-CM | POA: Diagnosis not present

## 2013-01-15 DIAGNOSIS — E119 Type 2 diabetes mellitus without complications: Secondary | ICD-10-CM | POA: Diagnosis not present

## 2013-01-18 DIAGNOSIS — M47817 Spondylosis without myelopathy or radiculopathy, lumbosacral region: Secondary | ICD-10-CM | POA: Diagnosis not present

## 2013-01-18 DIAGNOSIS — IMO0002 Reserved for concepts with insufficient information to code with codable children: Secondary | ICD-10-CM | POA: Diagnosis not present

## 2013-03-22 ENCOUNTER — Other Ambulatory Visit: Payer: Medicare Other

## 2013-03-24 DIAGNOSIS — M069 Rheumatoid arthritis, unspecified: Secondary | ICD-10-CM | POA: Diagnosis not present

## 2013-03-24 DIAGNOSIS — M545 Low back pain: Secondary | ICD-10-CM | POA: Diagnosis not present

## 2013-03-24 DIAGNOSIS — M2550 Pain in unspecified joint: Secondary | ICD-10-CM | POA: Diagnosis not present

## 2013-03-24 DIAGNOSIS — M76899 Other specified enthesopathies of unspecified lower limb, excluding foot: Secondary | ICD-10-CM | POA: Diagnosis not present

## 2013-03-30 DIAGNOSIS — M48061 Spinal stenosis, lumbar region without neurogenic claudication: Secondary | ICD-10-CM | POA: Diagnosis not present

## 2013-03-30 DIAGNOSIS — M545 Low back pain, unspecified: Secondary | ICD-10-CM | POA: Diagnosis not present

## 2013-03-31 ENCOUNTER — Other Ambulatory Visit: Payer: Self-pay | Admitting: Family Medicine

## 2013-03-31 DIAGNOSIS — M545 Low back pain, unspecified: Secondary | ICD-10-CM

## 2013-03-31 DIAGNOSIS — F3342 Major depressive disorder, recurrent, in full remission: Secondary | ICD-10-CM | POA: Diagnosis not present

## 2013-04-01 ENCOUNTER — Ambulatory Visit
Admission: RE | Admit: 2013-04-01 | Discharge: 2013-04-01 | Disposition: A | Discharge status: Home / Self Care | Payer: Medicare Other | Source: Ambulatory Visit | Attending: Family Medicine | Admitting: Family Medicine

## 2013-04-01 DIAGNOSIS — M5137 Other intervertebral disc degeneration, lumbosacral region: Secondary | ICD-10-CM | POA: Diagnosis not present

## 2013-04-01 DIAGNOSIS — M4120 Other idiopathic scoliosis, site unspecified: Secondary | ICD-10-CM | POA: Diagnosis not present

## 2013-04-01 DIAGNOSIS — M545 Low back pain, unspecified: Secondary | ICD-10-CM

## 2013-04-12 DIAGNOSIS — M47817 Spondylosis without myelopathy or radiculopathy, lumbosacral region: Secondary | ICD-10-CM | POA: Diagnosis not present

## 2013-04-12 DIAGNOSIS — M545 Low back pain: Secondary | ICD-10-CM | POA: Diagnosis not present

## 2013-04-12 DIAGNOSIS — IMO0002 Reserved for concepts with insufficient information to code with codable children: Secondary | ICD-10-CM | POA: Diagnosis not present

## 2013-04-12 DIAGNOSIS — M48061 Spinal stenosis, lumbar region without neurogenic claudication: Secondary | ICD-10-CM | POA: Diagnosis not present

## 2013-04-15 ENCOUNTER — Telehealth: Payer: Self-pay | Admitting: Oncology

## 2013-04-15 DIAGNOSIS — R300 Dysuria: Secondary | ICD-10-CM | POA: Diagnosis not present

## 2013-04-15 NOTE — Telephone Encounter (Signed)
Called pt and left message r/s from 7/1 to 7/14 due to MD call day per MD

## 2013-04-20 ENCOUNTER — Ambulatory Visit: Payer: Medicare Other | Admitting: Oncology

## 2013-04-27 ENCOUNTER — Other Ambulatory Visit: Payer: Self-pay | Admitting: Family Medicine

## 2013-04-27 ENCOUNTER — Ambulatory Visit
Admission: RE | Admit: 2013-04-27 | Discharge: 2013-04-27 | Disposition: A | Discharge status: Home / Self Care | Payer: Medicare Other | Source: Ambulatory Visit | Attending: Family Medicine | Admitting: Family Medicine

## 2013-04-27 DIAGNOSIS — K5900 Constipation, unspecified: Secondary | ICD-10-CM | POA: Diagnosis not present

## 2013-04-27 DIAGNOSIS — R109 Unspecified abdominal pain: Secondary | ICD-10-CM | POA: Diagnosis not present

## 2013-04-27 DIAGNOSIS — Z1331 Encounter for screening for depression: Secondary | ICD-10-CM | POA: Diagnosis not present

## 2013-04-29 ENCOUNTER — Telehealth: Payer: Self-pay | Admitting: Oncology

## 2013-04-29 DIAGNOSIS — IMO0002 Reserved for concepts with insufficient information to code with codable children: Secondary | ICD-10-CM | POA: Diagnosis not present

## 2013-04-29 DIAGNOSIS — M5412 Radiculopathy, cervical region: Secondary | ICD-10-CM | POA: Diagnosis not present

## 2013-04-29 DIAGNOSIS — R634 Abnormal weight loss: Secondary | ICD-10-CM | POA: Diagnosis not present

## 2013-04-29 DIAGNOSIS — G5610 Other lesions of median nerve, unspecified upper limb: Secondary | ICD-10-CM | POA: Diagnosis not present

## 2013-04-29 DIAGNOSIS — G609 Hereditary and idiopathic neuropathy, unspecified: Secondary | ICD-10-CM | POA: Diagnosis not present

## 2013-04-29 DIAGNOSIS — G43909 Migraine, unspecified, not intractable, without status migrainosus: Secondary | ICD-10-CM | POA: Diagnosis not present

## 2013-04-29 DIAGNOSIS — R209 Unspecified disturbances of skin sensation: Secondary | ICD-10-CM | POA: Diagnosis not present

## 2013-04-29 NOTE — Telephone Encounter (Signed)
pt husband and wants appt for 7/14 cancelled , pt will call to r/s appt , nurse notified

## 2013-05-03 ENCOUNTER — Ambulatory Visit: Payer: Medicare Other | Admitting: Oncology

## 2013-06-01 DIAGNOSIS — M549 Dorsalgia, unspecified: Secondary | ICD-10-CM | POA: Diagnosis not present

## 2013-07-12 DIAGNOSIS — Z79899 Other long term (current) drug therapy: Secondary | ICD-10-CM | POA: Diagnosis not present

## 2013-07-12 DIAGNOSIS — M2550 Pain in unspecified joint: Secondary | ICD-10-CM | POA: Diagnosis not present

## 2013-07-12 DIAGNOSIS — M069 Rheumatoid arthritis, unspecified: Secondary | ICD-10-CM | POA: Diagnosis not present

## 2013-07-12 DIAGNOSIS — M76899 Other specified enthesopathies of unspecified lower limb, excluding foot: Secondary | ICD-10-CM | POA: Diagnosis not present

## 2013-07-19 DIAGNOSIS — Z23 Encounter for immunization: Secondary | ICD-10-CM | POA: Diagnosis not present

## 2013-07-19 DIAGNOSIS — R197 Diarrhea, unspecified: Secondary | ICD-10-CM | POA: Diagnosis not present

## 2013-07-19 DIAGNOSIS — E039 Hypothyroidism, unspecified: Secondary | ICD-10-CM | POA: Diagnosis not present

## 2013-07-19 DIAGNOSIS — F3342 Major depressive disorder, recurrent, in full remission: Secondary | ICD-10-CM | POA: Diagnosis not present

## 2013-08-02 ENCOUNTER — Telehealth: Payer: Self-pay | Admitting: *Deleted

## 2013-08-02 DIAGNOSIS — I509 Heart failure, unspecified: Secondary | ICD-10-CM

## 2013-08-02 MED ORDER — FUROSEMIDE 20 MG PO TABS: 20.0000 mg | ORAL_TABLET | Freq: Every day | ORAL | Status: DC

## 2013-08-02 NOTE — Telephone Encounter (Signed)
Pt. husband called to request refill of Lasix 20mg  daily to be sent to [facility] on [address] [facility] for #90 if possible.

## 2013-08-02 NOTE — Telephone Encounter (Signed)
Done

## 2013-08-09 DIAGNOSIS — I10 Essential (primary) hypertension: Secondary | ICD-10-CM | POA: Diagnosis not present

## 2013-08-09 DIAGNOSIS — E119 Type 2 diabetes mellitus without complications: Secondary | ICD-10-CM | POA: Diagnosis not present

## 2013-08-09 DIAGNOSIS — I5020 Unspecified systolic (congestive) heart failure: Secondary | ICD-10-CM | POA: Diagnosis not present

## 2013-08-09 DIAGNOSIS — E7800 Pure hypercholesterolemia, unspecified: Secondary | ICD-10-CM | POA: Diagnosis not present

## 2013-08-09 DIAGNOSIS — E039 Hypothyroidism, unspecified: Secondary | ICD-10-CM | POA: Diagnosis not present

## 2013-08-17 DIAGNOSIS — M169 Osteoarthritis of hip, unspecified: Secondary | ICD-10-CM | POA: Diagnosis not present

## 2013-08-19 DIAGNOSIS — H35369 Drusen (degenerative) of macula, unspecified eye: Secondary | ICD-10-CM | POA: Diagnosis not present

## 2013-08-19 DIAGNOSIS — Z961 Presence of intraocular lens: Secondary | ICD-10-CM | POA: Diagnosis not present

## 2013-08-20 ENCOUNTER — Telehealth: Payer: Self-pay

## 2013-08-20 NOTE — Telephone Encounter (Signed)
called and spoke with pt husband. pt needs surgical clearance for hip surgery. notified pt husband that she would need to be seen before Dr.Smith could clear her.appt made for 08/31/13@9 :15 am. message also left for [facility]. Pt husband verbalized understanding.

## 2013-08-24 DIAGNOSIS — M169 Osteoarthritis of hip, unspecified: Secondary | ICD-10-CM | POA: Diagnosis not present

## 2013-08-28 ENCOUNTER — Encounter: Payer: Self-pay | Admitting: Cardiology

## 2013-08-28 ENCOUNTER — Encounter: Payer: Self-pay | Admitting: Interventional Cardiology

## 2013-08-28 DIAGNOSIS — R0602 Shortness of breath: Secondary | ICD-10-CM | POA: Insufficient documentation

## 2013-08-28 DIAGNOSIS — I5032 Chronic diastolic (congestive) heart failure: Secondary | ICD-10-CM | POA: Insufficient documentation

## 2013-08-28 DIAGNOSIS — F329 Major depressive disorder, single episode, unspecified: Secondary | ICD-10-CM | POA: Insufficient documentation

## 2013-08-31 ENCOUNTER — Telehealth: Payer: Self-pay | Admitting: Interventional Cardiology

## 2013-08-31 ENCOUNTER — Telehealth: Payer: Self-pay

## 2013-08-31 ENCOUNTER — Ambulatory Visit (INDEPENDENT_AMBULATORY_CARE_PROVIDER_SITE_OTHER): Payer: Medicare Other | Admitting: Interventional Cardiology

## 2013-08-31 ENCOUNTER — Encounter: Payer: Self-pay | Admitting: Interventional Cardiology

## 2013-08-31 VITALS — BP 122/62 | HR 82 | Ht 62.0 in | Wt 146.0 lb

## 2013-08-31 DIAGNOSIS — M1611 Unilateral primary osteoarthritis, right hip: Secondary | ICD-10-CM | POA: Insufficient documentation

## 2013-08-31 DIAGNOSIS — I10 Essential (primary) hypertension: Secondary | ICD-10-CM | POA: Diagnosis not present

## 2013-08-31 DIAGNOSIS — E119 Type 2 diabetes mellitus without complications: Secondary | ICD-10-CM

## 2013-08-31 DIAGNOSIS — I5022 Chronic systolic (congestive) heart failure: Secondary | ICD-10-CM

## 2013-08-31 DIAGNOSIS — Z01810 Encounter for preprocedural cardiovascular examination: Secondary | ICD-10-CM

## 2013-08-31 DIAGNOSIS — D649 Anemia, unspecified: Secondary | ICD-10-CM

## 2013-08-31 DIAGNOSIS — M169 Osteoarthritis of hip, unspecified: Secondary | ICD-10-CM

## 2013-08-31 NOTE — Progress Notes (Signed)
Patient ID: Burley Saver, female   DOB: August 24, 1936, 76 y.o.   MRN: [id number]    [address] [place], [place]  [address] Phone: [phone] Fax:  [phone]  Date:  08/31/2013   ID:  Burley Saver, DOB July 04, 1936, MRN [id number]  PCP:  Allean Found, MD   ASSESSMENT:  1. Chronic systolic heart failure, nonischemic. LVEF 35-40% by coronary angiography/heart catheterization 2013. Coronary is widely patent. 2. Preoperative cardiovascular evaluation 3. Hypertension, controlled 4. Hyperlipidemia, on therapy 5. Right hip arthritis, has upcoming surgery scheduled for replacement.  PLAN:  1. Cleared for upcoming right hip replacement and will be at moderate risk for CV complications (1-5% for development of heart failure if excessive IV fluids or ischemic stress of anemia from blood loss). 2. No change in therapy or workup needed to proceed.   SUBJECTIVE: SAE HANDRICH is a 77 y.o. female is doing well. She has lost nearly 80 pounds and suffers med her. Her diabetes diagnosis has been retracted. She is no longer on hypoglycemic therapy. She has 2 pillow orthopnea. There is no peripheral edema. She is not limited by shortness of breath. She denies chest pain. She has no history of coronary disease.  The patient is having terrible right hip pain and is to undergo right hip replacement by Dr. Jordan Hawks.   Wt Readings from Last 3 Encounters:  08/31/13 146 lb (66.225 kg)  04/03/12 183 lb (83.008 kg)  04/03/12 183 lb (83.008 kg)     Past Medical History  Diagnosis Date  . Rheumatoid arthritis(714.0) 12/16/2011  . Multinodular goiter (nontoxic) 12/16/2011  . Hypothyroidism (acquired) 12/16/2011  . DM type 2 (diabetes mellitus, type 2) 12/16/2011  . Normochromic normocytic anemia 12/16/2011  . Cancer     breast, 20 years ago  . Shortness of breath   . Anemia   . Headache(784.0)   . CHF (congestive heart failure)   . FHx: migraine headaches   . Psoriasis   .  Depression   . UTI (lower urinary tract infection)   . Chronic systolic heart failure     Current Outpatient Prescriptions  Medication Sig Dispense Refill  . carvedilol (COREG) 6.25 MG tablet Take 1 tablet (6.25 mg total) by mouth 2 (two) times daily with a meal.  60 tablet  0  . clonazePAM (KLONOPIN) 0.5 MG tablet Take 0.5 mg by mouth as directed. 1 tablet in the am and 2 tablets in the pm.      . doxepin (SINEQUAN) 25 MG capsule Take 25 mg by mouth at bedtime.      . DULoxetine (CYMBALTA) 30 MG capsule Take 30 mg by mouth daily.      Marland Kitchen etanercept (ENBREL) 25 MG injection Inject 50 mg into the skin once a week. Takes every thursday      . folic acid (FOLVITE) 1 MG tablet Take 1 mg by mouth daily.      . furosemide (LASIX) 20 MG tablet Take 1 tablet (20 mg total) by mouth daily.  90 tablet  1  . HYDROcodone-acetaminophen (NORCO/VICODIN) 5-325 MG per tablet Take 1 tablet by mouth 2 (two) times daily as needed for moderate pain.      Marland Kitchen levothyroxine (SYNTHROID, LEVOTHROID) 125 MCG tablet Take 125 mcg by mouth daily before breakfast.      . losartan (COZAAR) 25 MG tablet Take 25 mg by mouth daily.      . potassium chloride SA (K-DUR,KLOR-CON) 20 MEQ tablet Take 2 tablets (  40 mEq total) by mouth 2 (two) times daily.  60 tablet  0  . predniSONE (DELTASONE) 1 MG tablet Take 2 mg by mouth daily with breakfast.       . tiotropium (SPIRIVA) 18 MCG inhalation capsule Place 18 mcg into inhaler and inhale daily. 1 puff daily       No current facility-administered medications for this visit.    Allergies:    Allergies  Allergen Reactions  . Wellbutrin [Bupropion] Other (See Comments)    mental changes and increase anger    History   Social History  . Marital Status: Married    Spouse Name: N/A    Number of Children: N/A  . Years of Education: N/A   Occupational History  . Not on file.   Social History Main Topics  . Smoking status: Former Games developer  . Smokeless tobacco: Never Used  .  Alcohol Use: Yes     Comment: occasional  . Drug Use: No  . Sexual Activity: No   Other Topics Concern  . Not on file   Social History Narrative  . No narrative on file   Family History  Problem Relation Age of Onset  . Heart disease Mother      ROS:  Please see the history of present illness.   Denies recent falls. No transient neurological complaints. There is no peripheral edema. She sleeps on 2 pillows. She denies chest pain. There is some memory difficulty.   All other systems reviewed and negative.   OBJECTIVE: VS:  BP 122/62  Pulse 82  Ht 5\' 2"  (1.575 m)  Wt 146 lb (66.225 kg)  BMI 26.70 kg/m2 Well nourished, well developed, in no acute distress, with remarkable weight loss. HEENT: normal Neck: JVD absent. Carotid bruit 2+  Cardiac:  normal S1, S2; RRR; no murmur Lungs:  clear to auscultation bilaterally, no wheezing, rhonchi or rales Abd: soft, nontender, no hepatomegaly Ext: Edema  Absent . Pulses 1-2+  Skin: warm and dry Neuro:  CNs 2-12 intact, no focal abnormalities noted  EKG:  Normal sinus rhythm, left anterior hemiblock, QS pattern V1 through V3. First degree A-V block. No change from prior tracing.       Signed, Darci Needle III, MD 08/31/2013 1:25 PM

## 2013-08-31 NOTE — Telephone Encounter (Signed)
pt cardiac clearnace faxed to [facility] attn:Wendy [place] fax [phone]

## 2013-08-31 NOTE — Telephone Encounter (Signed)
Received request from Nurse fax box, documents faxed for surgical clearance. To: [facility] Fax number: [phone] Attention: 08/31/13/KM

## 2013-08-31 NOTE — Patient Instructions (Signed)
Your physician recommends that you continue on your current medications as directed. Please refer to the Current Medication list given to you today.  You have been cleared for surgery with Dr.Aluisio  Keep your appt for March 2015

## 2013-09-21 ENCOUNTER — Other Ambulatory Visit: Payer: Self-pay | Admitting: Orthopedic Surgery

## 2013-09-21 NOTE — Progress Notes (Signed)
Preoperative surgical orders have been place into the Epic hospital system for Olivia Mullen on 09/21/2013, 1:08 PM  by Patrica Duel for surgery on 10-11-2013.  Preop Total Hip - Anterior Approach orders including Experel Injecion, PO Tylenol, and IV Decadron as long as there are no contraindications to the above medications. Avel Peace, PA-C

## 2013-09-22 DIAGNOSIS — E039 Hypothyroidism, unspecified: Secondary | ICD-10-CM | POA: Diagnosis not present

## 2013-09-22 DIAGNOSIS — N390 Urinary tract infection, site not specified: Secondary | ICD-10-CM | POA: Diagnosis not present

## 2013-09-22 DIAGNOSIS — E7800 Pure hypercholesterolemia, unspecified: Secondary | ICD-10-CM | POA: Diagnosis not present

## 2013-09-22 DIAGNOSIS — R300 Dysuria: Secondary | ICD-10-CM | POA: Diagnosis not present

## 2013-10-05 ENCOUNTER — Encounter (INDEPENDENT_AMBULATORY_CARE_PROVIDER_SITE_OTHER): Payer: Self-pay

## 2013-10-05 ENCOUNTER — Encounter (HOSPITAL_COMMUNITY): Payer: Self-pay | Admitting: Pharmacy Technician

## 2013-10-05 ENCOUNTER — Ambulatory Visit (HOSPITAL_COMMUNITY)
Admission: RE | Admit: 2013-10-05 | Discharge: 2013-10-05 | Disposition: A | Discharge status: Home / Self Care | Payer: Medicare Other | Source: Ambulatory Visit | Attending: Orthopedic Surgery | Admitting: Orthopedic Surgery

## 2013-10-05 ENCOUNTER — Encounter (HOSPITAL_COMMUNITY): Payer: Self-pay

## 2013-10-05 ENCOUNTER — Encounter (HOSPITAL_COMMUNITY)
Admission: RE | Admit: 2013-10-05 | Discharge: 2013-10-05 | Disposition: A | Discharge status: Home / Self Care | Payer: Medicare Other | Source: Ambulatory Visit | Attending: Orthopedic Surgery | Admitting: Orthopedic Surgery

## 2013-10-05 DIAGNOSIS — Z79899 Other long term (current) drug therapy: Secondary | ICD-10-CM | POA: Diagnosis not present

## 2013-10-05 DIAGNOSIS — Z01812 Encounter for preprocedural laboratory examination: Secondary | ICD-10-CM | POA: Diagnosis not present

## 2013-10-05 DIAGNOSIS — Z01818 Encounter for other preprocedural examination: Secondary | ICD-10-CM | POA: Diagnosis not present

## 2013-10-05 DIAGNOSIS — M169 Osteoarthritis of hip, unspecified: Secondary | ICD-10-CM | POA: Diagnosis not present

## 2013-10-05 DIAGNOSIS — I10 Essential (primary) hypertension: Secondary | ICD-10-CM | POA: Diagnosis not present

## 2013-10-05 DIAGNOSIS — M1610 Unilateral primary osteoarthritis, unspecified hip: Secondary | ICD-10-CM | POA: Diagnosis not present

## 2013-10-05 DIAGNOSIS — M069 Rheumatoid arthritis, unspecified: Secondary | ICD-10-CM | POA: Diagnosis not present

## 2013-10-05 DIAGNOSIS — M76899 Other specified enthesopathies of unspecified lower limb, excluding foot: Secondary | ICD-10-CM | POA: Diagnosis not present

## 2013-10-05 DIAGNOSIS — M2550 Pain in unspecified joint: Secondary | ICD-10-CM | POA: Diagnosis not present

## 2013-10-05 HISTORY — DX: Angina pectoris, unspecified: I20.9

## 2013-10-05 HISTORY — DX: Personal history of urinary calculi: Z87.442

## 2013-10-05 HISTORY — DX: Adverse effect of unspecified anesthetic, initial encounter: T41.45XA

## 2013-10-05 HISTORY — DX: Pneumonia, unspecified organism: J18.9

## 2013-10-05 HISTORY — DX: Other complications of anesthesia, initial encounter: T88.59XA

## 2013-10-05 HISTORY — DX: Pure hypercholesterolemia, unspecified: E78.00

## 2013-10-05 HISTORY — DX: Essential (primary) hypertension: I10

## 2013-10-05 HISTORY — DX: Anxiety disorder, unspecified: F41.9

## 2013-10-05 LAB — COMPREHENSIVE METABOLIC PANEL
ALT: 20 U/L (ref 0–35)
Albumin: 3.5 g/dL (ref 3.5–5.2)
BUN: 9 mg/dL (ref 6–23)
Chloride: 107 mEq/L (ref 96–112)
Creatinine, Ser: 0.69 mg/dL (ref 0.50–1.10)
GFR calc Af Amer: >90 mL/min (ref >90)
GFR calc non Af Amer: 83 mL/min — ABNORMAL LOW (ref >90)
Glucose, Bld: 96 mg/dL (ref 70–99)
Sodium: 140 mEq/L (ref 135–145)
Total Bilirubin: 0.3 mg/dL (ref 0.3–1.2)
Total Protein: 6.1 g/dL (ref 6.0–8.3)

## 2013-10-05 LAB — CBC
HCT: 35.3 % — ABNORMAL LOW (ref 36.0–46.0)
Hemoglobin: 11.4 g/dL — ABNORMAL LOW (ref 12.0–15.0)
MCHC: 32.3 g/dL (ref 30.0–36.0)
MCV: 89.4 fL (ref 78.0–100.0)
RDW: 14.9 % (ref 11.5–15.5)

## 2013-10-05 LAB — PROTIME-INR
INR: 0.86 (ref 0.00–1.49)
Prothrombin Time: 11.6 seconds (ref 11.6–15.2)

## 2013-10-05 LAB — SURGICAL PCR SCREEN
MRSA, PCR: NEGATIVE
Staphylococcus aureus: NEGATIVE

## 2013-10-05 LAB — URINALYSIS, ROUTINE W REFLEX MICROSCOPIC
Bilirubin Urine: NEGATIVE
Ketones, ur: NEGATIVE mg/dL
Nitrite: NEGATIVE
Protein, ur: NEGATIVE mg/dL
Specific Gravity, Urine: 1.007 (ref 1.005–1.030)
Urobilinogen, UA: 0.2 mg/dL (ref 0.0–1.0)
pH: 5 (ref 5.0–8.0)

## 2013-10-05 NOTE — Patient Instructions (Addendum)
20 Olivia Mullen  10/05/2013   Your procedure is scheduled on: 10/11/13  Report to [facility] at 10:45 AM.  Call this number if you have problems the morning of surgery 336-: [phone]   Remember:   Do not eat food After Midnight, clear liquids from midnight until 07:45 am on 10/11/13 then nothing.      Take these medicines the morning of surgery with A SIP OF WATER: carvedilol, klonopin, duloxetine, synthroid, prednisone, hydrocodone if needed   Do not wear jewelry, make-up or nail polish.  Do not wear lotions, powders, or perfumes. You may wear deodorant.  Do not shave 48 hours prior to surgery. Men may shave face and neck.  Do not bring valuables to the hospital.  Contacts, dentures or bridgework may not be worn into surgery.  Leave suitcase in the car. After surgery it may be brought to your room.  For patients admitted to the hospital, checkout time is 11:00 AM the day of discharge.   Please read over the following fact sheets that you were given: MRSA Information, clear liquids fact sheet, blood fact sheet, incentive spirometry fact sheet [name], RN  pre op nurse call if needed [phone]    FAILURE TO FOLLOW THESE INSTRUCTIONS MAY RESULT IN CANCELLATION OF YOUR SURGERY   Patient Signature: ___________________________________________

## 2013-10-05 NOTE — Progress Notes (Signed)
Surgery clearance note Dr. Merri Brunette 08/09/13 on chart, surgery clearance note Dr. Nickola Major 07/12/13 on chart, EKG 08/31/13 on EPIC

## 2013-10-06 NOTE — H&P (Signed)
TOTAL HIP ADMISSION H&P  Patient is admitted for right total hip arthroplasty.  Subjective:  Chief Complaint: right hip pain  HPI: Olivia Mullen, 77 y.o. female, has a history of pain and functional disability in the right hip(s) due to arthritis and patient has failed non-surgical conservative treatments for greater than 12 weeks to include NSAID's and/or analgesics, corticosteriod injections, use of assistive devices and activity modification.  Onset of symptoms was gradual starting 3 years ago with gradually worsening course since that time.The patient noted no past surgery on the right hip(s).  Patient currently rates pain in the right hip at 8 out of 10 with activity. Patient has night pain, worsening of pain with activity and weight bearing, pain that interfers with activities of daily living and pain with passive range of motion. Patient has evidence of periarticular osteophytes and joint space narrowing by imaging studies.  There is no current active infection.  Patient Active Problem List   Diagnosis Date Noted  . Osteoarthritis of right hip 08/31/2013  . Depression   . Chronic systolic heart failure   . Shortness of breath   . Hypokalemia 03/08/2012  . Hypertension 03/08/2012  . Acute systolic CHF (congestive heart failure) 03/07/2012  . CHF (congestive heart failure) 03/06/2012  . Breast cancer, stage 2 12/16/2011  . Rheumatoid arthritis 12/16/2011  . Multinodular goiter (nontoxic) 12/16/2011  . Hypothyroidism (acquired) 12/16/2011  . DM type 2 (diabetes mellitus, type 2) 12/16/2011  . Normochromic normocytic anemia 12/16/2011   Past Medical History  Diagnosis Date  . Rheumatoid arthritis(714.0) 12/16/2011  . Multinodular goiter (nontoxic) 12/16/2011  . Hypothyroidism (acquired) 12/16/2011  . Normochromic normocytic anemia 12/16/2011  . Anemia   . CHF (congestive heart failure)   . Psoriasis   . Depression   . UTI (lower urinary tract infection)   . Chronic systolic  heart failure   . Anginal pain     long time ago  . Hypertension   . Hypercholesteremia     "doctor wants to get down to 100, now its 108"  . Anxiety   . Pneumonia     hx of, years ago  . Headache(784.0)     migraines  . History of kidney stones long time ago  . Cancer     right breast, 20 years ago  . Complication of anesthesia "long time ago"    one time woke up during surgery     Past Surgical History  Procedure Laterality Date  . Mastectomy      right  . Carpal tunnel release Right   . Thyroid surgery    . Abdominal hysterectomy    . Left shoulder      "replaced joint with rod"  . Cholecystectomy    . Skin graft Right     , Calf  . Appendectomy    . Breast surgery  20 years ago    reduced left breast, reconstruction  . Cataract extraction Bilateral   . Tonsillectomy       Current outpatient prescriptions: aspirin-acetaminophen-caffeine (EXCEDRIN MIGRAINE) 250-250-65 MG per tablet, Take 1 tablet by mouth every 6 (six) hours as needed for headache., Disp: , Rfl: ;   carvedilol (COREG) 6.25 MG tablet, Take 6.25 mg by mouth 2 (two) times daily with a meal., Disp: , Rfl: ;   Cholecalciferol (VITAMIN D) 2000 UNITS tablet, Take 2,000 Units by mouth daily., Disp: , Rfl:  clonazePAM (KLONOPIN) 0.5 MG tablet, Take 0.5-1 mg by mouth 2 (two) times daily. 1  tablet in the am and 2 tablets in the pm., Disp: , Rfl: ;   doxepin (SINEQUAN) 50 MG capsule, Take 50 mg by mouth at bedtime., Disp: , Rfl: ;  DULoxetine (CYMBALTA) 60 MG capsule, Take 60 mg by mouth 2 (two) times daily., Disp: , Rfl: ;   etanercept (ENBREL) 25 MG injection, Inject 50 mg into the skin once a week. Takes every thursday, Disp: , Rfl:  folic acid (FOLVITE) 1 MG tablet, Take 1 mg by mouth daily., Disp: , Rfl: ;  furosemide (LASIX) 20 MG tablet, Take 20 mg by mouth every morning., Disp: , Rfl: ;   HYDROcodone-acetaminophen (NORCO) 10-325 MG per tablet, Take 1 tablet by mouth every 6 (six) hours as needed for moderate  pain., Disp: , Rfl: ;   leflunomide (ARAVA) 20 MG tablet, Take 20 mg by mouth daily., Disp: , Rfl:  levothyroxine (SYNTHROID, LEVOTHROID) 88 MCG tablet, Take 88 mcg by mouth daily before breakfast., Disp: , Rfl: ;   osartan (COZAAR) 25 MG tablet, Take 25 mg by mouth every morning. , Disp: , Rfl: ;   Multiple Vitamin (MULTIVITAMIN WITH MINERALS) TABS tablet, Take 1 tablet by mouth daily., Disp: , Rfl: ;   potassium chloride SA (K-DUR,KLOR-CON) 20 MEQ tablet, Take 20 mEq by mouth 2 (two) times daily., Disp: , Rfl:  predniSONE (DELTASONE) 1 MG tablet, Take 1 mg by mouth daily with breakfast. , Disp: , Rfl: ;   simvastatin (ZOCOR) 20 MG tablet, Take 20 mg by mouth every evening., Disp: , Rfl:   Allergies  Allergen Reactions  . Gabapentin     unknown  . Isosorbide     unknown  . Wellbutrin [Bupropion] Other (See Comments)    mental changes and increase anger  . Zonisamide     unknown    History  Substance Use Topics  . Smoking status: Former Smoker -- 3.00 packs/day    Types: Cigarettes    Quit date: 10/21/1992  . Smokeless tobacco: Never Used  . Alcohol Use: Yes     Comment: occasional    Family History  Problem Relation Age of Onset  . Heart disease Mother      Review of Systems  Constitutional: Negative.   HENT: Positive for tinnitus. Negative for congestion, ear discharge, ear pain, hearing loss, nosebleeds and sore throat.   Eyes: Negative.   Respiratory: Negative.  Negative for stridor.   Cardiovascular: Negative.   Gastrointestinal: Positive for diarrhea. Negative for heartburn, nausea, vomiting, abdominal pain, constipation, blood in stool and melena.  Musculoskeletal: Positive for back pain and joint pain. Negative for falls, myalgias and neck pain.       Right hip pain  Skin: Negative.   Neurological: Positive for headaches. Negative for dizziness, tingling, tremors, sensory change, speech change, focal weakness, seizures and loss of consciousness.   Endo/Heme/Allergies: Negative.   Psychiatric/Behavioral: Negative.     Objective:  Physical Exam  Constitutional: She is oriented to person, place, and time. She appears well-developed and well-nourished. No distress.  HENT:  Head: Normocephalic and atraumatic.  Right Ear: External ear normal.  Left Ear: External ear normal.  Nose: Nose normal.  Mouth/Throat: Oropharynx is clear and moist.  Eyes: Conjunctivae and EOM are normal.  Neck: Normal range of motion. Neck supple.  Cardiovascular: Normal rate, regular rhythm, normal heart sounds and intact distal pulses.   No murmur heard. Respiratory: Effort normal and breath sounds normal. No respiratory distress. She has no wheezes.  GI: Soft. Bowel sounds  are normal. She exhibits no distension. There is no tenderness.  Musculoskeletal:       Right hip: She exhibits decreased range of motion, decreased strength and crepitus.       Left hip: Normal.       Right knee: Normal.       Left knee: Normal.       Right lower leg: She exhibits no tenderness and no swelling.       Left lower leg: She exhibits no tenderness and no swelling.  The left hip shows a normal range of motion with no discomfort. The right hip flexion is to 100. No internal or external rotation and only about 10 degrees of abduction. The knee exam is normal. The gait pattern is significantly antalgic on the right.  Neurological: She is alert and oriented to person, place, and time. She has normal strength and normal reflexes. No sensory deficit.  Skin: No rash noted. She is not diaphoretic. No erythema.  Psychiatric: She has a normal mood and affect. Her behavior is normal.    Vital signs in last 24 hours: Temp:  [98 F (36.7 C)] 98 F (36.7 C) (12/16 1230) Pulse Rate:  [80] 80 (12/16 1230) Resp:  [16] 16 (12/16 1230) BP: (139)/(52) 139/52 mmHg (12/16 1230) SpO2:  [98 %] 98 % (12/16 1230) Weight:  [66.679 kg (147 lb)] 66.679 kg (147 lb) (12/16 1230)   Imaging  Review Plain radiographs demonstrate severe degenerative joint disease of the right hip(s). The bone quality appears to be adequate for age and reported activity level.  Assessment/Plan:  End stage arthritis, right hip(s)  The patient history, physical examination, clinical judgement of the provider and imaging studies are consistent with end stage degenerative joint disease of the right hip(s) and total hip arthroplasty is deemed medically necessary. The treatment options including medical management, injection therapy, arthroscopy and arthroplasty were discussed at length. The risks and benefits of total hip arthroplasty were presented and reviewed. The risks due to aseptic loosening, infection, stiffness, dislocation/subluxation,  thromboembolic complications and other imponderables were discussed.  The patient acknowledged the explanation, agreed to proceed with the plan and consent was signed. Patient is being admitted for inpatient treatment for surgery, pain control, PT, OT, prophylactic antibiotics, VTE prophylaxis, progressive ambulation and ADL's and discharge planning.The patient is planning to be discharged home with home health services      [place], [place]

## 2013-10-11 ENCOUNTER — Inpatient Hospital Stay (HOSPITAL_COMMUNITY)
Admission: RE | Admit: 2013-10-11 | Discharge: 2013-10-13 | DRG: 470 | Disposition: A | Discharge status: Home Health | Payer: Medicare Other | Source: Ambulatory Visit | Attending: Orthopedic Surgery | Admitting: Orthopedic Surgery

## 2013-10-11 ENCOUNTER — Encounter (HOSPITAL_COMMUNITY)
Admission: RE | Disposition: A | Discharge status: Home Health | Payer: Self-pay | Source: Ambulatory Visit | Attending: Orthopedic Surgery

## 2013-10-11 ENCOUNTER — Inpatient Hospital Stay (HOSPITAL_COMMUNITY): Payer: Medicare Other | Admitting: Certified Registered Nurse Anesthetist

## 2013-10-11 ENCOUNTER — Encounter (HOSPITAL_COMMUNITY): Payer: Self-pay | Admitting: *Deleted

## 2013-10-11 ENCOUNTER — Inpatient Hospital Stay (HOSPITAL_COMMUNITY): Payer: Medicare Other

## 2013-10-11 ENCOUNTER — Encounter (HOSPITAL_COMMUNITY): Payer: Medicare Other | Admitting: Certified Registered Nurse Anesthetist

## 2013-10-11 DIAGNOSIS — F329 Major depressive disorder, single episode, unspecified: Secondary | ICD-10-CM | POA: Diagnosis present

## 2013-10-11 DIAGNOSIS — M25559 Pain in unspecified hip: Secondary | ICD-10-CM | POA: Diagnosis not present

## 2013-10-11 DIAGNOSIS — I509 Heart failure, unspecified: Secondary | ICD-10-CM | POA: Diagnosis not present

## 2013-10-11 DIAGNOSIS — M1610 Unilateral primary osteoarthritis, unspecified hip: Principal | ICD-10-CM | POA: Diagnosis present

## 2013-10-11 DIAGNOSIS — M169 Osteoarthritis of hip, unspecified: Secondary | ICD-10-CM | POA: Diagnosis present

## 2013-10-11 DIAGNOSIS — M069 Rheumatoid arthritis, unspecified: Secondary | ICD-10-CM | POA: Diagnosis present

## 2013-10-11 DIAGNOSIS — Z853 Personal history of malignant neoplasm of breast: Secondary | ICD-10-CM | POA: Diagnosis not present

## 2013-10-11 DIAGNOSIS — Z6827 Body mass index (BMI) 27.0-27.9, adult: Secondary | ICD-10-CM

## 2013-10-11 DIAGNOSIS — F411 Generalized anxiety disorder: Secondary | ICD-10-CM | POA: Diagnosis present

## 2013-10-11 DIAGNOSIS — E039 Hypothyroidism, unspecified: Secondary | ICD-10-CM | POA: Diagnosis present

## 2013-10-11 DIAGNOSIS — E042 Nontoxic multinodular goiter: Secondary | ICD-10-CM | POA: Diagnosis present

## 2013-10-11 DIAGNOSIS — Z471 Aftercare following joint replacement surgery: Secondary | ICD-10-CM | POA: Diagnosis not present

## 2013-10-11 DIAGNOSIS — I10 Essential (primary) hypertension: Secondary | ICD-10-CM | POA: Diagnosis present

## 2013-10-11 DIAGNOSIS — Z96649 Presence of unspecified artificial hip joint: Secondary | ICD-10-CM | POA: Diagnosis not present

## 2013-10-11 DIAGNOSIS — Z87891 Personal history of nicotine dependence: Secondary | ICD-10-CM

## 2013-10-11 DIAGNOSIS — I5032 Chronic diastolic (congestive) heart failure: Secondary | ICD-10-CM | POA: Diagnosis present

## 2013-10-11 DIAGNOSIS — D62 Acute posthemorrhagic anemia: Secondary | ICD-10-CM | POA: Diagnosis not present

## 2013-10-11 DIAGNOSIS — I5022 Chronic systolic (congestive) heart failure: Secondary | ICD-10-CM | POA: Diagnosis not present

## 2013-10-11 DIAGNOSIS — E119 Type 2 diabetes mellitus without complications: Secondary | ICD-10-CM | POA: Diagnosis present

## 2013-10-11 DIAGNOSIS — M1611 Unilateral primary osteoarthritis, right hip: Secondary | ICD-10-CM | POA: Diagnosis present

## 2013-10-11 DIAGNOSIS — R0602 Shortness of breath: Secondary | ICD-10-CM | POA: Diagnosis not present

## 2013-10-11 DIAGNOSIS — D649 Anemia, unspecified: Secondary | ICD-10-CM | POA: Diagnosis present

## 2013-10-11 DIAGNOSIS — F3289 Other specified depressive episodes: Secondary | ICD-10-CM | POA: Diagnosis present

## 2013-10-11 HISTORY — PX: TOTAL HIP ARTHROPLASTY: SHX124

## 2013-10-11 LAB — GLUCOSE, CAPILLARY: Glucose-Capillary: 79 mg/dL (ref 70–99)

## 2013-10-11 LAB — TYPE AND SCREEN: Antibody Screen: NEGATIVE

## 2013-10-11 SURGERY — ARTHROPLASTY, HIP, TOTAL, ANTERIOR APPROACH
Anesthesia: General | Site: Hip | Laterality: Right

## 2013-10-11 MED ORDER — LIDOCAINE HCL (CARDIAC) 20 MG/ML IV SOLN
INTRAVENOUS | Status: AC
  Filled 2013-10-11: qty 5

## 2013-10-11 MED ORDER — ETOMIDATE 2 MG/ML IV SOLN
INTRAVENOUS | Status: DC | PRN
  Administered 2013-10-11: 20 mg via INTRAVENOUS

## 2013-10-11 MED ORDER — HYDROMORPHONE HCL PF 2 MG/ML IJ SOLN
INTRAMUSCULAR | Status: AC
  Filled 2013-10-11: qty 1

## 2013-10-11 MED ORDER — ACETAMINOPHEN 500 MG PO TABS
1000.0000 mg | ORAL_TABLET | Freq: Four times a day (QID) | ORAL | Status: AC
  Administered 2013-10-11 – 2013-10-12 (×4): 1000 mg via ORAL
  Filled 2013-10-11 (×5): qty 2

## 2013-10-11 MED ORDER — FENTANYL CITRATE 0.05 MG/ML IJ SOLN
INTRAMUSCULAR | Status: AC
  Filled 2013-10-11: qty 5

## 2013-10-11 MED ORDER — METHOCARBAMOL 100 MG/ML IJ SOLN
500.0000 mg | Freq: Four times a day (QID) | INTRAVENOUS | Status: DC | PRN
  Administered 2013-10-11: 500 mg via INTRAVENOUS
  Filled 2013-10-11 (×2): qty 5

## 2013-10-11 MED ORDER — KCL IN DEXTROSE-NACL 20-5-0.9 MEQ/L-%-% IV SOLN
INTRAVENOUS | Status: DC
  Administered 2013-10-11: 19:00:00 via INTRAVENOUS
  Filled 2013-10-11 (×2): qty 1000

## 2013-10-11 MED ORDER — CEFAZOLIN SODIUM-DEXTROSE 2-3 GM-% IV SOLR
INTRAVENOUS | Status: AC
  Filled 2013-10-11: qty 50

## 2013-10-11 MED ORDER — ONDANSETRON HCL 4 MG/2ML IJ SOLN
INTRAMUSCULAR | Status: DC | PRN
  Administered 2013-10-11: 4 mg via INTRAVENOUS

## 2013-10-11 MED ORDER — LACTATED RINGERS IV SOLN: Freq: Once | INTRAVENOUS | Status: DC

## 2013-10-11 MED ORDER — CLONAZEPAM 0.5 MG PO TABS
0.5000 mg | ORAL_TABLET | Freq: Every day | ORAL | Status: DC
  Administered 2013-10-12 – 2013-10-13 (×2): 0.5 mg via ORAL
  Filled 2013-10-11 (×2): qty 1

## 2013-10-11 MED ORDER — CARVEDILOL 6.25 MG PO TABS
6.2500 mg | ORAL_TABLET | Freq: Two times a day (BID) | ORAL | Status: DC
  Administered 2013-10-12 – 2013-10-13 (×3): 6.25 mg via ORAL
  Filled 2013-10-11 (×5): qty 1

## 2013-10-11 MED ORDER — SUCCINYLCHOLINE CHLORIDE 20 MG/ML IJ SOLN
INTRAMUSCULAR | Status: AC
  Filled 2013-10-11: qty 1

## 2013-10-11 MED ORDER — CLONAZEPAM 1 MG PO TABS
1.0000 mg | ORAL_TABLET | Freq: Every day | ORAL | Status: DC
  Administered 2013-10-11 – 2013-10-12 (×2): 1 mg via ORAL
  Filled 2013-10-11 (×2): qty 1

## 2013-10-11 MED ORDER — RIVAROXABAN 10 MG PO TABS
10.0000 mg | ORAL_TABLET | Freq: Every day | ORAL | Status: DC
  Administered 2013-10-12 – 2013-10-13 (×2): 10 mg via ORAL
  Filled 2013-10-11 (×3): qty 1

## 2013-10-11 MED ORDER — SODIUM CHLORIDE 0.9 % IV SOLN
INTRAVENOUS | Status: DC
  Administered 2013-10-11: 18:00:00 via INTRAVENOUS

## 2013-10-11 MED ORDER — GLYCOPYRROLATE 0.2 MG/ML IJ SOLN
INTRAMUSCULAR | Status: AC
  Filled 2013-10-11: qty 3

## 2013-10-11 MED ORDER — ACETAMINOPHEN 650 MG RE SUPP: 650.0000 mg | Freq: Four times a day (QID) | RECTAL | Status: DC | PRN

## 2013-10-11 MED ORDER — MIDAZOLAM HCL 5 MG/5ML IJ SOLN
INTRAMUSCULAR | Status: DC | PRN
  Administered 2013-10-11 (×2): 1 mg via INTRAVENOUS

## 2013-10-11 MED ORDER — FLEET ENEMA 7-19 GM/118ML RE ENEM: 1.0000 | ENEMA | Freq: Once | RECTAL | Status: AC | PRN

## 2013-10-11 MED ORDER — SIMVASTATIN 20 MG PO TABS
20.0000 mg | ORAL_TABLET | Freq: Every evening | ORAL | Status: DC
Start: 2013-10-11 — End: 2013-10-13
  Administered 2013-10-11 – 2013-10-12 (×2): 20 mg via ORAL
  Filled 2013-10-11 (×3): qty 1

## 2013-10-11 MED ORDER — HYDROMORPHONE HCL PF 1 MG/ML IJ SOLN
INTRAMUSCULAR | Status: DC | PRN
  Administered 2013-10-11 (×2): 0.5 mg via INTRAVENOUS
  Administered 2013-10-11: 1 mg via INTRAVENOUS

## 2013-10-11 MED ORDER — MIDAZOLAM HCL 2 MG/2ML IJ SOLN
INTRAMUSCULAR | Status: AC
  Filled 2013-10-11: qty 2

## 2013-10-11 MED ORDER — ONDANSETRON HCL 4 MG/2ML IJ SOLN
INTRAMUSCULAR | Status: AC
  Filled 2013-10-11: qty 2

## 2013-10-11 MED ORDER — METOCLOPRAMIDE HCL 10 MG PO TABS: 5.0000 mg | ORAL_TABLET | Freq: Three times a day (TID) | ORAL | Status: DC | PRN

## 2013-10-11 MED ORDER — ETOMIDATE 2 MG/ML IV SOLN
INTRAVENOUS | Status: AC
  Filled 2013-10-11: qty 10

## 2013-10-11 MED ORDER — DEXAMETHASONE 4 MG PO TABS
10.0000 mg | ORAL_TABLET | Freq: Every day | ORAL | Status: AC
  Administered 2013-10-12: 10:00:00 10 mg via ORAL
  Filled 2013-10-11: qty 1

## 2013-10-11 MED ORDER — POTASSIUM CHLORIDE CRYS ER 20 MEQ PO TBCR
20.0000 meq | EXTENDED_RELEASE_TABLET | Freq: Two times a day (BID) | ORAL | Status: DC
  Administered 2013-10-11 – 2013-10-13 (×4): 20 meq via ORAL
  Filled 2013-10-11 (×5): qty 1

## 2013-10-11 MED ORDER — SODIUM CHLORIDE 0.9 % IJ SOLN
INTRAMUSCULAR | Status: DC | PRN
  Administered 2013-10-11: 16:00:00

## 2013-10-11 MED ORDER — DOCUSATE SODIUM 100 MG PO CAPS
100.0000 mg | ORAL_CAPSULE | Freq: Two times a day (BID) | ORAL | Status: DC
  Administered 2013-10-11 – 2013-10-13 (×4): 100 mg via ORAL

## 2013-10-11 MED ORDER — HYDROMORPHONE HCL PF 1 MG/ML IJ SOLN
INTRAMUSCULAR | Status: AC
  Filled 2013-10-11: qty 1

## 2013-10-11 MED ORDER — DOXEPIN HCL 50 MG PO CAPS
50.0000 mg | ORAL_CAPSULE | Freq: Every day | ORAL | Status: DC
  Administered 2013-10-11 – 2013-10-12 (×2): 50 mg via ORAL
  Filled 2013-10-11 (×3): qty 1

## 2013-10-11 MED ORDER — 0.9 % SODIUM CHLORIDE (POUR BTL) OPTIME
TOPICAL | Status: DC | PRN
  Administered 2013-10-11 (×2): 1000 mL

## 2013-10-11 MED ORDER — BUPIVACAINE LIPOSOME 1.3 % IJ SUSP
20.0000 mL | Freq: Once | INTRAMUSCULAR | Status: DC
  Filled 2013-10-11: qty 20

## 2013-10-11 MED ORDER — LIDOCAINE HCL (CARDIAC) 20 MG/ML IV SOLN
INTRAVENOUS | Status: DC | PRN
  Administered 2013-10-11: 50 mg via INTRAVENOUS

## 2013-10-11 MED ORDER — MENTHOL 3 MG MT LOZG: 1.0000 | LOZENGE | OROMUCOSAL | Status: DC | PRN

## 2013-10-11 MED ORDER — MORPHINE SULFATE 2 MG/ML IJ SOLN
1.0000 mg | INTRAMUSCULAR | Status: DC | PRN
  Administered 2013-10-12: 1 mg via INTRAVENOUS
  Filled 2013-10-11: qty 1

## 2013-10-11 MED ORDER — SUCCINYLCHOLINE CHLORIDE 20 MG/ML IJ SOLN
INTRAMUSCULAR | Status: DC | PRN
  Administered 2013-10-11: 100 mg via INTRAVENOUS

## 2013-10-11 MED ORDER — PHENOL 1.4 % MT LIQD: 1.0000 | OROMUCOSAL | Status: DC | PRN

## 2013-10-11 MED ORDER — NEOSTIGMINE METHYLSULFATE 1 MG/ML IJ SOLN
INTRAMUSCULAR | Status: DC | PRN
Start: 2013-10-11 — End: 2013-10-11
  Administered 2013-10-11: 5 mg via INTRAVENOUS

## 2013-10-11 MED ORDER — BISACODYL 10 MG RE SUPP: 10.0000 mg | Freq: Every day | RECTAL | Status: DC | PRN

## 2013-10-11 MED ORDER — OXYCODONE HCL 5 MG PO TABS
5.0000 mg | ORAL_TABLET | ORAL | Status: DC | PRN
  Administered 2013-10-11 – 2013-10-13 (×11): 10 mg via ORAL
  Filled 2013-10-11 (×11): qty 2

## 2013-10-11 MED ORDER — DEXAMETHASONE SODIUM PHOSPHATE 10 MG/ML IJ SOLN
INTRAMUSCULAR | Status: DC | PRN
  Administered 2013-10-11: 10 mg via INTRAVENOUS

## 2013-10-11 MED ORDER — SODIUM CHLORIDE 0.9 % IJ SOLN
INTRAMUSCULAR | Status: AC
  Filled 2013-10-11: qty 50

## 2013-10-11 MED ORDER — LACTATED RINGERS IV SOLN
INTRAVENOUS | Status: DC | PRN
  Administered 2013-10-11: 14:00:00 via INTRAVENOUS

## 2013-10-11 MED ORDER — PROMETHAZINE HCL 25 MG/ML IJ SOLN: 6.2500 mg | INTRAMUSCULAR | Status: DC | PRN

## 2013-10-11 MED ORDER — LEVOTHYROXINE SODIUM 88 MCG PO TABS
88.0000 ug | ORAL_TABLET | Freq: Every day | ORAL | Status: DC
  Administered 2013-10-12 – 2013-10-13 (×2): 88 ug via ORAL
  Filled 2013-10-11 (×3): qty 1

## 2013-10-11 MED ORDER — HYDROMORPHONE HCL PF 1 MG/ML IJ SOLN
0.2500 mg | INTRAMUSCULAR | Status: DC | PRN
  Administered 2013-10-11: 0.25 mg via INTRAVENOUS
  Administered 2013-10-11: 0.5 mg via INTRAVENOUS
  Administered 2013-10-11: 0.25 mg via INTRAVENOUS

## 2013-10-11 MED ORDER — ESMOLOL HCL 10 MG/ML IV SOLN
INTRAVENOUS | Status: DC | PRN
  Administered 2013-10-11: 20 mg via INTRAVENOUS

## 2013-10-11 MED ORDER — CEFAZOLIN SODIUM 1-5 GM-% IV SOLN
1.0000 g | Freq: Four times a day (QID) | INTRAVENOUS | Status: AC
  Administered 2013-10-11 – 2013-10-12 (×2): 1 g via INTRAVENOUS
  Filled 2013-10-11 (×2): qty 50

## 2013-10-11 MED ORDER — ROCURONIUM BROMIDE 100 MG/10ML IV SOLN
INTRAVENOUS | Status: AC
  Filled 2013-10-11: qty 1

## 2013-10-11 MED ORDER — FOLIC ACID 1 MG PO TABS
1.0000 mg | ORAL_TABLET | Freq: Every day | ORAL | Status: DC
  Administered 2013-10-11 – 2013-10-13 (×3): 1 mg via ORAL
  Filled 2013-10-11 (×3): qty 1

## 2013-10-11 MED ORDER — POLYETHYLENE GLYCOL 3350 17 G PO PACK: 17.0000 g | PACK | Freq: Every day | ORAL | Status: DC | PRN

## 2013-10-11 MED ORDER — NEOSTIGMINE METHYLSULFATE 1 MG/ML IJ SOLN
INTRAMUSCULAR | Status: AC
  Filled 2013-10-11: qty 10

## 2013-10-11 MED ORDER — ONDANSETRON HCL 4 MG PO TABS: 4.0000 mg | ORAL_TABLET | Freq: Four times a day (QID) | ORAL | Status: DC | PRN

## 2013-10-11 MED ORDER — PROPOFOL 10 MG/ML IV BOLUS
INTRAVENOUS | Status: AC
  Filled 2013-10-11: qty 20

## 2013-10-11 MED ORDER — METOCLOPRAMIDE HCL 5 MG/ML IJ SOLN: 5.0000 mg | Freq: Three times a day (TID) | INTRAMUSCULAR | Status: DC | PRN

## 2013-10-11 MED ORDER — FENTANYL CITRATE 0.05 MG/ML IJ SOLN
INTRAMUSCULAR | Status: DC | PRN
  Administered 2013-10-11 (×3): 50 ug via INTRAVENOUS
  Administered 2013-10-11: 100 ug via INTRAVENOUS

## 2013-10-11 MED ORDER — ACETAMINOPHEN 500 MG PO TABS
1000.0000 mg | ORAL_TABLET | Freq: Once | ORAL | Status: AC
  Administered 2013-10-11: 1000 mg via ORAL
  Filled 2013-10-11: qty 2

## 2013-10-11 MED ORDER — FUROSEMIDE 20 MG PO TABS
20.0000 mg | ORAL_TABLET | Freq: Every morning | ORAL | Status: DC
  Administered 2013-10-12 – 2013-10-13 (×2): 20 mg via ORAL
  Filled 2013-10-11 (×2): qty 1

## 2013-10-11 MED ORDER — CEFAZOLIN SODIUM-DEXTROSE 2-3 GM-% IV SOLR
2.0000 g | INTRAVENOUS | Status: AC
  Administered 2013-10-11: 2 g via INTRAVENOUS

## 2013-10-11 MED ORDER — DULOXETINE HCL 60 MG PO CPEP
60.0000 mg | ORAL_CAPSULE | Freq: Two times a day (BID) | ORAL | Status: DC
  Administered 2013-10-11 – 2013-10-13 (×4): 60 mg via ORAL
  Filled 2013-10-11 (×5): qty 1

## 2013-10-11 MED ORDER — ROCURONIUM BROMIDE 100 MG/10ML IV SOLN
INTRAVENOUS | Status: DC | PRN
  Administered 2013-10-11: 30 mg via INTRAVENOUS
  Administered 2013-10-11: 10 mg via INTRAVENOUS

## 2013-10-11 MED ORDER — METHOCARBAMOL 500 MG PO TABS
500.0000 mg | ORAL_TABLET | Freq: Four times a day (QID) | ORAL | Status: DC | PRN
  Administered 2013-10-12 – 2013-10-13 (×3): 500 mg via ORAL
  Filled 2013-10-11 (×4): qty 1

## 2013-10-11 MED ORDER — ONDANSETRON HCL 4 MG/2ML IJ SOLN: 4.0000 mg | Freq: Four times a day (QID) | INTRAMUSCULAR | Status: DC | PRN

## 2013-10-11 MED ORDER — DEXAMETHASONE SODIUM PHOSPHATE 10 MG/ML IJ SOLN
10.0000 mg | Freq: Every day | INTRAMUSCULAR | Status: AC
  Filled 2013-10-11: qty 1

## 2013-10-11 MED ORDER — PREDNISONE 1 MG PO TABS
1.0000 mg | ORAL_TABLET | Freq: Every day | ORAL | Status: DC
  Administered 2013-10-12 – 2013-10-13 (×2): 1 mg via ORAL
  Filled 2013-10-11 (×3): qty 1

## 2013-10-11 MED ORDER — TRAMADOL HCL 50 MG PO TABS: 50.0000 mg | ORAL_TABLET | Freq: Four times a day (QID) | ORAL | Status: DC | PRN

## 2013-10-11 MED ORDER — DEXAMETHASONE SODIUM PHOSPHATE 10 MG/ML IJ SOLN: 10.0000 mg | Freq: Once | INTRAMUSCULAR | Status: DC

## 2013-10-11 MED ORDER — DIPHENHYDRAMINE HCL 12.5 MG/5ML PO ELIX: 12.5000 mg | ORAL_SOLUTION | ORAL | Status: DC | PRN

## 2013-10-11 MED ORDER — STERILE WATER FOR IRRIGATION IR SOLN: Status: DC | PRN

## 2013-10-11 MED ORDER — ESMOLOL HCL 10 MG/ML IV SOLN
INTRAVENOUS | Status: AC
  Filled 2013-10-11: qty 10

## 2013-10-11 MED ORDER — ACETAMINOPHEN 325 MG PO TABS
650.0000 mg | ORAL_TABLET | Freq: Four times a day (QID) | ORAL | Status: DC | PRN
  Administered 2013-10-12: 20:00:00 650 mg via ORAL
  Filled 2013-10-11: qty 2

## 2013-10-11 MED ORDER — GLYCOPYRROLATE 0.2 MG/ML IJ SOLN
INTRAMUSCULAR | Status: DC | PRN
  Administered 2013-10-11: 0.6 mg via INTRAVENOUS

## 2013-10-11 MED ORDER — LOSARTAN POTASSIUM 25 MG PO TABS
25.0000 mg | ORAL_TABLET | Freq: Every morning | ORAL | Status: DC
  Administered 2013-10-12 – 2013-10-13 (×2): 25 mg via ORAL
  Filled 2013-10-11 (×2): qty 1

## 2013-10-11 MED ORDER — BUPIVACAINE HCL (PF) 0.25 % IJ SOLN
INTRAMUSCULAR | Status: AC
  Filled 2013-10-11: qty 30

## 2013-10-11 SURGICAL SUPPLY — 39 items
BAG ZIPLOCK 12X15 (MISCELLANEOUS) ×4 IMPLANT
BLADE SAW SGTL 18X1.27X75 (BLADE) ×2 IMPLANT
CAPT HIP PF MOP ×2 IMPLANT
DECANTER SPIKE VIAL GLASS SM (MISCELLANEOUS) ×2 IMPLANT
DRAPE C-ARM 42X120 X-RAY (DRAPES) ×2 IMPLANT
DRAPE STERI IOBAN 125X83 (DRAPES) ×2 IMPLANT
DRAPE U-SHAPE 47X51 STRL (DRAPES) ×6 IMPLANT
DRSG ADAPTIC 3X8 NADH LF (GAUZE/BANDAGES/DRESSINGS) ×2 IMPLANT
DRSG MEPILEX BORDER 4X4 (GAUZE/BANDAGES/DRESSINGS) ×2 IMPLANT
DRSG MEPILEX BORDER 4X8 (GAUZE/BANDAGES/DRESSINGS) ×2 IMPLANT
DURAPREP 26ML APPLICATOR (WOUND CARE) ×2 IMPLANT
ELECT BLADE 6.5 EXT (BLADE) ×2 IMPLANT
ELECT REM PT RETURN 9FT ADLT (ELECTROSURGICAL) ×2
ELECTRODE REM PT RTRN 9FT ADLT (ELECTROSURGICAL) ×1 IMPLANT
EVACUATOR 1/8 PVC DRAIN (DRAIN) ×2 IMPLANT
FACESHIELD LNG OPTICON STERILE (SAFETY) ×8 IMPLANT
GLOVE BIO SURGEON STRL SZ7.5 (GLOVE) ×2 IMPLANT
GLOVE BIO SURGEON STRL SZ8 (GLOVE) ×4 IMPLANT
GLOVE BIOGEL PI IND STRL 8 (GLOVE) ×2 IMPLANT
GLOVE BIOGEL PI INDICATOR 8 (GLOVE) ×2
GOWN PREVENTION PLUS LG XLONG (DISPOSABLE) ×2 IMPLANT
GOWN STRL REIN XL XLG (GOWN DISPOSABLE) ×2 IMPLANT
KIT BASIN OR (CUSTOM PROCEDURE TRAY) ×2 IMPLANT
NDL SAFETY ECLIPSE 18X1.5 (NEEDLE) ×2 IMPLANT
NEEDLE HYPO 18GX1.5 SHARP (NEEDLE) ×2
PACK TOTAL JOINT (CUSTOM PROCEDURE TRAY) ×2 IMPLANT
PADDING CAST COTTON 6X4 STRL (CAST SUPPLIES) ×2 IMPLANT
SPONGE GAUZE 4X4 12PLY (GAUZE/BANDAGES/DRESSINGS) IMPLANT
STRIP CLOSURE SKIN 1/2X4 (GAUZE/BANDAGES/DRESSINGS) ×2 IMPLANT
SUCTION FRAZIER 12FR DISP (SUCTIONS) IMPLANT
SUT ETHIBOND NAB CT1 #1 30IN (SUTURE) ×2 IMPLANT
SUT MNCRL AB 4-0 PS2 18 (SUTURE) ×2 IMPLANT
SUT VIC AB 2-0 CT1 27 (SUTURE) ×2
SUT VIC AB 2-0 CT1 TAPERPNT 27 (SUTURE) ×2 IMPLANT
SUT VLOC 180 0 24IN GS25 (SUTURE) ×2 IMPLANT
SYR 20CC LL (SYRINGE) ×2 IMPLANT
SYR 50ML LL SCALE MARK (SYRINGE) ×2 IMPLANT
TOWEL OR 17X26 10 PK STRL BLUE (TOWEL DISPOSABLE) ×2 IMPLANT
TRAY FOLEY CATH 14FRSI W/METER (CATHETERS) ×2 IMPLANT

## 2013-10-11 NOTE — Anesthesia Preprocedure Evaluation (Addendum)
Anesthesia Evaluation  Patient identified by MRN, date of birth, ID band Patient awake  General Assessment Comment: Osteoarthritis of right hip  08/31/2013   .  Depression     .  Chronic systolic heart failure     .  Shortness of breath     .  Hypokalemia  03/08/2012   .  Hypertension  03/08/2012   .  Acute systolic CHF (congestive heart failure)  03/07/2012   .  CHF (congestive heart failure)  03/06/2012   .  Breast cancer, stage 2  12/16/2011   .  Rheumatoid arthritis  12/16/2011   .  Multinodular goiter (nontoxic)  12/16/2011   .  Hypothyroidism (acquired)  12/16/2011   .  DM type 2 (diabetes mellitus, type 2)  12/16/2011   .  Normochromic normocytic anemia     Reviewed: Allergy & Precautions, H&P , NPO status , Patient's Chart, lab work & pertinent test results  History of Anesthesia Complications (+) history of anesthetic complications  Airway Mallampati: II TM Distance: >3 FB Neck ROM: Full    Dental no notable dental hx.    Pulmonary shortness of breath, pneumonia -, resolved, former smoker,  breath sounds clear to auscultation  Pulmonary exam normal       Cardiovascular hypertension, Pt. on medications and Pt. on home beta blockers + angina +CHF negative cardio ROS  Rhythm:Regular Rate:Normal  ECHO 03-06-12 reviewed. EF 20-25%.   LVEF 35-40% on heart cath 2013  Dr. Michaelle Copas clearance note from 08-31-13 reviewed. Moderate risk for cardiac complications.    Neuro/Psych  Headaches, PSYCHIATRIC DISORDERS Anxiety Depression    GI/Hepatic negative GI ROS, Neg liver ROS,   Endo/Other  diabetes, Type 2, Oral Hypoglycemic AgentsHypothyroidism   Renal/GU negative Renal ROS  negative genitourinary   Musculoskeletal  (+) Arthritis -, Rheumatoid disorders,    Abdominal   Peds negative pediatric ROS (+)  Hematology  (+) anemia ,   Anesthesia Other Findings   Reproductive/Obstetrics negative OB ROS                         Anesthesia Physical Anesthesia Plan  ASA: IV  Anesthesia Plan: General   Post-op Pain Management:    Induction: Intravenous  Airway Management Planned: Oral ETT  Additional Equipment:   Intra-op Plan:   Post-operative Plan: Extubation in OR  Informed Consent: I have reviewed the patients History and Physical, chart, labs and discussed the procedure including the risks, benefits and alternatives for the proposed anesthesia with the patient or authorized representative who has indicated his/her understanding and acceptance.   Dental advisory given  Plan Discussed with: CRNA  Anesthesia Plan Comments:        Anesthesia Quick Evaluation

## 2013-10-11 NOTE — Op Note (Signed)
OPERATIVE REPORT  PREOPERATIVE DIAGNOSIS: Osteoarthritis of the Right hip.   POSTOPERATIVE DIAGNOSIS: Osteoarthritis of the Right  hip.   PROCEDURE: Right total hip arthroplasty, anterior approach.   SURGEON: Ollen Gross, MD   ASSISTANT: Avel Peace, PA-C  ANESTHESIA:  General  ESTIMATED BLOOD LOSS:-300 ml  DRAINS: Hemovac x1.   COMPLICATIONS: None   CONDITION: PACU - hemodynamically stable.   BRIEF CLINICAL NOTE: Olivia Mullen is a 77 y.o. female who has advanced end-  stage arthritis of his Right  hip with progressively worsening pain and  dysfunction.The patient has failed nonoperative management and presents for  total hip arthroplasty.   PROCEDURE IN DETAIL: After successful administration of spinal  anesthetic, the traction boots for the [facility] bed were placed on both  feet and the patient was placed onto the [facility] bed, boots placed into the leg  holders. The Right hip was then isolated from the perineum with plastic  drapes and prepped and draped in the usual sterile fashion. ASIS and  greater trochanter were marked and a oblique incision was made, starting  at about 1 cm lateral and 2 cm distal to the ASIS and coursing towards  the anterior cortex of the femur. The skin was cut with a 10 blade  through subcutaneous tissue to the level of the fascia overlying the  tensor fascia lata muscle. The fascia was then incised in line with the  incision at the junction of the anterior third and posterior 2/3rd. The  muscle was teased off the fascia and then the interval between the TFL  and the rectus was developed. The Hohmann retractor was then placed at  the top of the femoral neck over the capsule. The vessels overlying the  capsule were cauterized and the fat on top of the capsule was removed.  A Hohmann retractor was then placed anterior underneath the rectus  femoris to give exposure to the entire anterior capsule. A T-shaped  capsulotomy was performed.  The edges were tagged and the femoral head  was identified.       Osteophytes are removed off the superior acetabulum.  The femoral neck was then cut in situ with an oscillating saw. Traction  was then applied to the left lower extremity utilizing the [facility]  traction. The femoral head was then removed. Retractors were placed  around the acetabulum and then circumferential removal of the labrum was  performed. Osteophytes were also removed. Reaming starts at 43 mm to  medialize and  Increased in 2 mm increments to 49 mm. We reamed in  approximately 40 degrees of abduction, 20 degrees anteversion. A 50 mm  pinnacle acetabular shell was then impacted in anatomic position under  fluoroscopic guidance with excellent purchase. We did not need to place  any additional dome screws. A 32 mm neutral + 4 marathon liner was then  placed into the acetabular shell.       The femoral lift was then placed along the lateral aspect of the femur  just distal to the vastus ridge. The leg was  externally rotated and capsule  was stripped off the inferior aspect of the femoral neck down to the  level of the lesser trochanter, this was done with electrocautery. The femur was lifted after this was performed. The  leg was then placed and extended in adducted position to essentially delivering the femur. We also removed the capsule superiorly and the  piriformis from the piriformis fossa  to gain excellent exposure of the  proximal femur. Rongeur was used to remove some cancellous bone to get  into the lateral portion of the proximal femur for placement of the  initial starter reamer. The starter broaches was placed  the starter broach  and was shown to go down the center of the canal. Broaching  with the  Corail system was then performed starting at size 8, coursing  Up to size 11. A size 11 had excellent torsional and rotational  and axial stability. The trial high offset neck was then placed  with a 32 + 1 trial  head. The hip was then reduced. We confirmed that  the stem was in the canal both on AP and lateral x-rays. It also has excellent sizing. The hip was reduced with outstanding stability through full extension, full external rotation,  and then flexion in adduction internal rotation. AP pelvis was taken  and the leg lengths were measured and found to be exactly equal. Hip  was then dislocated again and the femoral head and neck removed. The  femoral broach was removed. Size 11 Corail stem with a high offset  neck was then impacted into the femur following native anteversion. Has  excellent purchase in the canal. Excellent torsional and rotational and  axial stability. It is confirmed to be in the canal on AP and lateral  fluoroscopic views. The 32 + 1  metal head was placed and the hip  reduced with outstanding stability. Again AP pelvis was taken and it  confirmed that the leg lengths were equal. The wound was then copiously  irrigated with saline solution and the capsule reattached and repaired  with Ethibond suture.  20 mL of Exparel mixed with 50 mL of saline then additional 20 ml of .25% Bupivicaine injected into the capsule and into the edge of the tensor fascia lata as well as subcutaneous tissue. The fascia overlying the tensor fascia lata was  then closed with a running #1 V-Loc. Subcu was closed with interrupted  2-0 Vicryl and subcuticular running 4-0 Monocryl. Incision was cleaned  and dried. Steri-Strips and a bulky sterile dressing applied. Hemovac  drain was hooked to suction and then he was awakened and transported to  recovery in stable condition.        Please note that a surgical assistant was a medical necessity for this procedure to perform it in a safe and expeditious manner. Assistant was necessary to provide appropriate retraction of vital neurovascular structures and to prevent femoral fracture and allow for anatomic placement of the prosthesis.  Ollen Gross, M.D.

## 2013-10-11 NOTE — Anesthesia Postprocedure Evaluation (Signed)
  Anesthesia Post-op Note  Patient: Olivia Mullen  Procedure(s) Performed: Procedure(s) (LRB): RIGHT TOTAL HIP ARTHROPLASTY ANTERIOR APPROACH (Right)  Patient Location: PACU  Anesthesia Type: General  Level of Consciousness: awake and alert   Airway and Oxygen Therapy: Patient Spontanous Breathing  Post-op Pain: mild  Post-op Assessment: Post-op Vital signs reviewed, Patient's Cardiovascular Status Stable, Respiratory Function Stable, Patent Airway and No signs of Nausea or vomiting  Last Vitals:  Filed Vitals:   10/11/13 2009  BP: 153/55  Pulse: 81  Temp: 36.9 C  Resp: 14    Post-op Vital Signs: stable   Complications: No apparent anesthesia complications

## 2013-10-11 NOTE — Interval H&P Note (Signed)
History and Physical Interval Note:  10/11/2013 2:21 PM  Olivia Mullen  has presented today for surgery, with the diagnosis of OA RIGHT HIP   The various methods of treatment have been discussed with the patient and family. After consideration of risks, benefits and other options for treatment, the patient has consented to  Procedure(s): RIGHT TOTAL HIP ARTHROPLASTY ANTERIOR APPROACH (Right) as a surgical intervention .  The patient's history has been reviewed, patient examined, no change in status, stable for surgery.  I have reviewed the patient's chart and labs.  Questions were answered to the patient's satisfaction.     Loanne Drilling

## 2013-10-11 NOTE — Transfer of Care (Signed)
Immediate Anesthesia Transfer of Care Note  Patient: Olivia Mullen  Procedure(s) Performed: Procedure(s) (LRB): RIGHT TOTAL HIP ARTHROPLASTY ANTERIOR APPROACH (Right)  Patient Location: PACU  Anesthesia Type: General  Level of Consciousness: sedated, patient cooperative and responds to stimulation  Airway & Oxygen Therapy: Patient Spontanous Breathing and Patient connected to face mask oxgen  Post-op Assessment: Report given to PACU RN and Post -op Vital signs reviewed and stable  Post vital signs: Reviewed and stable  Complications: No apparent anesthesia complications

## 2013-10-12 ENCOUNTER — Encounter (HOSPITAL_COMMUNITY): Payer: Self-pay | Admitting: Orthopedic Surgery

## 2013-10-12 DIAGNOSIS — D62 Acute posthemorrhagic anemia: Secondary | ICD-10-CM | POA: Diagnosis not present

## 2013-10-12 LAB — CBC
HCT: 27.8 % — ABNORMAL LOW (ref 36.0–46.0)
Hemoglobin: 9.2 g/dL — ABNORMAL LOW (ref 12.0–15.0)
MCH: 29.2 pg (ref 26.0–34.0)
MCHC: 33.1 g/dL (ref 30.0–36.0)
MCV: 88.3 fL (ref 78.0–100.0)
Platelets: 169 10*3/uL (ref 150–400)
RBC: 3.15 MIL/uL — ABNORMAL LOW (ref 3.87–5.11)
RDW: 14.9 % (ref 11.5–15.5)
WBC: 7.4 10*3/uL (ref 4.0–10.5)

## 2013-10-12 LAB — BASIC METABOLIC PANEL
Calcium: 8.1 mg/dL — ABNORMAL LOW (ref 8.4–10.5)
Creatinine, Ser: 0.63 mg/dL (ref 0.50–1.10)
GFR calc Af Amer: >90 mL/min (ref >90)
GFR calc non Af Amer: 86 mL/min — ABNORMAL LOW (ref >90)
Glucose, Bld: 200 mg/dL — ABNORMAL HIGH (ref 70–99)
Sodium: 135 mEq/L (ref 135–145)

## 2013-10-12 MED ORDER — SODIUM CHLORIDE 0.9 % IV BOLUS (SEPSIS)
250.0000 mL | Freq: Once | INTRAVENOUS | Status: AC
  Administered 2013-10-12: 250 mL via INTRAVENOUS

## 2013-10-12 MED ORDER — POLYSACCHARIDE IRON COMPLEX 150 MG PO CAPS
150.0000 mg | ORAL_CAPSULE | Freq: Every day | ORAL | Status: DC
  Administered 2013-10-12 – 2013-10-13 (×2): 150 mg via ORAL
  Filled 2013-10-12 (×2): qty 1

## 2013-10-12 NOTE — Progress Notes (Signed)
Utilization review completed.  

## 2013-10-12 NOTE — Progress Notes (Signed)
Inpatient Diabetes Program Recommendations  AACE/ADA: New Consensus Statement on Inpatient Glycemic Control (2013)  Target Ranges:  Prepandial:   less than 140 mg/dL      Peak postprandial:   less than 180 mg/dL (1-2 hours)      Critically ill patients:  140 - 180 mg/dL   Reason for Visit: Hyperglycemia  Hx NIDDM. Hyperglycemia with steroids.  Results for Olivia Mullen, Olivia Mullen (MRN [id number]) as of 10/12/2013 16:43  Ref. Range 10/12/2013 04:39  Glucose Latest Range: 70-99 mg/dL 409 (H)    Inpatient Diabetes Program Recommendations Correction (SSI): Add Novolog sensitive tidwc  Note: Will follow. Thank you. Ailene Ards, RD, LDN, CDE Inpatient Diabetes Coordinator [phone]

## 2013-10-12 NOTE — Progress Notes (Signed)
Physical Therapy Treatment Patient Details Name: Olivia Mullen MRN: [id number] DOB: 1936/07/08 Today's Date: 10/12/2013 Time: 4098-1191 PT Time Calculation (min): 48 min  PT Assessment / Plan / Recommendation  History of Present Illness Pt with R THA, direct anterior approach   PT Comments   Pt requires frequent cues for safe use of RW as she tends to walk away. Souse present and aware of safety concerns.  Follow Up Recommendations  Home health PT     Does the patient have the potential to tolerate intense rehabilitation     Barriers to Discharge        Equipment Recommendations  Rolling walker with 5" wheels;3in1 (PT)    Recommendations for Other Services    Frequency 7X/week   Progress towards PT Goals Progress towards PT goals: Progressing toward goals  Plan Current plan remains appropriate    Precautions / Restrictions Precautions Precautions: None Restrictions RLE Weight Bearing: Weight bearing as tolerated   Pertinent Vitals/Pain No real c/o    Mobility  Bed Mobility Bed Mobility: Sit to Supine Supine to Sit: 4: Min assist Sitting - Scoot to Edge of Bed: 4: Min assist Sit to Supine: 4: Min assist Details for Bed Mobility Assistance: support R leg onto bed, spouse instructed. Transfers Sit to Stand: 4: Min assist;With upper extremity assist;From chair/3-in-1 Stand to Sit: To chair/3-in-1;4: Min assist;To bed;With upper extremity assist Details for Transfer Assistance: cues for hand placement, safety, frequent safety sues to not walk away from RW, position on edge of bed for safe sitting. Ambulation/Gait Ambulation/Gait Assistance: 4: Min assist Ambulation Distance (Feet): 60 Feet Ambulation/Gait Assistance Details: cues for R leg - tends to externally rotate. Gait Pattern: Step-through pattern    Exercises    PT Diagnosis: Acute pain  PT Problem List: Decreased strength;Decreased range of motion;Decreased mobility;Decreased knowledge of use of  DME;Decreased safety awareness;Decreased knowledge of precautions;Pain PT Treatment Interventions: DME instruction;Gait training;Stair training;Functional mobility training;Therapeutic activities;Therapeutic exercise;Patient/family education   PT Goals (current goals can now be found in the care plan section) Acute Rehab PT Goals Patient Stated Goal: to go home  PT Goal Formulation: With patient/family Time For Goal Achievement: 10/15/13 Potential to Achieve Goals: Good  Visit Information  Last PT Received On: 10/12/13 Assistance Needed: +1 History of Present Illness: Pt with R THA, direct anterior approach    Subjective Data  Patient Stated Goal: to go home    Cognition  Cognition Arousal/Alertness: Awake/alert Behavior During Therapy: Impulsive Overall Cognitive Status: Within Functional Limits for tasks assessed    Balance  Balance Balance Assessed: Yes Dynamic Sitting Balance Dynamic Sitting - Balance Support: No upper extremity supported;Feet unsupported;During functional activity Dynamic Sitting - Level of Assistance: 5: Stand by assistance Dynamic Standing Balance Dynamic Standing - Balance Support: Right upper extremity supported;Left upper extremity supported;During functional activity;Bilateral upper extremity supported Dynamic Standing - Level of Assistance: 4: Min assist  End of Session PT - End of Session Equipment Utilized During Treatment: Gait belt Activity Tolerance: Patient tolerated treatment well Patient left: with call bell/phone within reach;with family/visitor present;in bed Nurse Communication: Mobility status   GP     Rada Hay 10/12/2013, 3:57 PM

## 2013-10-12 NOTE — Progress Notes (Addendum)
Subjective: 1 Day Post-Op Procedure(s) (LRB): RIGHT TOTAL HIP ARTHROPLASTY ANTERIOR APPROACH (Right) Patient reports pain as mild and moderate.   Patient seen in rounds with Dr. Lequita Halt. Family in room.  Doing better this morning. Patient is well, but has had some minor complaints of pain in the hip, requiring pain medications We will start therapy today.  Plan is to go Home after hospital stay.  Objective: Vital signs in last 24 hours: Temp:  [97.4 F (36.3 C)-98.4 F (36.9 C)] 97.5 F (36.4 C) (12/23 0553) Pulse Rate:  [65-98] 72 (12/23 0553) Resp:  [13-21] 16 (12/23 0736) BP: (119-194)/(36-153) 119/71 mmHg (12/23 0553) SpO2:  [95 %-100 %] 98 % (12/23 0736) Weight:  [66.679 kg (147 lb)] 66.679 kg (147 lb) (12/22 1755)  Intake/Output from previous day:  Intake/Output Summary (Last 24 hours) at 10/12/13 0802 Last data filed at 10/12/13 0600  Gross per 24 hour  Intake   1025 ml  Output    865 ml  Net    160 ml    Intake/Output this shift: UOP 275 since MN LOW output thru night but starting to pick up. Gentle fluids.  Has Lasix later this morning. Strici I&O's  Labs:  Recent Labs  10/12/13 0439  HGB 9.2*    Recent Labs  10/12/13 0439  WBC 7.4  RBC 3.15*  HCT 27.8*  PLT 169    Recent Labs  10/12/13 0439  NA 135  K 3.5  CL 104  CO2 22  BUN 10  CREATININE 0.63  GLUCOSE 200*  CALCIUM 8.1*   No results found for this basename: LABPT, INR,  in the last 72 hours  EXAM General - Patient is Alert, Appropriate and Oriented Extremity - Neurovascular intact Sensation intact distally Dorsiflexion/Plantar flexion intact Dressing - dressing C/D/I Motor Function - intact, moving foot and toes well on exam.  Hemovac pulled without difficulty.  Past Medical History  Diagnosis Date  . Rheumatoid arthritis(714.0) 12/16/2011  . Multinodular goiter (nontoxic) 12/16/2011  . Hypothyroidism (acquired) 12/16/2011  . Normochromic normocytic anemia 12/16/2011  .  Anemia   . CHF (congestive heart failure)   . Psoriasis   . Depression   . UTI (lower urinary tract infection)   . Chronic systolic heart failure   . Anginal pain     long time ago  . Hypertension   . Hypercholesteremia     "doctor wants to get down to 100, now its 108"  . Anxiety   . Pneumonia     hx of, years ago  . Headache(784.0)     migraines  . History of kidney stones long time ago  . Cancer     right breast, 20 years ago  . Complication of anesthesia "long time ago"    one time woke up during surgery     Assessment/Plan: 1 Day Post-Op Procedure(s) (LRB): RIGHT TOTAL HIP ARTHROPLASTY ANTERIOR APPROACH (Right) Principal Problem:   OA (osteoarthritis) of hip Active Problems:   Hypothyroidism (acquired)   DM type 2 (diabetes mellitus, type 2)   Normochromic normocytic anemia   CHF (congestive heart failure)   Acute systolic CHF (congestive heart failure)   Hypertension   Chronic systolic heart failure   Osteoarthritis of right hip   Postoperative anemia due to acute blood loss  Estimated body mass index is 27.79 kg/(m^2) as calculated from the following:   Height as of this encounter: 5\' 1"  (1.549 m).   Weight as of this encounter: 66.679 kg (147 lb).  Advance diet Up with therapy Plan for discharge tomorrow Discharge home with home health  DVT Prophylaxis - Xarelto Weight Bearing As Tolerated right Leg Hemovac Pulled Begin Therapy Give Fluids this AM.  Low UOP last night. Strict I&O's  Elyjah Hazan 10/12/2013, 8:02 AM

## 2013-10-12 NOTE — Evaluation (Signed)
Physical Therapy Evaluation Patient Details Name: Olivia Mullen MRN: [id number] DOB: [birth year] Today's Date: 10/12/2013 Time: 4098-1191 PT Time Calculation (min): 39 min  PT Assessment / Plan / Recommendation History of Present Illness  Pt with R THA, anterior approach  Clinical Impression  Pt tolerated very well. Pt will benefit from PT to address problems. Pt plans DC to home tomorrow.Marland Kitchen    PT Assessment  Patient needs continued PT services    Follow Up Recommendations  Home health PT    Does the patient have the potential to tolerate intense rehabilitation      Barriers to Discharge        Equipment Recommendations  Rolling walker with 5" wheels;3in1 (PT)    Recommendations for Other Services     Frequency 7X/week    Precautions / Restrictions Precautions Precautions: None Restrictions RLE Weight Bearing: Weight bearing as tolerated   Pertinent Vitals/Pain R thigh is sore.      Mobility  Bed Mobility Bed Mobility: Supine to Sit;Sitting - Scoot to Edge of Bed Supine to Sit: 4: Min assist Sitting - Scoot to [place] of Bed: 4: Min assist Details for Bed Mobility Assistance: cues for technique and R leg support. Transfers Sit to Stand: 4: Min assist;With upper extremity assist;From bed Stand to Sit: To chair/3-in-1;4: Min assist Details for Transfer Assistance: cues for hand placement, safety Ambulation/Gait Ambulation/Gait Assistance: 4: Min assist Ambulation Distance (Feet): 60 Feet Ambulation/Gait Assistance Details: cues for sequence.  step length Gait Pattern: Step-to pattern    Exercises Total Joint Exercises Quad Sets: AROM;Both;10 reps;Supine Short Arc Quad: AROM;Right;10 reps;Supine Heel Slides: AAROM;Right;10 reps;Supine Hip ABduction/ADduction: AAROM;Right;10 reps;Supine   PT Diagnosis: Acute pain  PT Problem List: Decreased strength;Decreased range of motion;Decreased mobility;Decreased knowledge of use of DME;Decreased safety  awareness;Decreased knowledge of precautions;Pain PT Treatment Interventions: DME instruction;Gait training;Stair training;Functional mobility training;Therapeutic activities;Therapeutic exercise;Patient/family education     PT Goals(Current goals can be found in the care plan section) Acute Rehab PT Goals Patient Stated Goal: to go home  PT Goal Formulation: With patient/family Time For Goal Achievement: 10/15/13 Potential to Achieve Goals: Good  Visit Information  Last PT Received On: 10/12/13 Assistance Needed: +1 History of Present Illness: Pt with R THA, anterior approach       Prior Functioning  Home Living Family/patient expects to be discharged to:: Private residence Living Arrangements: Spouse/significant other Available Help at Discharge: Family Type of Home: House Home Access: Stairs to enter Secretary/administrator of Steps: 3 Entrance Stairs-Rails: Right;Left;Can reach both Home Layout: One level Home Equipment: Environmental consultant - 2 wheels;Cane - quad Prior Function Level of Independence: Independent with assistive device(s) Communication Communication: No difficulties Dominant Hand: Right    Cognition  Cognition Arousal/Alertness: Awake/alert Behavior During Therapy: Impulsive Overall Cognitive Status: Within Functional Limits for tasks assessed    Extremity/Trunk Assessment Upper Extremity Assessment Upper Extremity Assessment: Overall WFL for tasks assessed Lower Extremity Assessment Lower Extremity Assessment: RLE deficits/detail RLE Deficits / Details: able to advance during ambulation Cervical / Trunk Assessment Cervical / Trunk Assessment: Normal   Balance Balance Balance Assessed: Yes Dynamic Sitting Balance Dynamic Sitting - Balance Support: No upper extremity supported;Feet unsupported;During functional activity Dynamic Sitting - Level of Assistance: 5: Stand by assistance Dynamic Standing Balance Dynamic Standing - Balance Support: Right upper  extremity supported;Left upper extremity supported;During functional activity;Bilateral upper extremity supported Dynamic Standing - Level of Assistance: 4: Min assist  End of Session PT - End of Session Equipment Utilized During Treatment: Gait belt  Activity Tolerance: Patient tolerated treatment well Patient left: in chair;with call bell/phone within reach;with family/visitor present Nurse Communication: Mobility status  GP     Rada Hay 10/12/2013, 3:45 PM  Blanchard Kelch PT [phone]

## 2013-10-12 NOTE — Evaluation (Signed)
Occupational Therapy Evaluation Patient Details Name: Olivia Mullen MRN: [id number] DOB: May 02, 1936 Today's Date: 10/12/2013 Time: 1478-2956 OT Time Calculation (min): 26 min  OT Assessment / Plan / Recommendation History of present illness Pt with R THA, anterior approach   Clinical Impression   Pt demos decline in function with ADLs and ADL mobility safety and would benefit from acute OT services to address impairments to help restore PLOF to return home safely    OT Assessment  Patient needs continued OT Services    Follow Up Recommendations  Home health OT;Supervision/Assistance - 24 hour    Barriers to Discharge   none  Equipment Recommendations  Tub/shower seat    Recommendations for Other Services    Frequency  Min 2X/week    Precautions / Restrictions Precautions Precautions: Anterior Hip Restrictions Weight Bearing Restrictions: Yes RLE Weight Bearing: Weight bearing as tolerated   Pertinent Vitals/Pain 3/10 R LE    ADL  Grooming: Performed;Wash/dry hands;Wash/dry face;Min guard;Minimal assistance Where Assessed - Grooming: Supported standing Upper Body Bathing: Simulated;Supervision/safety;Set up Lower Body Bathing: Simulated;Moderate assistance Upper Body Dressing: Performed;Supervision/safety;Set up Lower Body Dressing: Performed;Moderate assistance;Maximal assistance Toilet Transfer: Performed;Minimal assistance Toilet Transfer Method: Sit to stand Toilet Transfer Equipment: Regular height toilet;Raised toilet seat with arms (or 3-in-1 over toilet);Grab bars Toileting - Clothing Manipulation and Hygiene: Performed;Moderate assistance Where Assessed - Toileting Clothing Manipulation and Hygiene: Standing Tub/Shower Transfer Method: Not assessed Equipment Used: Gait belt;Rolling walker;Other (comment) (3 in 1) Transfers/Ambulation Related to ADLs: cues fro correct hand placement, safety    OT Diagnosis: Acute pain  OT Problem List: Decreased knowledge  of use of DME or AE;Decreased activity tolerance;Impaired balance (sitting and/or standing);Pain OT Treatment Interventions: Self-care/ADL training;Therapeutic exercise;Patient/family education;Neuromuscular education;Balance training;Therapeutic activities;DME and/or AE instruction   OT Goals(Current goals can be found in the care plan section) Acute Rehab OT Goals Patient Stated Goal: to go home  OT Goal Formulation: With patient/family Time For Goal Achievement: 10/19/13 Potential to Achieve Goals: Good ADL Goals Pt Will Perform Grooming: with min guard assist;with supervision;with set-up;standing Pt Will Perform Lower Body Bathing: with min assist;with caregiver independent in assisting;with adaptive equipment Pt Will Perform Lower Body Dressing: with mod assist;with min assist;with caregiver independent in assisting;with adaptive equipment Pt Will Transfer to Toilet: with min guard assist;with supervision;ambulating;regular height toilet;grab bars (3 in 1 over toilet) Pt Will Perform Toileting - Clothing Manipulation and hygiene: with min assist;sit to/from stand Pt Will Perform Tub/Shower Transfer: with min assist;with min guard assist;3 in 1;tub bench;grab bars  Visit Information  Last OT Received On: 10/12/13 Assistance Needed: +1 History of Present Illness: Pt with R THA, anterior approach       Prior Functioning     Home Living Family/patient expects to be discharged to:: Private residence Living Arrangements: Spouse/significant other Available Help at Discharge: Family Type of Home: House Home Access: Stairs to enter Secretary/administrator of Steps: 3 Entrance Stairs-Rails: Right;Left;Can reach both Home Layout: One level Home Equipment: Environmental consultant - 2 wheels;Cane - quad Prior Function Level of Independence: Independent with assistive device(s) Communication Communication: No difficulties Dominant Hand: Right         Vision/Perception Vision - History Baseline  Vision: Wears glasses only for reading Patient Visual Report: No change from baseline Perception Perception: Within Functional Limits   Cognition  Cognition Arousal/Alertness: Awake/alert Overall Cognitive Status: Within Functional Limits for tasks assessed    Extremity/Trunk Assessment Upper Extremity Assessment Upper Extremity Assessment: Overall WFL for tasks assessed Lower Extremity Assessment Lower Extremity  Assessment: Defer to PT evaluation Cervical / Trunk Assessment Cervical / Trunk Assessment: Normal     Mobility Bed Mobility Bed Mobility: Not assessed Details for Bed Mobility Assistance: pt up in recliner Transfers Transfers: Sit to Stand;Stand to Sit Sit to Stand: 4: Min assist;With upper extremity assist;From chair/3-in-1;From toilet Stand to Sit: With upper extremity assist;4: Min guard;To chair/3-in-1;To toilet Details for Transfer Assistance: cues for hand placement, safety     Exercise     Balance Balance Balance Assessed: Yes Dynamic Sitting Balance Dynamic Sitting - Balance Support: No upper extremity supported;Feet unsupported;During functional activity Dynamic Sitting - Level of Assistance: 5: Stand by assistance Dynamic Standing Balance Dynamic Standing - Balance Support: Right upper extremity supported;Left upper extremity supported;During functional activity;Bilateral upper extremity supported Dynamic Standing - Level of Assistance: 4: Min assist   End of Session OT - End of Session Equipment Utilized During Treatment: Gait belt;Rolling walker;Other (comment) (3 in 1) Activity Tolerance: Patient tolerated treatment well Patient left: in chair;with call bell/phone within reach;with family/visitor present  GO     Galen Manila 10/12/2013, 1:10 PM

## 2013-10-12 NOTE — Progress Notes (Signed)
Notified Annice Pih PA regarding patient's total urinary output of 

## 2013-10-13 LAB — CBC
HCT: 26.2 % — ABNORMAL LOW (ref 36.0–46.0)
Hemoglobin: 8.6 g/dL — ABNORMAL LOW (ref 12.0–15.0)
MCH: 29.1 pg (ref 26.0–34.0)
MCHC: 32.8 g/dL (ref 30.0–36.0)
MCV: 88.5 fL (ref 78.0–100.0)
Platelets: 188 10*3/uL (ref 150–400)
RDW: 15 % (ref 11.5–15.5)
WBC: 14.6 10*3/uL — ABNORMAL HIGH (ref 4.0–10.5)

## 2013-10-13 LAB — BASIC METABOLIC PANEL
Calcium: 8.7 mg/dL (ref 8.4–10.5)
Creatinine, Ser: 0.74 mg/dL (ref 0.50–1.10)
GFR calc Af Amer: >90 mL/min (ref >90)
GFR calc non Af Amer: 81 mL/min — ABNORMAL LOW (ref >90)
Glucose, Bld: 146 mg/dL — ABNORMAL HIGH (ref 70–99)

## 2013-10-13 MED ORDER — METHOCARBAMOL 500 MG PO TABS: 500.0000 mg | ORAL_TABLET | Freq: Four times a day (QID) | ORAL | Status: DC | PRN

## 2013-10-13 MED ORDER — OXYCODONE HCL 5 MG PO TABS: 5.0000 mg | ORAL_TABLET | ORAL | Status: DC | PRN

## 2013-10-13 MED ORDER — RIVAROXABAN 10 MG PO TABS: 10.0000 mg | ORAL_TABLET | Freq: Every day | ORAL | Status: DC

## 2013-10-13 MED ORDER — POLYSACCHARIDE IRON COMPLEX 150 MG PO CAPS: 150.0000 mg | ORAL_CAPSULE | Freq: Every day | ORAL | Status: DC

## 2013-10-13 NOTE — Progress Notes (Signed)
Occupational Therapy Treatment Patient Details Name: Olivia Mullen MRN: [id number] DOB: 1936-01-06 Today's Date: 10/13/2013 Time: 2952-8413 OT Time Calculation (min): 14 min  OT Assessment / Plan / Recommendation                       Progress towards OT Goals Progress towards OT goals: Progressing toward goals  Plan Discharge plan remains appropriate    Precautions / Restrictions Restrictions Weight Bearing Restrictions: No RLE Weight Bearing: Weight bearing as tolerated       ADL  Toilet Transfer: Min guard Toilet Transfer Method: Sit to Barista: Comfort height toilet Toileting - Clothing Manipulation and Hygiene: Min guard Where Assessed - Toileting Clothing Manipulation and Hygiene: Standing Transfers/Ambulation Related to ADLs: Discussed tub transfer.  Unsure which seat pt has if it is a bench or a seat.  Reccomend HH therapist assess bathroom and A with tub transfer      OT Goals(current goals can now be found in the care plan section) Acute Rehab OT Goals Patient Stated Goal: wear high heels and dance with my husband  Visit Information  Last OT Received On: 10/13/13 Assistance Needed: +1          Cognition  Cognition Arousal/Alertness: Awake/alert Behavior During Therapy: Impulsive Overall Cognitive Status: Within Functional Limits for tasks assessed    Mobility  Bed Mobility Bed Mobility: Sit to Supine Supine to Sit: 5: Supervision Sit to Supine: 4: Min assist;5: Supervision Details for Bed Mobility Assistance: support R leg onto bed, spouse instructed. Transfers Transfers: Sit to Stand;Stand to Sit Sit to Stand: With upper extremity assist;From chair/3-in-1;5: Supervision Stand to Sit: To chair/3-in-1;To bed;With upper extremity assist;5: Supervision Details for Transfer Assistance: cues for hand placement, safety, frequent safety sues to not walk away from RW, position on edge of bed for safe sitting.          End of  Session OT - End of Session Activity Tolerance: Patient tolerated treatment well Patient left: in bed Nurse Communication: Mobility status  GO     Alba Cory 10/13/2013, 9:34 AM

## 2013-10-13 NOTE — Care Management Note (Signed)
    Page 1 of 2   10/13/2013     10:53:30 AM   CARE MANAGEMENT NOTE 10/13/2013  Patient:  Olivia Mullen, Olivia Mullen   Account Number:  [id number]  Date Initiated:  10/13/2013  Documentation initiated by:  Lorenda Ishihara  Subjective/Objective Assessment:   77 yo female admitted s/p Right total hip arthroplasty, anterior approach. PTA lived at home with spouse.     Action/Plan:   Home when stable   Anticipated DC Date:  10/13/2013   Anticipated DC Plan:  HOME W HOME HEALTH SERVICES      DC Planning Services  CM consult      [facility] Choice  HOME HEALTH   Choice offered to / List presented to:  C-1 Patient   DME arranged  3-N-1  Levan Hurst      DME agency  [facility]     HH arranged  HH-2 PT      [facility] agency  [facility]   Status of service:  Completed, signed off Medicare Important Message given?   (If response is "NO", the following Medicare IM given date fields will be blank) Date Medicare IM given:   Date Additional Medicare IM given:    Discharge Disposition:  HOME W HOME HEALTH SERVICES  Per UR Regulation:  Reviewed for med. necessity/level of care/duration of stay  If discussed at Long Length of Stay Meetings, dates discussed:    Comments:

## 2013-10-13 NOTE — Progress Notes (Signed)
Physical Therapy Treatment Patient Details Name: CHARINA FONS MRN: [id number] DOB: 1936-08-22 Today's Date: 10/13/2013 Time: 4098-1191 PT Time Calculation (min): 33 min  PT Assessment / Plan / Recommendation  History of Present Illness     PT Comments   Pt improving well. Family present for safety /guarding during mobility. Ready for Dc after receives DME.  Follow Up Recommendations  Home health PT     Does the patient have the potential to tolerate intense rehabilitation     Barriers to Discharge        Equipment Recommendations       Recommendations for Other Services    Frequency     Progress towards PT Goals Progress towards PT goals: Progressing toward goals  Plan Current plan remains appropriate    Precautions / Restrictions Precautions Precautions: None Restrictions Weight Bearing Restrictions: No RLE Weight Bearing: Weight bearing as tolerated   Pertinent Vitals/Pain none    Mobility  Bed Mobility Bed Mobility: Sit to Supine Supine to Sit: 4: Min assist;4: Min guard Sitting - Scoot to Edge of Bed: 5: Supervision Sit to Supine: 4: Min assist Details for Bed Mobility Assistance: support R leg onto bed, spouse instructed and assisted pt. Transfers Sit to Stand: With upper extremity assist;5: Supervision;From bed Stand to Sit: To bed;With upper extremity assist;5: Supervision Details for Transfer Assistance: spouse and son instructed. Ambulation/Gait Ambulation/Gait Assistance: 4: Min guard Ambulation Distance (Feet): 100 Feet Assistive device: Rolling walker Ambulation/Gait Assistance Details: cues for sequence. Spouse present for safe guarding instructions. Gait Pattern: Step-through pattern    Exercises Total Joint Exercises Quad Sets: AROM;Both;10 reps;Supine Short Arc Quad: AROM;Right;10 reps;Supine Heel Slides: Right;10 reps;Supine;AROM Hip ABduction/ADduction: Right;10 reps;Supine;AROM Long Arc Quad: AROM;Right   PT Diagnosis:    PT Problem  List:   PT Treatment Interventions:     PT Goals (current goals can now be found in the care plan section) Acute Rehab PT Goals Patient Stated Goal: wear high heels and dance with my husband  Visit Information  Last PT Received On: 10/13/13 Assistance Needed: +1    Subjective Data  Patient Stated Goal: wear high heels and dance with my husband   Cognition  Cognition Arousal/Alertness: Awake/alert Behavior During Therapy: Impulsive Overall Cognitive Status: Within Functional Limits for tasks assessed    Balance     End of Session PT - End of Session Activity Tolerance: Patient tolerated treatment well Patient left: with call bell/phone within reach;with family/visitor present;in bed Nurse Communication: Mobility status   GP     Rada Hay 10/13/2013, 12:12 PM

## 2013-10-13 NOTE — Progress Notes (Signed)
Subjective: 2 Days Post-Op Procedure(s) (LRB): RIGHT TOTAL HIP ARTHROPLASTY ANTERIOR APPROACH (Right) Patient reports pain as mild.   Patient seen in rounds with Dr. Lequita Halt.  Family in room. Patient is well, and has had no acute complaints or problems Patient is ready to go home   Objective: Vital signs in last 24 hours: Temp:  [97.8 F (36.6 C)-99.2 F (37.3 C)] 98.4 F (36.9 C) (12/24 0604) Pulse Rate:  [66-83] 66 (12/24 0604) Resp:  [14-16] 14 (12/24 0604) BP: (108-156)/(51-68) 150/68 mmHg (12/24 0604) SpO2:  [95 %-96 %] 95 % (12/24 0604)  Intake/Output from previous day:  Intake/Output Summary (Last 24 hours) at 10/13/13 0749 Last data filed at 10/12/13 2130  Gross per 24 hour  Intake    720 ml  Output   1775 ml  Net  -1055 ml    Intake/Output this shift:    Labs:  Recent Labs  10/12/13 0439 10/13/13 0522  HGB 9.2* 8.6*    Recent Labs  10/12/13 0439 10/13/13 0522  WBC 7.4 14.6*  RBC 3.15* 2.96*  HCT 27.8* 26.2*  PLT 169 188    Recent Labs  10/12/13 0439 10/13/13 0522  NA 135 136  K 3.5 4.4  CL 104 106  CO2 22 22  BUN 10 11  CREATININE 0.63 0.74  GLUCOSE 200* 146*  CALCIUM 8.1* 8.7   No results found for this basename: LABPT, INR,  in the last 72 hours  EXAM: General - Patient is Alert, Appropriate and Oriented Extremity - Neurovascular intact Sensation intact distally Incision - clean, dry, no drainage, healing Motor Function - intact, moving foot and toes well on exam.   Assessment/Plan: 2 Days Post-Op Procedure(s) (LRB): RIGHT TOTAL HIP ARTHROPLASTY ANTERIOR APPROACH (Right) Procedure(s) (LRB): RIGHT TOTAL HIP ARTHROPLASTY ANTERIOR APPROACH (Right) Past Medical History  Diagnosis Date  . Rheumatoid arthritis(714.0) 12/16/2011  . Multinodular goiter (nontoxic) 12/16/2011  . Hypothyroidism (acquired) 12/16/2011  . Normochromic normocytic anemia 12/16/2011  . Anemia   . CHF (congestive heart failure)   . Psoriasis   .  Depression   . UTI (lower urinary tract infection)   . Chronic systolic heart failure   . Anginal pain     long time ago  . Hypertension   . Hypercholesteremia     "doctor wants to get down to 100, now its 108"  . Anxiety   . Pneumonia     hx of, years ago  . Headache(784.0)     migraines  . History of kidney stones long time ago  . Cancer     right breast, 20 years ago  . Complication of anesthesia "long time ago"    one time woke up during surgery    Principal Problem:   OA (osteoarthritis) of hip Active Problems:   Hypothyroidism (acquired)   DM type 2 (diabetes mellitus, type 2)   Normochromic normocytic anemia   CHF (congestive heart failure)   Acute systolic CHF (congestive heart failure)   Hypertension   Chronic systolic heart failure   Osteoarthritis of right hip   Postoperative anemia due to acute blood loss  Estimated body mass index is 27.79 kg/(m^2) as calculated from the following:   Height as of this encounter: 5\' 1"  (1.549 m).   Weight as of this encounter: 66.679 kg (147 lb). Up with therapy Discharge home with home health Diet - Cardiac diet and Diabetic diet Follow up - in 2 weeks Activity - WBAT Disposition - Home Condition Upon Discharge -  Good D/C Meds - See DC Summary DVT Prophylaxis - Xarelto  Cordell Guercio 10/13/2013, 7:49 AM

## 2013-10-16 DIAGNOSIS — Z96649 Presence of unspecified artificial hip joint: Secondary | ICD-10-CM | POA: Diagnosis not present

## 2013-10-16 DIAGNOSIS — E119 Type 2 diabetes mellitus without complications: Secondary | ICD-10-CM | POA: Diagnosis not present

## 2013-10-16 DIAGNOSIS — Z471 Aftercare following joint replacement surgery: Secondary | ICD-10-CM | POA: Diagnosis not present

## 2013-10-16 DIAGNOSIS — I10 Essential (primary) hypertension: Secondary | ICD-10-CM | POA: Diagnosis not present

## 2013-10-16 DIAGNOSIS — IMO0001 Reserved for inherently not codable concepts without codable children: Secondary | ICD-10-CM | POA: Diagnosis not present

## 2013-10-18 DIAGNOSIS — E119 Type 2 diabetes mellitus without complications: Secondary | ICD-10-CM | POA: Diagnosis not present

## 2013-10-18 DIAGNOSIS — Z96649 Presence of unspecified artificial hip joint: Secondary | ICD-10-CM | POA: Diagnosis not present

## 2013-10-18 DIAGNOSIS — I10 Essential (primary) hypertension: Secondary | ICD-10-CM | POA: Diagnosis not present

## 2013-10-18 DIAGNOSIS — Z471 Aftercare following joint replacement surgery: Secondary | ICD-10-CM | POA: Diagnosis not present

## 2013-10-18 DIAGNOSIS — IMO0001 Reserved for inherently not codable concepts without codable children: Secondary | ICD-10-CM | POA: Diagnosis not present

## 2013-10-19 DIAGNOSIS — E119 Type 2 diabetes mellitus without complications: Secondary | ICD-10-CM | POA: Diagnosis not present

## 2013-10-19 DIAGNOSIS — Z471 Aftercare following joint replacement surgery: Secondary | ICD-10-CM | POA: Diagnosis not present

## 2013-10-19 DIAGNOSIS — IMO0001 Reserved for inherently not codable concepts without codable children: Secondary | ICD-10-CM | POA: Diagnosis not present

## 2013-10-19 DIAGNOSIS — I10 Essential (primary) hypertension: Secondary | ICD-10-CM | POA: Diagnosis not present

## 2013-10-19 DIAGNOSIS — Z96649 Presence of unspecified artificial hip joint: Secondary | ICD-10-CM | POA: Diagnosis not present

## 2013-10-22 DIAGNOSIS — E119 Type 2 diabetes mellitus without complications: Secondary | ICD-10-CM | POA: Diagnosis not present

## 2013-10-22 DIAGNOSIS — I10 Essential (primary) hypertension: Secondary | ICD-10-CM | POA: Diagnosis not present

## 2013-10-22 DIAGNOSIS — Z96649 Presence of unspecified artificial hip joint: Secondary | ICD-10-CM | POA: Diagnosis not present

## 2013-10-22 DIAGNOSIS — IMO0001 Reserved for inherently not codable concepts without codable children: Secondary | ICD-10-CM | POA: Diagnosis not present

## 2013-10-22 DIAGNOSIS — Z471 Aftercare following joint replacement surgery: Secondary | ICD-10-CM | POA: Diagnosis not present

## 2013-10-25 DIAGNOSIS — I10 Essential (primary) hypertension: Secondary | ICD-10-CM | POA: Diagnosis not present

## 2013-10-25 DIAGNOSIS — E119 Type 2 diabetes mellitus without complications: Secondary | ICD-10-CM | POA: Diagnosis not present

## 2013-10-25 DIAGNOSIS — Z471 Aftercare following joint replacement surgery: Secondary | ICD-10-CM | POA: Diagnosis not present

## 2013-10-25 DIAGNOSIS — Z96649 Presence of unspecified artificial hip joint: Secondary | ICD-10-CM | POA: Diagnosis not present

## 2013-10-25 DIAGNOSIS — IMO0001 Reserved for inherently not codable concepts without codable children: Secondary | ICD-10-CM | POA: Diagnosis not present

## 2013-10-27 DIAGNOSIS — Z96649 Presence of unspecified artificial hip joint: Secondary | ICD-10-CM | POA: Diagnosis not present

## 2013-10-27 DIAGNOSIS — E119 Type 2 diabetes mellitus without complications: Secondary | ICD-10-CM | POA: Diagnosis not present

## 2013-10-27 DIAGNOSIS — I10 Essential (primary) hypertension: Secondary | ICD-10-CM | POA: Diagnosis not present

## 2013-10-27 DIAGNOSIS — IMO0001 Reserved for inherently not codable concepts without codable children: Secondary | ICD-10-CM | POA: Diagnosis not present

## 2013-10-27 DIAGNOSIS — Z471 Aftercare following joint replacement surgery: Secondary | ICD-10-CM | POA: Diagnosis not present

## 2013-10-29 DIAGNOSIS — Z96649 Presence of unspecified artificial hip joint: Secondary | ICD-10-CM | POA: Diagnosis not present

## 2013-10-29 DIAGNOSIS — I10 Essential (primary) hypertension: Secondary | ICD-10-CM | POA: Diagnosis not present

## 2013-10-29 DIAGNOSIS — IMO0001 Reserved for inherently not codable concepts without codable children: Secondary | ICD-10-CM | POA: Diagnosis not present

## 2013-10-29 DIAGNOSIS — Z471 Aftercare following joint replacement surgery: Secondary | ICD-10-CM | POA: Diagnosis not present

## 2013-10-29 DIAGNOSIS — E119 Type 2 diabetes mellitus without complications: Secondary | ICD-10-CM | POA: Diagnosis not present

## 2013-10-31 DIAGNOSIS — J019 Acute sinusitis, unspecified: Secondary | ICD-10-CM | POA: Diagnosis not present

## 2013-11-02 NOTE — Discharge Summary (Signed)
Physician Discharge Summary   Patient ID: Olivia Mullen MRN: [id number] DOB/AGE: 06-19-1936 78 y.o.  Admit date: 10/11/2013 Discharge date: 10/13/2013  Primary Diagnosis:  Osteoarthritis of the Right hip.   Admission Diagnoses:  Past Medical History  Diagnosis Date  . Rheumatoid arthritis(714.0) 12/16/2011  . Multinodular goiter (nontoxic) 12/16/2011  . Hypothyroidism (acquired) 12/16/2011  . Normochromic normocytic anemia 12/16/2011  . Anemia   . CHF (congestive heart failure)   . Psoriasis   . Depression   . UTI (lower urinary tract infection)   . Chronic systolic heart failure   . Anginal pain     long time ago  . Hypertension   . Hypercholesteremia     "doctor wants to get down to 100, now its 108"  . Anxiety   . Pneumonia     hx of, years ago  . Headache(784.0)     migraines  . History of kidney stones long time ago  . Cancer     right breast, 20 years ago  . Complication of anesthesia "long time ago"    one time woke up during surgery    Discharge Diagnoses:   Principal Problem:   OA (osteoarthritis) of hip Active Problems:   Hypothyroidism (acquired)   DM type 2 (diabetes mellitus, type 2)   Normochromic normocytic anemia   CHF (congestive heart failure)   Acute systolic CHF (congestive heart failure)   Hypertension   Chronic systolic heart failure   Osteoarthritis of right hip   Postoperative anemia due to acute blood loss  Estimated body mass index is 27.79 kg/(m^2) as calculated from the following:   Height as of this encounter: 5' 1" (1.549 m).   Weight as of this encounter: 66.679 kg (147 lb).  Procedure(s) (LRB): RIGHT TOTAL HIP ARTHROPLASTY ANTERIOR APPROACH (Right)   Consults: None  HPI: Olivia Mullen is a 78 y.o. female who has advanced end-  stage arthritis of his Right hip with progressively worsening pain and  dysfunction.The patient has failed nonoperative management and presents for  total hip arthroplasty.   Laboratory  Data: Admission on 10/11/2013, Discharged on 10/13/2013  Component Date Value Range Status  . Glucose-Capillary 10/11/2013 79  70 - 99 mg/dL Final  . Comment 1 10/11/2013 Documented in Chart   Final  . Comment 2 10/11/2013 Notify RN   Final  . WBC 10/12/2013 7.4  4.0 - 10.5 K/uL Final  . RBC 10/12/2013 3.15* 3.87 - 5.11 MIL/uL Final  . Hemoglobin 10/12/2013 9.2* 12.0 - 15.0 g/dL Final  . HCT 10/12/2013 27.8* 36.0 - 46.0 % Final  . MCV 10/12/2013 88.3  78.0 - 100.0 fL Final  . MCH 10/12/2013 29.2  26.0 - 34.0 pg Final  . MCHC 10/12/2013 33.1  30.0 - 36.0 g/dL Final  . RDW 10/12/2013 14.9  11.5 - 15.5 % Final  . Platelets 10/12/2013 169  150 - 400 K/uL Final  . Sodium 10/12/2013 135  135 - 145 mEq/L Final  . Potassium 10/12/2013 3.5  3.5 - 5.1 mEq/L Final  . Chloride 10/12/2013 104  96 - 112 mEq/L Final  . CO2 10/12/2013 22  19 - 32 mEq/L Final  . Glucose, Bld 10/12/2013 200* 70 - 99 mg/dL Final  . BUN 10/12/2013 10  6 - 23 mg/dL Final  . Creatinine, Ser 10/12/2013 0.63  0.50 - 1.10 mg/dL Final  . Calcium 10/12/2013 8.1* 8.4 - 10.5 mg/dL Final  . GFR calc non Af Amer 10/12/2013 86* >90 mL/min Final  .  GFR calc Af Amer 10/12/2013 >90  >90 mL/min Final   Comment: (NOTE)                          The eGFR has been calculated using the CKD EPI equation.                          This calculation has not been validated in all clinical situations.                          eGFR's persistently <90 mL/min signify possible Chronic Kidney                          Disease.  . WBC 10/13/2013 14.6* 4.0 - 10.5 K/uL Final  . RBC 10/13/2013 2.96* 3.87 - 5.11 MIL/uL Final  . Hemoglobin 10/13/2013 8.6* 12.0 - 15.0 g/dL Final  . HCT 10/13/2013 26.2* 36.0 - 46.0 % Final  . MCV 10/13/2013 88.5  78.0 - 100.0 fL Final  . MCH 10/13/2013 29.1  26.0 - 34.0 pg Final  . MCHC 10/13/2013 32.8  30.0 - 36.0 g/dL Final  . RDW 10/13/2013 15.0  11.5 - 15.5 % Final  . Platelets 10/13/2013 188  150 - 400 K/uL Final  .  Sodium 10/13/2013 136  135 - 145 mEq/L Final  . Potassium 10/13/2013 4.4  3.5 - 5.1 mEq/L Final   Comment: MODERATE HEMOLYSIS                          HEMOLYSIS AT THIS LEVEL MAY AFFECT RESULT                          DELTA CHECK NOTED  . Chloride 10/13/2013 106  96 - 112 mEq/L Final  . CO2 10/13/2013 22  19 - 32 mEq/L Final  . Glucose, Bld 10/13/2013 146* 70 - 99 mg/dL Final  . BUN 10/13/2013 11  6 - 23 mg/dL Final  . Creatinine, Ser 10/13/2013 0.74  0.50 - 1.10 mg/dL Final  . Calcium 10/13/2013 8.7  8.4 - 10.5 mg/dL Final  . GFR calc non Af Amer 10/13/2013 81* >90 mL/min Final  . GFR calc Af Amer 10/13/2013 >90  >90 mL/min Final   Comment: (NOTE)                          The eGFR has been calculated using the CKD EPI equation.                          This calculation has not been validated in all clinical situations.                          eGFR's persistently <90 mL/min signify possible Chronic Kidney                          Disease.  Hospital Outpatient Visit on 10/05/2013  Component Date Value Range Status  . MRSA, PCR 10/05/2013 NEGATIVE  NEGATIVE Final  . Staphylococcus aureus 10/05/2013 NEGATIVE  NEGATIVE Final   Comment:  The Xpert SA Assay (FDA                          approved for NASAL specimens                          in patients over 38 years of age),                          is one component of                          a comprehensive surveillance                          program.  Test performance has                          been validated by [employer] for patients greater                          than or equal to 44 year old.                          It is not intended                          to diagnose infection nor to                          guide or monitor treatment.  Marland Kitchen aPTT 10/05/2013 26  24 - 37 seconds Final  . WBC 10/05/2013 5.8  4.0 - 10.5 K/uL Final  . RBC 10/05/2013 3.95  3.87 - 5.11  MIL/uL Final  . Hemoglobin 10/05/2013 11.4* 12.0 - 15.0 g/dL Final  . HCT 10/05/2013 35.3* 36.0 - 46.0 % Final  . MCV 10/05/2013 89.4  78.0 - 100.0 fL Final  . MCH 10/05/2013 28.9  26.0 - 34.0 pg Final  . MCHC 10/05/2013 32.3  30.0 - 36.0 g/dL Final  . RDW 10/05/2013 14.9  11.5 - 15.5 % Final  . Platelets 10/05/2013 220  150 - 400 K/uL Final  . Sodium 10/05/2013 140  135 - 145 mEq/L Final  . Potassium 10/05/2013 3.9  3.5 - 5.1 mEq/L Final  . Chloride 10/05/2013 107  96 - 112 mEq/L Final  . CO2 10/05/2013 25  19 - 32 mEq/L Final  . Glucose, Bld 10/05/2013 96  70 - 99 mg/dL Final  . BUN 10/05/2013 9  6 - 23 mg/dL Final  . Creatinine, Ser 10/05/2013 0.69  0.50 - 1.10 mg/dL Final  . Calcium 10/05/2013 9.3  8.4 - 10.5 mg/dL Final  . Total Protein 10/05/2013 6.1  6.0 - 8.3 g/dL Final  . Albumin 10/05/2013 3.5  3.5 - 5.2 g/dL Final  . AST 10/05/2013 24  0 - 37 U/L Final  . ALT 10/05/2013 20  0 - 35 U/L Final  . Alkaline Phosphatase 10/05/2013 89  39 - 117 U/L Final  . Total Bilirubin 10/05/2013 0.3  0.3 - 1.2 mg/dL Final  .  GFR calc non Af Amer 10/05/2013 83* >90 mL/min Final  . GFR calc Af Amer 10/05/2013 >90  >90 mL/min Final   Comment: (NOTE)                          The eGFR has been calculated using the CKD EPI equation.                          This calculation has not been validated in all clinical situations.                          eGFR's persistently <90 mL/min signify possible Chronic Kidney                          Disease.  Marland Kitchen Prothrombin Time 10/05/2013 11.6  11.6 - 15.2 seconds Final  . INR 10/05/2013 0.86  0.00 - 1.49 Final  . Color, Urine 10/05/2013 YELLOW  YELLOW Final  . APPearance 10/05/2013 CLEAR  CLEAR Final  . Specific Gravity, Urine 10/05/2013 1.007  1.005 - 1.030 Final  . pH 10/05/2013 5.0  5.0 - 8.0 Final  . Glucose, UA 10/05/2013 NEGATIVE  NEGATIVE mg/dL Final  . Hgb urine dipstick 10/05/2013 NEGATIVE  NEGATIVE Final  . Bilirubin Urine 10/05/2013 NEGATIVE   NEGATIVE Final  . Ketones, ur 10/05/2013 NEGATIVE  NEGATIVE mg/dL Final  . Protein, ur 10/05/2013 NEGATIVE  NEGATIVE mg/dL Final  . Urobilinogen, UA 10/05/2013 0.2  0.0 - 1.0 mg/dL Final  . Nitrite 10/05/2013 NEGATIVE  NEGATIVE Final  . Leukocytes, UA 10/05/2013 NEGATIVE  NEGATIVE Final   MICROSCOPIC NOT DONE ON URINES WITH NEGATIVE PROTEIN, BLOOD, LEUKOCYTES, NITRITE, OR GLUCOSE <1000 mg/dL.  . ABO/RH(D) 10/05/2013 O POS   Final  . Antibody Screen 10/05/2013 NEG   Final  . Sample Expiration 10/05/2013 10/14/2013   Final  . ABO/RH(D) 10/05/2013 O POS   Final     X-Rays:Dg Chest 2 View  10/05/2013   CLINICAL DATA:  Preop for right hip surgery.  Hypertension.  EXAM: CHEST  2 VIEW  COMPARISON:  03/05/2012  FINDINGS: Cardiac silhouette is borderline enlarged. Aorta is mildly uncoiled. No mediastinal or hilar masses or adenopathy.  Clear lungs.  No pleural effusion or pneumothorax.  There are changes from right breast surgery, stable  Bony thorax is demineralized. Left shoulder prosthesis is well aligned. Surgical clips in the neck base suggest prior thyroid surgery, stable.  IMPRESSION: No acute cardiopulmonary disease.   Electronically Signed   By: Lajean Manes M.D.   On: 10/05/2013 14:33   Dg Hip Complete Right  10/05/2013   CLINICAL DATA:  Preop for right hip surgery.  EXAM: RIGHT HIP - COMPLETE 2+ VIEW  COMPARISON:  None.  FINDINGS: Advanced arthropathic changes of the right hip are reflected by complete loss of the joint space as well as subchondral acetabular and femoral head sclerosis and cystic change.  The left hip joint is normally space and aligned.  The bones are demineralized.  There is no fracture.  There are 2 surgical vascular clips superimposed over the medial right acetabulum. Vascular calcifications are noted along the aorta, iliac and right femoral vessels.  IMPRESSION: No fracture or acute finding.  Advanced arthropathic changes of the right hip.   Electronically Signed   By:  Lajean Manes M.D.   On: 10/05/2013 14:35   Dg  Pelvis Portable  10/11/2013   CLINICAL DATA:  Post right hip replacement  EXAM: PORTABLE PELVIS 1-2 VIEWS  COMPARISON:  Portable exam 1643 hr compared to 10/05/2013  FINDINGS: Interval right hip surgery with acetabular and femoral components of a right hip prosthesis identified in expected positions.  Surgical drain present.  Bones appear demineralized.  No acute fracture dislocation seen.  IMPRESSION: Right hip prosthesis without acute complication.   Electronically Signed   By: Lavonia Dana M.D.   On: 10/11/2013 16:52   Dg C-arm 1-60 Min-no Report  10/11/2013   CLINICAL DATA: Right hip replacement   C-ARM 1-60 MINUTES  Fluoroscopy was utilized by the requesting physician.  No radiographic  interpretation.     EKG: Orders placed in visit on 08/31/13  . EKG 12-LEAD     Hospital Course: Patient was admitted to [facility] and taken to the OR and underwent the above state procedure without complications.  Patient tolerated the procedure well and was later transferred to the recovery room and then to the orthopaedic floor for postoperative care.  They were given PO and IV analgesics for pain control following their surgery.  They were given 24 hours of postoperative antibiotics of  Anti-infectives   Start     Dose/Rate Route Frequency Ordered Stop   10/11/13 2100  ceFAZolin (ANCEF) IVPB 1 g/50 mL premix     1 g 100 mL/hr over 30 Minutes Intravenous Every 6 hours 10/11/13 1747 10/12/13 0323   10/11/13 1015  ceFAZolin (ANCEF) IVPB 2 g/50 mL premix     2 g 100 mL/hr over 30 Minutes Intravenous On call to O.R. 10/11/13 1012 10/11/13 1430     and started on DVT prophylaxis in the form of Xarelto.   PT and OT were ordered for total hip protocol.  The patient was allowed to be WBAT with therapy. Discharge planning was consulted to help with postop disposition and equipment needs.  Patient had a decent night on the evening of surgery but had low  UOP.  Given fluids and monitored the I&O's.  They started to get up OOB with therapy on day one.  Hemovac drain was pulled without difficulty.  Continued to work with therapy into day two and was doing better.  Dressing was changed on day two and the incision was healing well. Patient was seen in rounds and was ready to go home.  Discharge home with home health  Diet - Cardiac diet and Diabetic diet  Follow up - in 2 weeks  Activity - WBAT  Disposition - Home  Condition Upon Discharge - Good  D/C Meds - See DC Summary  DVT Prophylaxis - Xarelto    Future Appointments Provider Department Dept Phone   01/12/2014 3:30 PM Sinclair Grooms, MD [facility] [phone]       Medication List    STOP taking these medications       aspirin-acetaminophen-caffeine 250-250-65 MG per tablet  Commonly known as:  EXCEDRIN MIGRAINE     etanercept 25 MG injection  Commonly known as:  ENBREL     folic acid 1 MG tablet  Commonly known as:  FOLVITE     HYDROcodone-acetaminophen 10-325 MG per tablet  Commonly known as:  NORCO     leflunomide 20 MG tablet  Commonly known as:  ARAVA     multivitamin with minerals Tabs tablet     Vitamin D 2000 UNITS tablet      TAKE these medications  carvedilol 6.25 MG tablet  Commonly known as:  COREG  Take 6.25 mg by mouth 2 (two) times daily with a meal.     COZAAR 25 MG tablet  Generic drug:  losartan  Take 25 mg by mouth every morning.     doxepin 50 MG capsule  Commonly known as:  SINEQUAN  Take 50 mg by mouth at bedtime.     DULoxetine 60 MG capsule  Commonly known as:  CYMBALTA  Take 60 mg by mouth 2 (two) times daily.     furosemide 20 MG tablet  Commonly known as:  LASIX  Take 20 mg by mouth every morning.     iron polysaccharides 150 MG capsule  Commonly known as:  NIFEREX  Take 1 capsule (150 mg total) by mouth daily.     KLONOPIN 0.5 MG tablet  Generic drug:  clonazePAM  Take 0.5-1 mg by mouth 2 (two)  times daily. 1 tablet in the am and 2 tablets in the pm.     levothyroxine 88 MCG tablet  Commonly known as:  SYNTHROID, LEVOTHROID  Take 88 mcg by mouth daily before breakfast.     methocarbamol 500 MG tablet  Commonly known as:  ROBAXIN  Take 1 tablet (500 mg total) by mouth every 6 (six) hours as needed for muscle spasms.     oxyCODONE 5 MG immediate release tablet  Commonly known as:  Oxy IR/ROXICODONE  Take 1-2 tablets (5-10 mg total) by mouth every 3 (three) hours as needed for breakthrough pain.     potassium chloride SA 20 MEQ tablet  Commonly known as:  K-DUR,KLOR-CON  Take 20 mEq by mouth 2 (two) times daily.     predniSONE 1 MG tablet  Commonly known as:  DELTASONE  Take 1 mg by mouth daily with breakfast.     rivaroxaban 10 MG Tabs tablet  Commonly known as:  XARELTO  Take 1 tablet (10 mg total) by mouth daily with breakfast.     simvastatin 20 MG tablet  Commonly known as:  ZOCOR  Take 20 mg by mouth every evening.           Follow-up Information   Follow up with Gearlean Alf, MD On 10/26/2013. (Call [phone] Monday to make the appointmnet)    Specialty:  Orthopedic Surgery   Contact information:   [address] [place] [address] [phone]       Signed: Mickel Crow 11/02/2013, 8:57 AM

## 2013-11-08 ENCOUNTER — Other Ambulatory Visit: Payer: Self-pay | Admitting: Gastroenterology

## 2013-11-08 DIAGNOSIS — R634 Abnormal weight loss: Secondary | ICD-10-CM | POA: Diagnosis not present

## 2013-11-08 DIAGNOSIS — R197 Diarrhea, unspecified: Secondary | ICD-10-CM | POA: Diagnosis not present

## 2013-11-09 DIAGNOSIS — R197 Diarrhea, unspecified: Secondary | ICD-10-CM | POA: Diagnosis not present

## 2013-11-15 DIAGNOSIS — N3090 Cystitis, unspecified without hematuria: Secondary | ICD-10-CM | POA: Diagnosis not present

## 2013-11-16 DIAGNOSIS — M169 Osteoarthritis of hip, unspecified: Secondary | ICD-10-CM | POA: Diagnosis not present

## 2013-11-16 DIAGNOSIS — M1610 Unilateral primary osteoarthritis, unspecified hip: Secondary | ICD-10-CM | POA: Diagnosis not present

## 2013-11-16 DIAGNOSIS — Z96649 Presence of unspecified artificial hip joint: Secondary | ICD-10-CM | POA: Diagnosis not present

## 2013-11-17 ENCOUNTER — Other Ambulatory Visit: Payer: Self-pay | Admitting: Gastroenterology

## 2013-11-17 DIAGNOSIS — K621 Rectal polyp: Secondary | ICD-10-CM | POA: Diagnosis not present

## 2013-11-17 DIAGNOSIS — K5289 Other specified noninfective gastroenteritis and colitis: Secondary | ICD-10-CM | POA: Diagnosis not present

## 2013-11-17 DIAGNOSIS — R197 Diarrhea, unspecified: Secondary | ICD-10-CM | POA: Diagnosis not present

## 2013-11-17 DIAGNOSIS — D128 Benign neoplasm of rectum: Secondary | ICD-10-CM | POA: Diagnosis not present

## 2013-11-17 DIAGNOSIS — K620 Anal polyp: Secondary | ICD-10-CM | POA: Diagnosis not present

## 2013-11-22 ENCOUNTER — Other Ambulatory Visit: Payer: Medicare Other

## 2013-11-24 DIAGNOSIS — K5289 Other specified noninfective gastroenteritis and colitis: Secondary | ICD-10-CM | POA: Diagnosis not present

## 2013-11-24 DIAGNOSIS — IMO0001 Reserved for inherently not codable concepts without codable children: Secondary | ICD-10-CM | POA: Diagnosis not present

## 2013-11-24 DIAGNOSIS — M069 Rheumatoid arthritis, unspecified: Secondary | ICD-10-CM | POA: Diagnosis not present

## 2013-11-24 DIAGNOSIS — D126 Benign neoplasm of colon, unspecified: Secondary | ICD-10-CM | POA: Diagnosis not present

## 2013-11-30 DIAGNOSIS — E7800 Pure hypercholesterolemia, unspecified: Secondary | ICD-10-CM | POA: Diagnosis not present

## 2013-11-30 DIAGNOSIS — E039 Hypothyroidism, unspecified: Secondary | ICD-10-CM | POA: Diagnosis not present

## 2013-11-30 DIAGNOSIS — Z79899 Other long term (current) drug therapy: Secondary | ICD-10-CM | POA: Diagnosis not present

## 2013-12-17 ENCOUNTER — Other Ambulatory Visit: Payer: Self-pay

## 2013-12-17 MED ORDER — FUROSEMIDE 20 MG PO TABS: 20.0000 mg | ORAL_TABLET | Freq: Every morning | ORAL | Status: DC

## 2014-01-05 DIAGNOSIS — M069 Rheumatoid arthritis, unspecified: Secondary | ICD-10-CM | POA: Diagnosis not present

## 2014-01-05 DIAGNOSIS — K5289 Other specified noninfective gastroenteritis and colitis: Secondary | ICD-10-CM | POA: Diagnosis not present

## 2014-01-05 DIAGNOSIS — E119 Type 2 diabetes mellitus without complications: Secondary | ICD-10-CM | POA: Diagnosis not present

## 2014-01-05 DIAGNOSIS — IMO0001 Reserved for inherently not codable concepts without codable children: Secondary | ICD-10-CM | POA: Diagnosis not present

## 2014-01-12 ENCOUNTER — Ambulatory Visit: Payer: Medicare Other | Admitting: Interventional Cardiology

## 2014-01-20 DIAGNOSIS — F3342 Major depressive disorder, recurrent, in full remission: Secondary | ICD-10-CM | POA: Diagnosis not present

## 2014-01-25 ENCOUNTER — Other Ambulatory Visit: Payer: Self-pay | Admitting: *Deleted

## 2014-01-25 DIAGNOSIS — M545 Low back pain, unspecified: Secondary | ICD-10-CM | POA: Diagnosis not present

## 2014-01-25 DIAGNOSIS — M2550 Pain in unspecified joint: Secondary | ICD-10-CM | POA: Diagnosis not present

## 2014-01-25 DIAGNOSIS — Z79899 Other long term (current) drug therapy: Secondary | ICD-10-CM | POA: Diagnosis not present

## 2014-01-25 DIAGNOSIS — M069 Rheumatoid arthritis, unspecified: Secondary | ICD-10-CM | POA: Diagnosis not present

## 2014-01-25 MED ORDER — CARVEDILOL 6.25 MG PO TABS: 6.2500 mg | ORAL_TABLET | Freq: Two times a day (BID) | ORAL | Status: DC

## 2014-01-25 MED ORDER — POTASSIUM CHLORIDE CRYS ER 20 MEQ PO TBCR
20.0000 meq | EXTENDED_RELEASE_TABLET | Freq: Two times a day (BID) | ORAL | Status: DC
Start: 2014-01-25 — End: 2014-07-18

## 2014-02-01 DIAGNOSIS — F331 Major depressive disorder, recurrent, moderate: Secondary | ICD-10-CM | POA: Diagnosis not present

## 2014-02-01 DIAGNOSIS — N390 Urinary tract infection, site not specified: Secondary | ICD-10-CM | POA: Diagnosis not present

## 2014-02-01 DIAGNOSIS — R300 Dysuria: Secondary | ICD-10-CM | POA: Diagnosis not present

## 2014-02-01 DIAGNOSIS — E039 Hypothyroidism, unspecified: Secondary | ICD-10-CM | POA: Diagnosis not present

## 2014-02-09 DIAGNOSIS — N390 Urinary tract infection, site not specified: Secondary | ICD-10-CM | POA: Diagnosis not present

## 2014-02-15 ENCOUNTER — Ambulatory Visit: Payer: Medicare Other | Admitting: Interventional Cardiology

## 2014-02-15 DIAGNOSIS — F331 Major depressive disorder, recurrent, moderate: Secondary | ICD-10-CM | POA: Diagnosis not present

## 2014-02-16 ENCOUNTER — Encounter: Payer: Self-pay | Admitting: Interventional Cardiology

## 2014-02-16 ENCOUNTER — Ambulatory Visit (INDEPENDENT_AMBULATORY_CARE_PROVIDER_SITE_OTHER): Payer: Medicare Other | Admitting: Interventional Cardiology

## 2014-02-16 VITALS — BP 114/50 | HR 80 | Ht 61.0 in | Wt 133.0 lb

## 2014-02-16 DIAGNOSIS — I10 Essential (primary) hypertension: Secondary | ICD-10-CM | POA: Diagnosis not present

## 2014-02-16 DIAGNOSIS — I5022 Chronic systolic (congestive) heart failure: Secondary | ICD-10-CM

## 2014-02-16 DIAGNOSIS — E876 Hypokalemia: Secondary | ICD-10-CM | POA: Diagnosis not present

## 2014-02-16 DIAGNOSIS — R0602 Shortness of breath: Secondary | ICD-10-CM

## 2014-02-16 LAB — BASIC METABOLIC PANEL
BUN: 21 mg/dL (ref 6–23)
CO2: 23 meq/L (ref 19–32)
CREATININE: 1.1 mg/dL (ref 0.4–1.2)
Calcium: 8.7 mg/dL (ref 8.4–10.5)
Chloride: 102 mEq/L (ref 96–112)
GFR: 53.55 mL/min — ABNORMAL LOW (ref >60.00)
Glucose, Bld: 142 mg/dL — ABNORMAL HIGH (ref 70–99)
Potassium: 3.4 mEq/L — ABNORMAL LOW (ref 3.5–5.1)
Sodium: 134 mEq/L — ABNORMAL LOW (ref 135–145)

## 2014-02-16 NOTE — Patient Instructions (Addendum)
Your physician recommends that you continue on your current medications as directed. Please refer to the Current Medication list given to you today.  Lab Today: [facility]  Your physician has requested that you have an echocardiogram. Echocardiography is a painless test that uses sound waves to create images of your heart. It provides your doctor with information about the size and shape of your heart and how well your heart's chambers and valves are working. This procedure takes approximately one hour. There are no restrictions for this procedure.   Your physician wants you to follow-up in: 1 year You will receive a reminder letter in the mail two months in advance. If you don't receive a letter, please call our office to schedule the follow-up appointment.

## 2014-02-16 NOTE — Progress Notes (Signed)
Patient ID: [place] Lions, female   DOB: February 12, 1936, 78 y.o.   MRN: [id number]    [address], [address] [place], [place]  [address] Phone: [phone] Fax:  [phone]  Date:  02/16/2014   ID:  [place] Lions, DOB 06-Dec-1935, MRN [id number]  PCP:  [name], MD   ASSESSMENT:  1.  Chronic systolic heart failure without recent LV assessment 2. Hypertension 3. Hyperlipidemia 4. Diabetes mellitus, type II  PLAN:  1.  2-D Doppler echocardiogram to assess LV function   2. Basic metabolic panel 3. Clinical followup in one year   SUBJECTIVE: Olivia Mullen is a 78 y.o. female  who is doing well. His been having difficulty with compliance. Her husband has discontinued her ARB because of abdominal discomfort and tingling. I asked him not to do that and to get her back on therapy. She is known to have low LVEF of 35-40% at cath in 2013. She denies orthopnea, largely swelling, syncope, palpitations.   Wt Readings from Last 3 Encounters:  02/16/14 133 lb (60.328 kg)  10/11/13 147 lb (66.679 kg)  10/11/13 147 lb (66.679 kg)     Past Medical History  Diagnosis Date  . Rheumatoid arthritis(714.0) 12/16/2011  . Multinodular goiter (nontoxic) 12/16/2011  . Hypothyroidism (acquired) 12/16/2011  . Normochromic normocytic anemia 12/16/2011  . Anemia   . CHF (congestive heart failure)   . Psoriasis   . Depression   . UTI (lower urinary tract infection)   . Chronic systolic heart failure   . Anginal pain     long time ago  . Hypertension   . Hypercholesteremia     "doctor wants to get down to 100, now its 108"  . Anxiety   . Pneumonia     hx of, years ago  . Headache(784.0)     migraines  . History of kidney stones long time ago  . Cancer     right breast, 20 years ago  . Complication of anesthesia "long time ago"    one time woke up during surgery     Current Outpatient Prescriptions  Medication Sig Dispense Refill  . aspirin-acetaminophen-caffeine (EXCEDRIN  MIGRAINE) 250-250-65 MG per tablet Take by mouth every 6 (six) hours as needed for headache.      . carvedilol (COREG) 6.25 MG tablet Take 1 tablet (6.25 mg total) by mouth 2 (two) times daily with a meal.  60 tablet  5  . cholecalciferol (VITAMIN D) 1000 UNITS tablet Take 1,000 Units by mouth daily.      . clonazePAM (KLONOPIN) 0.5 MG tablet Take 0.5-1 mg by mouth 2 (two) times daily. 1 tablet in the am and 2 tablets in the pm.      . doxepin (SINEQUAN) 50 MG capsule Take 50 mg by mouth at bedtime.      . DULoxetine (CYMBALTA) 60 MG capsule Take 60 mg by mouth daily.       . folic acid (FOLVITE) 1 MG tablet 1 tab daily      . furosemide (LASIX) 20 MG tablet Take 1 tablet (20 mg total) by mouth every morning.  30 tablet  6  . HUMIRA PEN 40 MG/0.8ML injection 1 injection every other week      . leflunomide (ARAVA) 20 MG tablet Take 20 mg by mouth daily.      Marland Kitchen levothyroxine (SYNTHROID, LEVOTHROID) 75 MCG tablet Take 75 mcg by mouth daily before breakfast.      . LIALDA 1.2  G EC tablet Take 2 tabs every morning      . Loperamide HCl (IMODIUM A-D PO) Take by mouth. As needed      . Multiple Vitamins-Minerals (CENTRUM SILVER PO) Take by mouth. 1 tab daily      . potassium chloride SA (K-DUR,KLOR-CON) 20 MEQ tablet Take 1 tablet (20 mEq total) by mouth 2 (two) times daily.  60 tablet  5  . Probiotic Product (PROBIOTIC DAILY PO) Take by mouth. 1 tab daily      . simvastatin (ZOCOR) 10 MG tablet Take 10 mg by mouth daily.       No current facility-administered medications for this visit.    Allergies:    Allergies  Allergen Reactions  . Gabapentin     unknown  . Isosorbide     unknown  . Wellbutrin [Bupropion] Other (See Comments)    mental changes and increase anger  . Zonisamide     unknown    Social History:  The patient  reports that she quit smoking about 21 years ago. Her smoking use included Cigarettes. She smoked 3.00 packs per day. She has never used smokeless tobacco. She reports  that she drinks alcohol. She reports that she does not use illicit drugs.   ROS:  Please see the history of present illness.    frequent diarrhea related to colitis. Appetite has been good she has lost 40 pounds. She denies orthopnea and PND. No episodes of syncope. Diffuse paresthesias have been noted.    All other systems reviewed and negative.   OBJECTIVE: VS:  BP 114/50  Pulse 80  Ht 5\' 1"  (1.549 m)  Wt 133 lb (60.328 kg)  BMI 25.14 kg/m2 Well nourished, well developed, in no acute distress,  elderly HEENT: normal Neck: JVD  flat. Carotid bruit Acid   Cardiac:  normal S1, S2; RRR; no murmur Lungs:  clear to auscultation bilaterally, no wheezing, rhonchi or rales Abd: soft, nontender, no hepatomegaly Ext: Edema absent . Pulses 2+ Skin: warm and dry Neuro:  CNs 2-12 intact, no focal abnormalities noted  EKG:   not repeated        Signed, Illene Labrador III, MD 02/16/2014 11:58 AM

## 2014-02-17 ENCOUNTER — Telehealth: Payer: Self-pay

## 2014-02-17 NOTE — Telephone Encounter (Signed)
Message copied by Lamar Laundry on Thu Feb 17, 2014 10:55 AM ------      Message from: Daneen Schick      Created: Wed Feb 16, 2014  5:35 PM       Potassium is slightly low. Keep current therapy unchanged and make sure that potassium being taken as prescribed. ------

## 2014-02-17 NOTE — Telephone Encounter (Signed)
F/u    Pt's spouse Olivia Mullen returning your call.

## 2014-02-17 NOTE — Telephone Encounter (Signed)
pt husband returned call.pt husband aware of lab results.Potassium is slightly low. Keep current therapy unchanged and make sure that potassium being taken as prescribed.pt husband sts that K+ was on hold because pt has colittis.pt will resume K+20 meq bid.pt husband rqst copy of labs be mailed.

## 2014-02-17 NOTE — Telephone Encounter (Signed)
Message copied by Lamar Laundry on Thu Feb 17, 2014  9:35 AM ------      Message from: Daneen Schick      Created: Wed Feb 16, 2014  5:35 PM       Potassium is slightly low. Keep current therapy unchanged and make sure that potassium being taken as prescribed. ------

## 2014-02-17 NOTE — Telephone Encounter (Signed)
called to give pt lab results.lmom fo pt to call back 

## 2014-02-21 ENCOUNTER — Other Ambulatory Visit (HOSPITAL_COMMUNITY): Payer: Medicare Other

## 2014-02-21 DIAGNOSIS — R197 Diarrhea, unspecified: Secondary | ICD-10-CM | POA: Diagnosis not present

## 2014-02-25 DIAGNOSIS — R197 Diarrhea, unspecified: Secondary | ICD-10-CM | POA: Diagnosis not present

## 2014-02-25 DIAGNOSIS — K5289 Other specified noninfective gastroenteritis and colitis: Secondary | ICD-10-CM | POA: Diagnosis not present

## 2014-03-17 ENCOUNTER — Telehealth: Payer: Self-pay | Admitting: *Deleted

## 2014-03-17 ENCOUNTER — Other Ambulatory Visit: Payer: Self-pay

## 2014-03-17 ENCOUNTER — Other Ambulatory Visit: Payer: Self-pay | Admitting: *Deleted

## 2014-03-17 MED ORDER — LOSARTAN POTASSIUM 25 MG PO TABS: 25.0000 mg | ORAL_TABLET | Freq: Every day | ORAL | Status: DC

## 2014-03-17 NOTE — Telephone Encounter (Signed)
Called pt to verify what mg she is taking. No answer.

## 2014-03-17 NOTE — Telephone Encounter (Signed)
Spoke with patients husband and he stated that the losartan was previously filled for a 50mg  tablet and they were cutting them in half to equal 25mg  daily. He would like for me to send it in for a 25mg  tablet. I will send to the pharmacy.

## 2014-03-17 NOTE — Telephone Encounter (Signed)
[facility] requests losartan refill, but is the patient still to be taking this? Please advise. Thanks, MI

## 2014-03-21 DIAGNOSIS — R197 Diarrhea, unspecified: Secondary | ICD-10-CM | POA: Diagnosis not present

## 2014-03-21 DIAGNOSIS — R300 Dysuria: Secondary | ICD-10-CM | POA: Diagnosis not present

## 2014-03-21 DIAGNOSIS — K5289 Other specified noninfective gastroenteritis and colitis: Secondary | ICD-10-CM | POA: Diagnosis not present

## 2014-03-21 DIAGNOSIS — E860 Dehydration: Secondary | ICD-10-CM | POA: Diagnosis not present

## 2014-04-07 DIAGNOSIS — K5289 Other specified noninfective gastroenteritis and colitis: Secondary | ICD-10-CM | POA: Diagnosis not present

## 2014-04-14 DIAGNOSIS — N179 Acute kidney failure, unspecified: Secondary | ICD-10-CM | POA: Diagnosis not present

## 2014-05-30 DIAGNOSIS — Z79899 Other long term (current) drug therapy: Secondary | ICD-10-CM | POA: Diagnosis not present

## 2014-05-30 DIAGNOSIS — M2550 Pain in unspecified joint: Secondary | ICD-10-CM | POA: Diagnosis not present

## 2014-05-30 DIAGNOSIS — M545 Low back pain, unspecified: Secondary | ICD-10-CM | POA: Diagnosis not present

## 2014-05-30 DIAGNOSIS — M069 Rheumatoid arthritis, unspecified: Secondary | ICD-10-CM | POA: Diagnosis not present

## 2014-06-18 ENCOUNTER — Encounter: Payer: Self-pay | Admitting: Interventional Cardiology

## 2014-06-20 DIAGNOSIS — Z79899 Other long term (current) drug therapy: Secondary | ICD-10-CM | POA: Diagnosis not present

## 2014-06-20 DIAGNOSIS — M2550 Pain in unspecified joint: Secondary | ICD-10-CM | POA: Diagnosis not present

## 2014-06-20 DIAGNOSIS — M25519 Pain in unspecified shoulder: Secondary | ICD-10-CM | POA: Diagnosis not present

## 2014-06-20 DIAGNOSIS — M545 Low back pain, unspecified: Secondary | ICD-10-CM | POA: Diagnosis not present

## 2014-06-20 DIAGNOSIS — M069 Rheumatoid arthritis, unspecified: Secondary | ICD-10-CM | POA: Diagnosis not present

## 2014-07-18 ENCOUNTER — Other Ambulatory Visit: Payer: Self-pay | Admitting: *Deleted

## 2014-07-18 MED ORDER — POTASSIUM CHLORIDE CRYS ER 20 MEQ PO TBCR: 20.0000 meq | EXTENDED_RELEASE_TABLET | Freq: Two times a day (BID) | ORAL | Status: DC

## 2014-07-18 MED ORDER — CARVEDILOL 6.25 MG PO TABS: 6.2500 mg | ORAL_TABLET | Freq: Two times a day (BID) | ORAL | Status: DC

## 2014-08-02 DIAGNOSIS — R399 Unspecified symptoms and signs involving the genitourinary system: Secondary | ICD-10-CM | POA: Diagnosis not present

## 2014-08-11 DIAGNOSIS — Z23 Encounter for immunization: Secondary | ICD-10-CM | POA: Diagnosis not present

## 2014-08-12 DIAGNOSIS — N12 Tubulo-interstitial nephritis, not specified as acute or chronic: Secondary | ICD-10-CM | POA: Diagnosis not present

## 2014-08-12 DIAGNOSIS — E119 Type 2 diabetes mellitus without complications: Secondary | ICD-10-CM | POA: Diagnosis not present

## 2014-08-12 DIAGNOSIS — E039 Hypothyroidism, unspecified: Secondary | ICD-10-CM | POA: Diagnosis not present

## 2014-08-12 DIAGNOSIS — G4700 Insomnia, unspecified: Secondary | ICD-10-CM | POA: Diagnosis not present

## 2014-08-12 DIAGNOSIS — M0540 Rheumatoid myopathy with rheumatoid arthritis of unspecified site: Secondary | ICD-10-CM | POA: Diagnosis not present

## 2014-08-12 DIAGNOSIS — M1990 Unspecified osteoarthritis, unspecified site: Secondary | ICD-10-CM | POA: Diagnosis not present

## 2014-08-12 DIAGNOSIS — I10 Essential (primary) hypertension: Secondary | ICD-10-CM | POA: Diagnosis not present

## 2014-08-12 DIAGNOSIS — N390 Urinary tract infection, site not specified: Secondary | ICD-10-CM | POA: Diagnosis not present

## 2014-08-24 DIAGNOSIS — N390 Urinary tract infection, site not specified: Secondary | ICD-10-CM | POA: Diagnosis not present

## 2014-08-31 DIAGNOSIS — R197 Diarrhea, unspecified: Secondary | ICD-10-CM | POA: Diagnosis not present

## 2014-09-08 DIAGNOSIS — K5289 Other specified noninfective gastroenteritis and colitis: Secondary | ICD-10-CM | POA: Diagnosis not present

## 2014-09-08 DIAGNOSIS — B9689 Other specified bacterial agents as the cause of diseases classified elsewhere: Secondary | ICD-10-CM | POA: Diagnosis not present

## 2014-09-12 ENCOUNTER — Other Ambulatory Visit: Payer: Self-pay | Admitting: *Deleted

## 2014-09-12 ENCOUNTER — Telehealth: Payer: Self-pay | Admitting: Interventional Cardiology

## 2014-09-12 MED ORDER — FUROSEMIDE 20 MG PO TABS: 20.0000 mg | ORAL_TABLET | Freq: Every morning | ORAL | Status: DC

## 2014-09-12 NOTE — Telephone Encounter (Signed)
PT NEEDS REFILL OF FUROSIMIDE 20MG  , CHANGED PHARMACY FROM [facility] TO [facility] [address]

## 2014-09-13 ENCOUNTER — Other Ambulatory Visit: Payer: Self-pay

## 2014-09-13 MED ORDER — CARVEDILOL 6.25 MG PO TABS: 6.2500 mg | ORAL_TABLET | Freq: Two times a day (BID) | ORAL | Status: DC

## 2014-09-19 DIAGNOSIS — R197 Diarrhea, unspecified: Secondary | ICD-10-CM | POA: Diagnosis not present

## 2014-09-22 DIAGNOSIS — F331 Major depressive disorder, recurrent, moderate: Secondary | ICD-10-CM | POA: Diagnosis not present

## 2014-09-22 IMAGING — CR DG PORTABLE PELVIS
1 series · 1 of 1 positions shown · non-contrast
Comparison: Portable exam 7257 hr compared to 10/05/2013

CLINICAL DATA: Post right hip replacement

EXAM:
PORTABLE PELVIS 1-2 VIEWS

[AP]
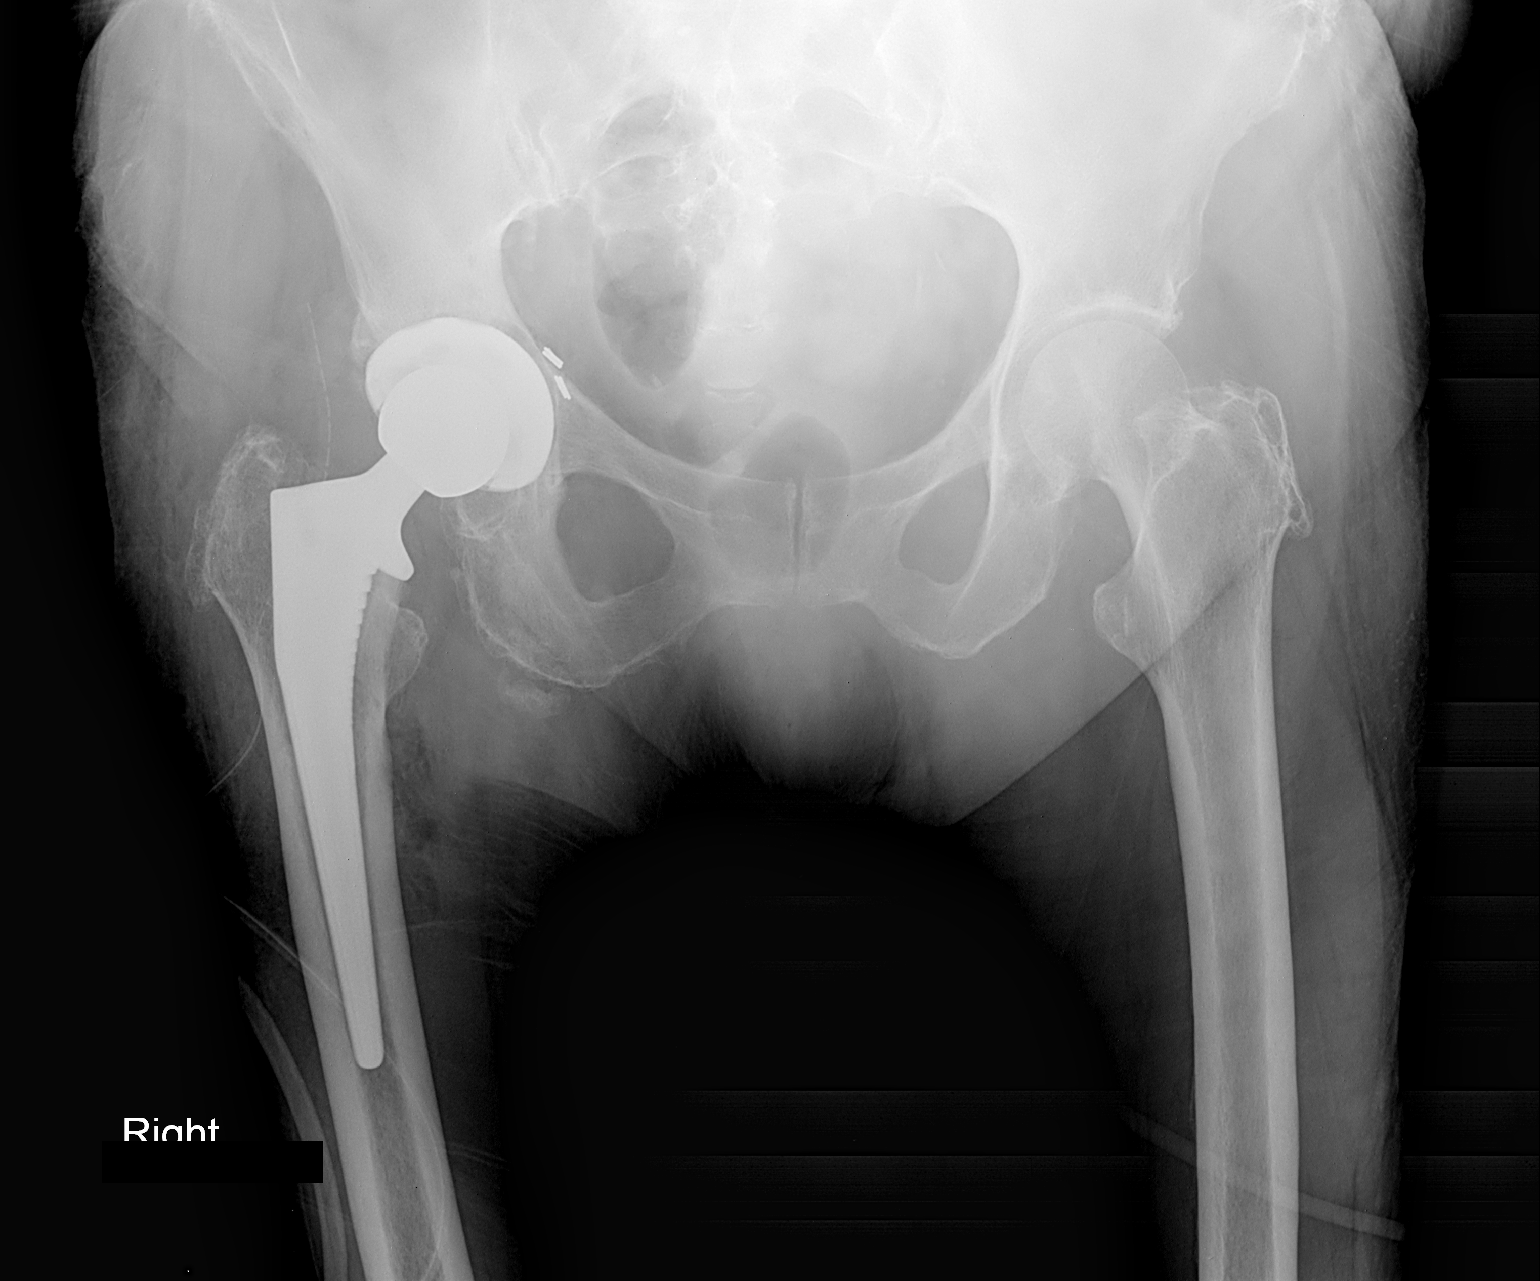

[1 of 1 positions shown; findings below may reference images not displayed]

FINDINGS: Interval right hip surgery with acetabular and femoral components of
a right hip prosthesis identified in expected positions.

Surgical drain present.

Bones appear demineralized.

No acute fracture dislocation seen.
IMPRESSION: Right hip prosthesis without acute complication.

## 2014-09-28 DIAGNOSIS — A047 Enterocolitis due to Clostridium difficile: Secondary | ICD-10-CM | POA: Diagnosis not present

## 2014-09-28 DIAGNOSIS — K529 Noninfective gastroenteritis and colitis, unspecified: Secondary | ICD-10-CM | POA: Diagnosis not present

## 2014-09-28 DIAGNOSIS — M545 Low back pain: Secondary | ICD-10-CM | POA: Diagnosis not present

## 2014-09-28 DIAGNOSIS — M2550 Pain in unspecified joint: Secondary | ICD-10-CM | POA: Diagnosis not present

## 2014-09-28 DIAGNOSIS — M0589 Other rheumatoid arthritis with rheumatoid factor of multiple sites: Secondary | ICD-10-CM | POA: Diagnosis not present

## 2014-10-04 DIAGNOSIS — L820 Inflamed seborrheic keratosis: Secondary | ICD-10-CM | POA: Diagnosis not present

## 2014-11-01 DIAGNOSIS — F329 Major depressive disorder, single episode, unspecified: Secondary | ICD-10-CM | POA: Diagnosis not present

## 2014-11-01 DIAGNOSIS — I10 Essential (primary) hypertension: Secondary | ICD-10-CM | POA: Diagnosis not present

## 2014-11-01 DIAGNOSIS — M797 Fibromyalgia: Secondary | ICD-10-CM | POA: Diagnosis not present

## 2014-11-01 DIAGNOSIS — E785 Hyperlipidemia, unspecified: Secondary | ICD-10-CM | POA: Diagnosis not present

## 2014-11-01 DIAGNOSIS — E039 Hypothyroidism, unspecified: Secondary | ICD-10-CM | POA: Diagnosis not present

## 2014-11-01 DIAGNOSIS — Z8639 Personal history of other endocrine, nutritional and metabolic disease: Secondary | ICD-10-CM | POA: Diagnosis not present

## 2014-12-20 DIAGNOSIS — H35373 Puckering of macula, bilateral: Secondary | ICD-10-CM | POA: Diagnosis not present

## 2014-12-20 DIAGNOSIS — H5203 Hypermetropia, bilateral: Secondary | ICD-10-CM | POA: Diagnosis not present

## 2014-12-20 DIAGNOSIS — H524 Presbyopia: Secondary | ICD-10-CM | POA: Diagnosis not present

## 2014-12-20 DIAGNOSIS — H52223 Regular astigmatism, bilateral: Secondary | ICD-10-CM | POA: Diagnosis not present

## 2014-12-20 DIAGNOSIS — H11153 Pinguecula, bilateral: Secondary | ICD-10-CM | POA: Diagnosis not present

## 2014-12-20 DIAGNOSIS — Z9849 Cataract extraction status, unspecified eye: Secondary | ICD-10-CM | POA: Diagnosis not present

## 2014-12-20 DIAGNOSIS — H18413 Arcus senilis, bilateral: Secondary | ICD-10-CM | POA: Diagnosis not present

## 2015-01-16 DIAGNOSIS — Z79899 Other long term (current) drug therapy: Secondary | ICD-10-CM | POA: Diagnosis not present

## 2015-01-16 DIAGNOSIS — M0589 Other rheumatoid arthritis with rheumatoid factor of multiple sites: Secondary | ICD-10-CM | POA: Diagnosis not present

## 2015-01-16 DIAGNOSIS — K5289 Other specified noninfective gastroenteritis and colitis: Secondary | ICD-10-CM | POA: Diagnosis not present

## 2015-01-16 DIAGNOSIS — M2550 Pain in unspecified joint: Secondary | ICD-10-CM | POA: Diagnosis not present

## 2015-02-09 ENCOUNTER — Other Ambulatory Visit: Payer: Self-pay

## 2015-02-09 MED ORDER — POTASSIUM CHLORIDE CRYS ER 20 MEQ PO TBCR: 20.0000 meq | EXTENDED_RELEASE_TABLET | Freq: Two times a day (BID) | ORAL | Status: DC

## 2015-02-19 DIAGNOSIS — E785 Hyperlipidemia, unspecified: Secondary | ICD-10-CM | POA: Insufficient documentation

## 2015-02-20 ENCOUNTER — Ambulatory Visit (INDEPENDENT_AMBULATORY_CARE_PROVIDER_SITE_OTHER): Payer: Medicare Other | Admitting: Interventional Cardiology

## 2015-02-20 ENCOUNTER — Encounter: Payer: Self-pay | Admitting: Interventional Cardiology

## 2015-02-20 VITALS — BP 138/70 | HR 79 | Ht 61.0 in | Wt 157.8 lb

## 2015-02-20 DIAGNOSIS — E118 Type 2 diabetes mellitus with unspecified complications: Secondary | ICD-10-CM

## 2015-02-20 DIAGNOSIS — I5022 Chronic systolic (congestive) heart failure: Secondary | ICD-10-CM | POA: Diagnosis not present

## 2015-02-20 DIAGNOSIS — E785 Hyperlipidemia, unspecified: Secondary | ICD-10-CM | POA: Diagnosis not present

## 2015-02-20 DIAGNOSIS — I10 Essential (primary) hypertension: Secondary | ICD-10-CM | POA: Diagnosis not present

## 2015-02-20 NOTE — Patient Instructions (Signed)
Medication Instructions:  Your physician recommends that you continue on your current medications as directed. Please refer to the Current Medication list given to you today.   Labwork: None   Testing/Procedures: Your physician has requested that you have an echocardiogram. Echocardiography is a painless test that uses sound waves to create images of your heart. It provides your doctor with information about the size and shape of your heart and how well your heart's chambers and valves are working. This procedure takes approximately one hour. There are no restrictions for this procedure.    Follow-Up: Your physician wants you to follow-up in: 1 year with Dr.Smith You will receive a reminder letter in the mail two months in advance. If you don't receive a letter, please call our office to schedule the follow-up appointment.   Any Other Special Instructions Will Be Listed Below (If Applicable). Stay Active

## 2015-02-20 NOTE — Progress Notes (Signed)
Cardiology Office Note   Date:  02/20/2015   ID:  [place] Lions, DOB 06-14-1936, MRN [id number]  PCP:  Olivia Naas, MD  Cardiologist:   Olivia Grooms, MD   Chief Complaint  Patient presents with  . Congestive Heart Failure      History of Present Illness: Olivia Mullen is a 79 y.o. 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  The patient has gained nearly 23 pounds since I saw her a year ago. She denies dyspnea but does them into a very sedentary lifestyle. She has not had chest discomfort. She denies orthopnea and PND. There is no lower extremity edema.    Past Medical History  Diagnosis Date  . Rheumatoid arthritis(714.0) 12/16/2011  . Multinodular goiter (nontoxic) 12/16/2011  . Hypothyroidism (acquired) 12/16/2011  . Normochromic normocytic anemia 12/16/2011  . Anemia   . CHF (congestive heart failure)   . Psoriasis   . Depression   . UTI (lower urinary tract infection)   . Chronic systolic heart failure   . Anginal pain     long time ago  . Hypertension   . Hypercholesteremia     "doctor wants to get down to 100, now its 108"  . Anxiety   . Pneumonia     hx of, years ago  . Headache(784.0)     migraines  . History of kidney stones long time ago  . Cancer     right breast, 20 years ago  . Complication of anesthesia "long time ago"    one time woke up during surgery     Past Surgical History  Procedure Laterality Date  . Mastectomy      right  . Carpal tunnel release Right   . Thyroid surgery    . Abdominal hysterectomy    . Left shoulder      "replaced joint with rod"  . Cholecystectomy    . Skin graft Right     , Calf  . Appendectomy    . Breast surgery  20 years ago    reduced left breast, reconstruction  . Cataract extraction Bilateral   . Tonsillectomy    . Total hip arthroplasty Right  10/11/2013    Procedure: RIGHT TOTAL HIP ARTHROPLASTY ANTERIOR APPROACH;  Surgeon: Gearlean Alf, MD;  Location: [facility];  Service: Orthopedics;  Laterality: Right;     Current Outpatient Prescriptions  Medication Sig Dispense Refill  . amoxicillin (AMOXIL) 500 MG capsule Take 4 capsules by mouth as needed. Prior to dental procedures  0  . aspirin-acetaminophen-caffeine (EXCEDRIN MIGRAINE) [phone] MG per tablet Take by mouth every 6 (six) hours as needed for headache.    . carvedilol (COREG) 6.25 MG tablet Take 1 tablet (6.25 mg total) by mouth 2 (two) times daily with a meal. 60 tablet 5  . cholecalciferol (VITAMIN D) 1000 UNITS tablet Take 1,000 Units by mouth daily.    . clonazePAM (KLONOPIN) 0.5 MG tablet Take 0.5-1 mg by mouth 2 (two) times daily. 1 tablet in the am and 2 tablets in the pm.    . doxepin (SINEQUAN) 50 MG capsule Take 50 mg by mouth at bedtime.    . DULoxetine (CYMBALTA) 60 MG capsule Take 60 mg by mouth daily.     . folic acid (FOLVITE) 1 MG tablet Take 1 mg by mouth daily.     . furosemide (LASIX)  20 MG tablet Take 1 tablet (20 mg total) by mouth every morning. 30 tablet 5  . HUMIRA PEN 40 MG/0.8ML injection 1 injection every other week    . leflunomide (ARAVA) 20 MG tablet Take 20 mg by mouth daily.    Marland Kitchen levothyroxine (SYNTHROID, LEVOTHROID) 75 MCG tablet Take 75 mcg by mouth daily before breakfast.    . Loperamide HCl (IMODIUM A-D PO) Take 1 tablet by mouth daily as needed (DIarreha).     . losartan (COZAAR) 25 MG tablet Take 1 tablet (25 mg total) by mouth daily. 30 tablet 11  . Magnesium 500 MG TABS Take 500 mg by mouth daily.    . Multiple Vitamins-Minerals (CENTRUM SILVER PO) Take 1 tablet by mouth daily.     . potassium chloride SA (K-DUR,KLOR-CON) 20 MEQ tablet Take 1 tablet (20 mEq total) by mouth 2 (two) times daily. 60 tablet 1  . Probiotic Product (PROBIOTIC DAILY PO) Take 1 tablet by mouth daily.     . simvastatin (ZOCOR) 10 MG tablet Take 10 mg by  mouth daily.    . traMADol (ULTRAM) 50 MG tablet Take 50 mg by mouth 2 (two) times daily as needed. PAIN  0   No current facility-administered medications for this visit.    Allergies:   Gabapentin; Isosorbide; Wellbutrin; and Zonisamide    Social History:  The patient  reports that she quit smoking about 22 years ago. Her smoking use included Cigarettes. She smoked 3.00 packs per day. She has never used smokeless tobacco. She reports that she drinks alcohol. She reports that she does not use illicit drugs.   Family History:  The patient's family history includes Healthy in her sister; Heart disease in her mother.    ROS:  Please see the history of present illness.   Otherwise, review of systems are positive for colitis and edema have markedly improved. Appetite is improved. Depression and overall sadness have been a significant issue..   All other systems are reviewed and negative.    PHYSICAL EXAM: VS:  BP 138/70 mmHg  Pulse 79  Ht 5\' 1"  (1.549 m)  Wt 157 lb 12.8 oz (71.578 kg)  BMI 29.83 kg/m2 , BMI Body mass index is 29.83 kg/(m^2). GEN: Well nourished, well developed, in no acute distress HEENT: normal Neck: no JVD, carotid bruits, or masses Cardiac: RRR; no murmurs, rubs, or gallops,no edema  Respiratory:  clear to auscultation bilaterally, normal work of breathing GI: soft, nontender, nondistended, + BS MS: no deformity or atrophy Skin: warm and dry, no rash Neuro:  Strength and sensation are intact Psych: euthymic mood, full affect   EKG:  EKG is ordered today. The ekg ordered today demonstrates normal sinus rhythm with poor R-wave progression and left anterior hemiblock/first-degree AV block.   Recent Labs: No results found for requested labs within last 365 days.    Lipid Panel No results found for: CHOL, TRIG, HDL, CHOLHDL, VLDL, LDLCALC, LDLDIRECT    Wt Readings from Last 3 Encounters:  02/20/15 157 lb 12.8 oz (71.578 kg)  02/16/14 133 lb (60.328 kg)    10/11/13 147 lb (66.679 kg)      Other studies Reviewed: Additional studies/ records that were reviewed today include: Reviewed the cardiac catheterization report from 2013. Normal coronary arteries were noted..   ASSESSMENT AND PLAN:  Chronic systolic heart failure: 84-16% 2013 by cath up from 20-25%.  Essential hypertension: Controlled  Hyperlipidemia: Followed by primary care  Type 2 diabetes mellitus with complication  Current medicines are reviewed at length with the patient today.  The patient does not have concerns regarding medicines.  The following changes have been made:  no change  Labs/ tests ordered today include:   Orders Placed This Encounter  Procedures  . EKG 12-Lead  . Echocardiogram     Disposition:   FU with HS in 1 year  Signed, Olivia Grooms, MD  02/20/2015 11:53 AM    [facility] [place], [place], [place]  [address] Phone: [phone]; Fax: [phone]

## 2015-02-21 DIAGNOSIS — N3090 Cystitis, unspecified without hematuria: Secondary | ICD-10-CM | POA: Diagnosis not present

## 2015-02-21 DIAGNOSIS — R300 Dysuria: Secondary | ICD-10-CM | POA: Diagnosis not present

## 2015-02-24 ENCOUNTER — Other Ambulatory Visit: Payer: Self-pay

## 2015-02-24 ENCOUNTER — Ambulatory Visit (HOSPITAL_COMMUNITY): Payer: Medicare Other | Attending: Cardiology

## 2015-02-24 DIAGNOSIS — I10 Essential (primary) hypertension: Secondary | ICD-10-CM | POA: Diagnosis not present

## 2015-02-24 DIAGNOSIS — E119 Type 2 diabetes mellitus without complications: Secondary | ICD-10-CM | POA: Insufficient documentation

## 2015-02-24 DIAGNOSIS — Z87891 Personal history of nicotine dependence: Secondary | ICD-10-CM | POA: Insufficient documentation

## 2015-02-24 DIAGNOSIS — E785 Hyperlipidemia, unspecified: Secondary | ICD-10-CM | POA: Insufficient documentation

## 2015-02-24 DIAGNOSIS — I5022 Chronic systolic (congestive) heart failure: Secondary | ICD-10-CM | POA: Insufficient documentation

## 2015-02-24 NOTE — Progress Notes (Signed)
2D Echo completed. 02/24/2015

## 2015-02-28 ENCOUNTER — Telehealth: Payer: Self-pay | Admitting: Interventional Cardiology

## 2015-02-28 NOTE — Telephone Encounter (Signed)
New message    Patient husband calling for echo test results.

## 2015-02-28 NOTE — Telephone Encounter (Signed)
-----   Message from Belva Crome, MD sent at 02/27/2015  7:30 PM EDT ----- Heart function is entirely back to normal. No change in therapy needed.

## 2015-02-28 NOTE — Telephone Encounter (Signed)
Pt husband aware of echo results with verbal understanding.  Heart function is entirely back to normal. No change in therapy needed.

## 2015-02-28 NOTE — Telephone Encounter (Signed)
Returned pt husbands call.[email or web address]

## 2015-02-28 NOTE — Telephone Encounter (Signed)
Follow Up     Pt's husband returning Lisa's phone call.

## 2015-03-10 ENCOUNTER — Other Ambulatory Visit: Payer: Self-pay | Admitting: Interventional Cardiology

## 2015-03-13 ENCOUNTER — Other Ambulatory Visit: Payer: Self-pay

## 2015-03-13 MED ORDER — LOSARTAN POTASSIUM 25 MG PO TABS: 25.0000 mg | ORAL_TABLET | Freq: Every day | ORAL | Status: DC

## 2015-04-18 DIAGNOSIS — M0589 Other rheumatoid arthritis with rheumatoid factor of multiple sites: Secondary | ICD-10-CM | POA: Diagnosis not present

## 2015-04-18 DIAGNOSIS — K5289 Other specified noninfective gastroenteritis and colitis: Secondary | ICD-10-CM | POA: Diagnosis not present

## 2015-04-18 DIAGNOSIS — Z79899 Other long term (current) drug therapy: Secondary | ICD-10-CM | POA: Diagnosis not present

## 2015-04-18 DIAGNOSIS — M2550 Pain in unspecified joint: Secondary | ICD-10-CM | POA: Diagnosis not present

## 2015-06-22 DIAGNOSIS — N390 Urinary tract infection, site not specified: Secondary | ICD-10-CM | POA: Diagnosis not present

## 2015-07-11 DIAGNOSIS — M0540 Rheumatoid myopathy with rheumatoid arthritis of unspecified site: Secondary | ICD-10-CM | POA: Diagnosis not present

## 2015-07-11 DIAGNOSIS — Z23 Encounter for immunization: Secondary | ICD-10-CM | POA: Diagnosis not present

## 2015-07-11 DIAGNOSIS — E119 Type 2 diabetes mellitus without complications: Secondary | ICD-10-CM | POA: Diagnosis not present

## 2015-07-11 DIAGNOSIS — I10 Essential (primary) hypertension: Secondary | ICD-10-CM | POA: Diagnosis not present

## 2015-07-11 DIAGNOSIS — Z0000 Encounter for general adult medical examination without abnormal findings: Secondary | ICD-10-CM | POA: Diagnosis not present

## 2015-07-11 DIAGNOSIS — E785 Hyperlipidemia, unspecified: Secondary | ICD-10-CM | POA: Diagnosis not present

## 2015-07-11 DIAGNOSIS — I5020 Unspecified systolic (congestive) heart failure: Secondary | ICD-10-CM | POA: Diagnosis not present

## 2015-07-11 DIAGNOSIS — Z1389 Encounter for screening for other disorder: Secondary | ICD-10-CM | POA: Diagnosis not present

## 2015-07-11 DIAGNOSIS — E039 Hypothyroidism, unspecified: Secondary | ICD-10-CM | POA: Diagnosis not present

## 2015-07-20 DIAGNOSIS — R51 Headache: Secondary | ICD-10-CM | POA: Diagnosis not present

## 2015-07-20 DIAGNOSIS — M0589 Other rheumatoid arthritis with rheumatoid factor of multiple sites: Secondary | ICD-10-CM | POA: Diagnosis not present

## 2015-07-20 DIAGNOSIS — M2550 Pain in unspecified joint: Secondary | ICD-10-CM | POA: Diagnosis not present

## 2015-07-20 DIAGNOSIS — Z79899 Other long term (current) drug therapy: Secondary | ICD-10-CM | POA: Diagnosis not present

## 2015-08-31 ENCOUNTER — Other Ambulatory Visit: Payer: Self-pay

## 2015-08-31 DIAGNOSIS — Z9889 Other specified postprocedural states: Secondary | ICD-10-CM

## 2015-08-31 DIAGNOSIS — N6002 Solitary cyst of left breast: Secondary | ICD-10-CM

## 2015-10-09 DIAGNOSIS — M0589 Other rheumatoid arthritis with rheumatoid factor of multiple sites: Secondary | ICD-10-CM | POA: Diagnosis not present

## 2015-10-09 DIAGNOSIS — Z79899 Other long term (current) drug therapy: Secondary | ICD-10-CM | POA: Diagnosis not present

## 2015-10-09 DIAGNOSIS — M2550 Pain in unspecified joint: Secondary | ICD-10-CM | POA: Diagnosis not present

## 2015-10-19 DIAGNOSIS — N390 Urinary tract infection, site not specified: Secondary | ICD-10-CM | POA: Diagnosis not present

## 2015-10-19 DIAGNOSIS — R109 Unspecified abdominal pain: Secondary | ICD-10-CM | POA: Diagnosis not present

## 2016-01-08 DIAGNOSIS — R197 Diarrhea, unspecified: Secondary | ICD-10-CM | POA: Diagnosis not present

## 2016-01-10 ENCOUNTER — Other Ambulatory Visit: Payer: Self-pay | Admitting: Gastroenterology

## 2016-01-10 DIAGNOSIS — R197 Diarrhea, unspecified: Secondary | ICD-10-CM | POA: Diagnosis not present

## 2016-01-10 DIAGNOSIS — K5289 Other specified noninfective gastroenteritis and colitis: Secondary | ICD-10-CM | POA: Diagnosis not present

## 2016-01-10 DIAGNOSIS — K52839 Microscopic colitis, unspecified: Secondary | ICD-10-CM | POA: Diagnosis not present

## 2016-01-10 DIAGNOSIS — Z8719 Personal history of other diseases of the digestive system: Secondary | ICD-10-CM | POA: Diagnosis not present

## 2016-01-10 DIAGNOSIS — K52831 Collagenous colitis: Secondary | ICD-10-CM | POA: Diagnosis not present

## 2016-01-18 DIAGNOSIS — K52831 Collagenous colitis: Secondary | ICD-10-CM | POA: Diagnosis not present

## 2016-01-18 DIAGNOSIS — Z8719 Personal history of other diseases of the digestive system: Secondary | ICD-10-CM | POA: Diagnosis not present

## 2016-01-19 ENCOUNTER — Other Ambulatory Visit (HOSPITAL_COMMUNITY): Payer: Self-pay | Admitting: Psychiatry

## 2016-02-19 DIAGNOSIS — M0540 Rheumatoid myopathy with rheumatoid arthritis of unspecified site: Secondary | ICD-10-CM | POA: Diagnosis not present

## 2016-02-19 DIAGNOSIS — R197 Diarrhea, unspecified: Secondary | ICD-10-CM | POA: Diagnosis not present

## 2016-02-19 DIAGNOSIS — M797 Fibromyalgia: Secondary | ICD-10-CM | POA: Diagnosis not present

## 2016-02-19 DIAGNOSIS — K5289 Other specified noninfective gastroenteritis and colitis: Secondary | ICD-10-CM | POA: Diagnosis not present

## 2016-02-19 DIAGNOSIS — Z8719 Personal history of other diseases of the digestive system: Secondary | ICD-10-CM | POA: Diagnosis not present

## 2016-02-19 DIAGNOSIS — E119 Type 2 diabetes mellitus without complications: Secondary | ICD-10-CM | POA: Diagnosis not present

## 2016-03-06 DIAGNOSIS — M2550 Pain in unspecified joint: Secondary | ICD-10-CM | POA: Diagnosis not present

## 2016-03-06 DIAGNOSIS — M0589 Other rheumatoid arthritis with rheumatoid factor of multiple sites: Secondary | ICD-10-CM | POA: Diagnosis not present

## 2016-03-06 DIAGNOSIS — Z79899 Other long term (current) drug therapy: Secondary | ICD-10-CM | POA: Diagnosis not present

## 2016-03-08 ENCOUNTER — Other Ambulatory Visit: Payer: Self-pay | Admitting: Interventional Cardiology

## 2016-03-09 ENCOUNTER — Other Ambulatory Visit: Payer: Self-pay | Admitting: Interventional Cardiology

## 2016-03-11 ENCOUNTER — Emergency Department (HOSPITAL_COMMUNITY): Payer: Medicare Other

## 2016-03-11 ENCOUNTER — Inpatient Hospital Stay (HOSPITAL_COMMUNITY)
Admission: EM | Admit: 2016-03-11 | Discharge: 2016-03-16 | DRG: 378 | Disposition: A | Discharge status: Skilled Nursing Facility | Payer: Medicare Other | Attending: Internal Medicine | Admitting: Internal Medicine

## 2016-03-11 ENCOUNTER — Encounter (HOSPITAL_COMMUNITY): Payer: Self-pay | Admitting: Emergency Medicine

## 2016-03-11 DIAGNOSIS — E039 Hypothyroidism, unspecified: Secondary | ICD-10-CM | POA: Diagnosis present

## 2016-03-11 DIAGNOSIS — D62 Acute posthemorrhagic anemia: Secondary | ICD-10-CM | POA: Diagnosis present

## 2016-03-11 DIAGNOSIS — I5032 Chronic diastolic (congestive) heart failure: Secondary | ICD-10-CM | POA: Diagnosis present

## 2016-03-11 DIAGNOSIS — K52831 Collagenous colitis: Secondary | ICD-10-CM | POA: Diagnosis present

## 2016-03-11 DIAGNOSIS — E7800 Pure hypercholesterolemia, unspecified: Secondary | ICD-10-CM | POA: Diagnosis present

## 2016-03-11 DIAGNOSIS — K254 Chronic or unspecified gastric ulcer with hemorrhage: Secondary | ICD-10-CM | POA: Diagnosis not present

## 2016-03-11 DIAGNOSIS — K922 Gastrointestinal hemorrhage, unspecified: Secondary | ICD-10-CM | POA: Diagnosis not present

## 2016-03-11 DIAGNOSIS — D649 Anemia, unspecified: Secondary | ICD-10-CM | POA: Diagnosis not present

## 2016-03-11 DIAGNOSIS — R278 Other lack of coordination: Secondary | ICD-10-CM | POA: Diagnosis not present

## 2016-03-11 DIAGNOSIS — Z791 Long term (current) use of non-steroidal anti-inflammatories (NSAID): Secondary | ICD-10-CM | POA: Diagnosis not present

## 2016-03-11 DIAGNOSIS — R2689 Other abnormalities of gait and mobility: Secondary | ICD-10-CM | POA: Diagnosis not present

## 2016-03-11 DIAGNOSIS — E876 Hypokalemia: Secondary | ICD-10-CM | POA: Diagnosis present

## 2016-03-11 DIAGNOSIS — E1165 Type 2 diabetes mellitus with hyperglycemia: Secondary | ICD-10-CM | POA: Diagnosis not present

## 2016-03-11 DIAGNOSIS — E119 Type 2 diabetes mellitus without complications: Secondary | ICD-10-CM

## 2016-03-11 DIAGNOSIS — Z96612 Presence of left artificial shoulder joint: Secondary | ICD-10-CM | POA: Diagnosis present

## 2016-03-11 DIAGNOSIS — Z9049 Acquired absence of other specified parts of digestive tract: Secondary | ICD-10-CM | POA: Diagnosis not present

## 2016-03-11 DIAGNOSIS — E118 Type 2 diabetes mellitus with unspecified complications: Secondary | ICD-10-CM | POA: Diagnosis not present

## 2016-03-11 DIAGNOSIS — I10 Essential (primary) hypertension: Secondary | ICD-10-CM | POA: Diagnosis present

## 2016-03-11 DIAGNOSIS — Z66 Do not resuscitate: Secondary | ICD-10-CM | POA: Diagnosis present

## 2016-03-11 DIAGNOSIS — Z87891 Personal history of nicotine dependence: Secondary | ICD-10-CM | POA: Diagnosis not present

## 2016-03-11 DIAGNOSIS — K830 Cholangitis: Secondary | ICD-10-CM | POA: Diagnosis present

## 2016-03-11 DIAGNOSIS — K210 Gastro-esophageal reflux disease with esophagitis: Secondary | ICD-10-CM | POA: Diagnosis present

## 2016-03-11 DIAGNOSIS — I110 Hypertensive heart disease with heart failure: Secondary | ICD-10-CM | POA: Diagnosis present

## 2016-03-11 DIAGNOSIS — Z8249 Family history of ischemic heart disease and other diseases of the circulatory system: Secondary | ICD-10-CM

## 2016-03-11 DIAGNOSIS — Z79899 Other long term (current) drug therapy: Secondary | ICD-10-CM | POA: Diagnosis not present

## 2016-03-11 DIAGNOSIS — T39395A Adverse effect of other nonsteroidal anti-inflammatory drugs [NSAID], initial encounter: Secondary | ICD-10-CM | POA: Diagnosis present

## 2016-03-11 DIAGNOSIS — K921 Melena: Secondary | ICD-10-CM | POA: Diagnosis not present

## 2016-03-11 DIAGNOSIS — R531 Weakness: Secondary | ICD-10-CM | POA: Diagnosis not present

## 2016-03-11 DIAGNOSIS — B3781 Candidal esophagitis: Secondary | ICD-10-CM | POA: Diagnosis present

## 2016-03-11 DIAGNOSIS — R404 Transient alteration of awareness: Secondary | ICD-10-CM | POA: Diagnosis not present

## 2016-03-11 DIAGNOSIS — Z96641 Presence of right artificial hip joint: Secondary | ICD-10-CM | POA: Diagnosis present

## 2016-03-11 DIAGNOSIS — Z87442 Personal history of urinary calculi: Secondary | ICD-10-CM

## 2016-03-11 DIAGNOSIS — K259 Gastric ulcer, unspecified as acute or chronic, without hemorrhage or perforation: Secondary | ICD-10-CM | POA: Diagnosis not present

## 2016-03-11 DIAGNOSIS — I5022 Chronic systolic (congestive) heart failure: Secondary | ICD-10-CM | POA: Diagnosis present

## 2016-03-11 DIAGNOSIS — K250 Acute gastric ulcer with hemorrhage: Secondary | ICD-10-CM | POA: Diagnosis not present

## 2016-03-11 DIAGNOSIS — K3189 Other diseases of stomach and duodenum: Secondary | ICD-10-CM | POA: Diagnosis not present

## 2016-03-11 DIAGNOSIS — R7989 Other specified abnormal findings of blood chemistry: Secondary | ICD-10-CM | POA: Diagnosis not present

## 2016-03-11 DIAGNOSIS — D500 Iron deficiency anemia secondary to blood loss (chronic): Secondary | ICD-10-CM | POA: Diagnosis not present

## 2016-03-11 HISTORY — DX: Collagenous colitis: K52.831

## 2016-03-11 LAB — CBC
HEMATOCRIT: 20.2 % — AB (ref 36.0–46.0)
HEMOGLOBIN: 6.3 g/dL — AB (ref 12.0–15.0)
MCH: 29.3 pg (ref 26.0–34.0)
MCHC: 31.2 g/dL (ref 30.0–36.0)
MCV: 94 fL (ref 78.0–100.0)
Platelets: 171 10*3/uL (ref 150–400)
RBC: 2.15 MIL/uL — AB (ref 3.87–5.11)
RDW: 18 % — AB (ref 11.5–15.5)
WBC: 11.7 10*3/uL — AB (ref 4.0–10.5)

## 2016-03-11 LAB — I-STAT TROPONIN, ED: Troponin i, poc: 0.16 ng/mL (ref 0.00–0.08)

## 2016-03-11 LAB — COMPREHENSIVE METABOLIC PANEL
ALBUMIN: 2.9 g/dL — AB (ref 3.5–5.0)
ALT: 29 U/L (ref 14–54)
AST: 31 U/L (ref 15–41)
Alkaline Phosphatase: 55 U/L (ref 38–126)
Anion gap: 8 (ref 5–15)
BUN: 38 mg/dL — ABNORMAL HIGH (ref 6–20)
CHLORIDE: 104 mmol/L (ref 101–111)
CO2: 26 mmol/L (ref 22–32)
CREATININE: 1.01 mg/dL — AB (ref 0.44–1.00)
Calcium: 7.5 mg/dL — ABNORMAL LOW (ref 8.9–10.3)
GFR calc non Af Amer: 52 mL/min — ABNORMAL LOW (ref >60)
Glucose, Bld: 172 mg/dL — ABNORMAL HIGH (ref 65–99)
Potassium: 2.6 mmol/L — CL (ref 3.5–5.1)
SODIUM: 138 mmol/L (ref 135–145)
Total Bilirubin: 0.9 mg/dL (ref 0.3–1.2)
Total Protein: 4.7 g/dL — ABNORMAL LOW (ref 6.5–8.1)

## 2016-03-11 LAB — BASIC METABOLIC PANEL
ANION GAP: 9 (ref 5–15)
BUN: 39 mg/dL — ABNORMAL HIGH (ref 6–20)
CO2: 26 mmol/L (ref 22–32)
Calcium: 7.5 mg/dL — ABNORMAL LOW (ref 8.9–10.3)
Chloride: 105 mmol/L (ref 101–111)
Creatinine, Ser: 0.97 mg/dL (ref 0.44–1.00)
GFR, EST NON AFRICAN AMERICAN: 55 mL/min — AB (ref >60)
Glucose, Bld: 172 mg/dL — ABNORMAL HIGH (ref 65–99)
POTASSIUM: 2.6 mmol/L — AB (ref 3.5–5.1)
SODIUM: 140 mmol/L (ref 135–145)

## 2016-03-11 LAB — POC OCCULT BLOOD, ED: FECAL OCCULT BLD: POSITIVE — AB

## 2016-03-11 LAB — DIFFERENTIAL
BASOS ABS: 0 10*3/uL (ref 0.0–0.1)
Basophils Relative: 0 %
Eosinophils Absolute: 0.1 10*3/uL (ref 0.0–0.7)
Eosinophils Relative: 1 %
LYMPHS ABS: 1.7 10*3/uL (ref 0.7–4.0)
Lymphocytes Relative: 15 %
MONOS PCT: 6 %
Monocytes Absolute: 0.7 10*3/uL (ref 0.1–1.0)
NEUTROS ABS: 8.7 10*3/uL — AB (ref 1.7–7.7)
Neutrophils Relative %: 78 %

## 2016-03-11 LAB — APTT: aPTT: 21 seconds — ABNORMAL LOW (ref 24–37)

## 2016-03-11 LAB — PROTIME-INR
INR: 1.1 (ref 0.00–1.49)
PROTHROMBIN TIME: 14 s (ref 11.6–15.2)

## 2016-03-11 LAB — BRAIN NATRIURETIC PEPTIDE: B Natriuretic Peptide: 60.9 pg/mL (ref 0.0–100.0)

## 2016-03-11 LAB — PREPARE RBC (CROSSMATCH)

## 2016-03-11 LAB — CBG MONITORING, ED: Glucose-Capillary: 148 mg/dL — ABNORMAL HIGH (ref 65–99)

## 2016-03-11 MED ORDER — ONDANSETRON HCL 4 MG PO TABS: 4.0000 mg | ORAL_TABLET | Freq: Four times a day (QID) | ORAL | Status: DC | PRN

## 2016-03-11 MED ORDER — SODIUM CHLORIDE 0.9 % IV SOLN
Freq: Once | INTRAVENOUS | Status: DC
Start: 2016-03-11 — End: 2016-03-16

## 2016-03-11 MED ORDER — VITAMIN D 1000 UNITS PO TABS
2000.0000 [IU] | ORAL_TABLET | Freq: Every day | ORAL | Status: DC
  Administered 2016-03-11 – 2016-03-16 (×5): 2000 [IU] via ORAL
  Filled 2016-03-11 (×5): qty 2

## 2016-03-11 MED ORDER — GLYCOPYRROLATE 1 MG PO TABS
2.0000 mg | ORAL_TABLET | Freq: Two times a day (BID) | ORAL | Status: DC
  Administered 2016-03-11 – 2016-03-16 (×9): 2 mg via ORAL
  Filled 2016-03-11 (×11): qty 2

## 2016-03-11 MED ORDER — CARVEDILOL 6.25 MG PO TABS
6.2500 mg | ORAL_TABLET | Freq: Two times a day (BID) | ORAL | Status: DC
  Administered 2016-03-11 – 2016-03-16 (×10): 6.25 mg via ORAL
  Filled 2016-03-11 (×10): qty 1

## 2016-03-11 MED ORDER — LEVOTHYROXINE SODIUM 50 MCG PO TABS
75.0000 ug | ORAL_TABLET | Freq: Every day | ORAL | Status: DC
  Administered 2016-03-12 – 2016-03-16 (×5): 75 ug via ORAL
  Filled 2016-03-11 (×5): qty 1

## 2016-03-11 MED ORDER — HYDROCORTISONE NA SUCCINATE PF 100 MG IJ SOLR
50.0000 mg | Freq: Three times a day (TID) | INTRAMUSCULAR | Status: DC
  Administered 2016-03-11 – 2016-03-12 (×2): 50 mg via INTRAVENOUS
  Filled 2016-03-11 (×2): qty 2

## 2016-03-11 MED ORDER — ACETAMINOPHEN 650 MG RE SUPP: 650.0000 mg | Freq: Four times a day (QID) | RECTAL | Status: DC | PRN

## 2016-03-11 MED ORDER — BOOST / RESOURCE BREEZE PO LIQD
1.0000 | Freq: Three times a day (TID) | ORAL | Status: DC
  Administered 2016-03-11: 1 via ORAL

## 2016-03-11 MED ORDER — FOLIC ACID 1 MG PO TABS
1.0000 mg | ORAL_TABLET | Freq: Every day | ORAL | Status: DC
  Administered 2016-03-11 – 2016-03-16 (×5): 1 mg via ORAL
  Filled 2016-03-11 (×5): qty 1

## 2016-03-11 MED ORDER — LEFLUNOMIDE 20 MG PO TABS
20.0000 mg | ORAL_TABLET | Freq: Every day | ORAL | Status: DC
  Administered 2016-03-11 – 2016-03-16 (×5): 20 mg via ORAL
  Filled 2016-03-11 (×6): qty 1

## 2016-03-11 MED ORDER — TRAMADOL HCL 50 MG PO TABS
50.0000 mg | ORAL_TABLET | Freq: Four times a day (QID) | ORAL | Status: DC | PRN
  Administered 2016-03-11 – 2016-03-16 (×9): 50 mg via ORAL
  Filled 2016-03-11 (×9): qty 1

## 2016-03-11 MED ORDER — ACETAMINOPHEN 325 MG PO TABS
650.0000 mg | ORAL_TABLET | Freq: Four times a day (QID) | ORAL | Status: DC | PRN
  Administered 2016-03-12 – 2016-03-15 (×5): 650 mg via ORAL
  Filled 2016-03-11 (×5): qty 2

## 2016-03-11 MED ORDER — SODIUM CHLORIDE 0.9 % IV SOLN
INTRAVENOUS | Status: DC
  Administered 2016-03-12: 14:00:00 via INTRAVENOUS

## 2016-03-11 MED ORDER — CLONAZEPAM 1 MG PO TABS
1.0000 mg | ORAL_TABLET | Freq: Every day | ORAL | Status: DC
  Administered 2016-03-11 – 2016-03-15 (×5): 1 mg via ORAL
  Filled 2016-03-11 (×5): qty 1

## 2016-03-11 MED ORDER — POTASSIUM CHLORIDE 10 MEQ/100ML IV SOLN
10.0000 meq | Freq: Once | INTRAVENOUS | Status: AC
  Administered 2016-03-11: 10 meq via INTRAVENOUS
  Filled 2016-03-11: qty 100

## 2016-03-11 MED ORDER — POTASSIUM CHLORIDE CRYS ER 20 MEQ PO TBCR
40.0000 meq | EXTENDED_RELEASE_TABLET | ORAL | Status: AC
  Administered 2016-03-11 (×2): 40 meq via ORAL
  Filled 2016-03-11 (×2): qty 2

## 2016-03-11 MED ORDER — RISAQUAD PO CAPS
1.0000 | ORAL_CAPSULE | Freq: Every day | ORAL | Status: DC
  Administered 2016-03-11 – 2016-03-16 (×5): 1 via ORAL
  Filled 2016-03-11 (×6): qty 1

## 2016-03-11 MED ORDER — ONDANSETRON HCL 4 MG/2ML IJ SOLN
4.0000 mg | Freq: Four times a day (QID) | INTRAMUSCULAR | Status: DC | PRN
  Administered 2016-03-12: 4 mg via INTRAVENOUS

## 2016-03-11 MED ORDER — DOXEPIN HCL 50 MG PO CAPS
50.0000 mg | ORAL_CAPSULE | Freq: Every day | ORAL | Status: DC
  Administered 2016-03-11 – 2016-03-15 (×5): 50 mg via ORAL
  Filled 2016-03-11 (×6): qty 1

## 2016-03-11 MED ORDER — DULOXETINE HCL 60 MG PO CPEP
120.0000 mg | ORAL_CAPSULE | Freq: Every day | ORAL | Status: DC
  Administered 2016-03-13 – 2016-03-16 (×4): 120 mg via ORAL
  Filled 2016-03-11 (×4): qty 2

## 2016-03-11 MED ORDER — PROBIOTIC DAILY PO CAPS: ORAL_CAPSULE | Freq: Every day | ORAL | Status: DC

## 2016-03-11 MED ORDER — SIMVASTATIN 10 MG PO TABS
10.0000 mg | ORAL_TABLET | Freq: Every day | ORAL | Status: DC
  Administered 2016-03-11 – 2016-03-15 (×5): 10 mg via ORAL
  Filled 2016-03-11 (×5): qty 1

## 2016-03-11 MED ORDER — SODIUM CHLORIDE 0.9 % IV SOLN
INTRAVENOUS | Status: DC
  Administered 2016-03-11: 17:00:00 via INTRAVENOUS

## 2016-03-11 MED ORDER — PANTOPRAZOLE SODIUM 40 MG IV SOLR
40.0000 mg | Freq: Two times a day (BID) | INTRAVENOUS | Status: DC
  Administered 2016-03-11 – 2016-03-12 (×2): 40 mg via INTRAVENOUS
  Filled 2016-03-11 (×2): qty 40

## 2016-03-11 NOTE — Consult Note (Signed)
Subjective:   HPI  The patient is a 80 year old female whose primary gastroenterologist is Dr. Laurence Spates. She has a history of collagenous colitis which seems to be under control at this time with prednisone. She also has been put on dicyclomine recently for abdominal cramping. She has other medical problems including congestive heart failure and rheumatoid arthritis. For the past 5 or 6 days she has been feeling very weak and rundown. 5 days ago she had a black tarry stool. Because of ongoing weakness she finally came to the emergency department to be evaluated. She also collapsed today at home but did not lose consciousness. She was found to have a hemoglobin of 6.3 and her stool was heme positive. She takes ibuprofen on a regular basis for headaches. She did vomit but did not vomit any blood.  Review of Systems She denies chest pain she is feeling very weak and rundown  Past Medical History  Diagnosis Date  . Rheumatoid arthritis(714.0) 12/16/2011  . Multinodular goiter (nontoxic) 12/16/2011  . Hypothyroidism (acquired) 12/16/2011  . Normochromic normocytic anemia 12/16/2011  . Anemia   . CHF (congestive heart failure) ([place])   . Psoriasis   . Depression   . UTI (lower urinary tract infection)   . Chronic systolic heart failure ([place])   . Anginal pain ([place])     long time ago  . Hypertension   . Hypercholesteremia     "doctor wants to get down to 100, now its 108"  . Anxiety   . Pneumonia     hx of, years ago  . Headache(784.0)     migraines  . History of kidney stones long time ago  . Cancer ([place])     right breast, 20 years ago  . Complication of anesthesia "long time ago"    one time woke up during surgery   . Colitis, collagenous    Past Surgical History  Procedure Laterality Date  . Mastectomy      right  . Carpal tunnel release Right   . Thyroid surgery    . Abdominal hysterectomy    . Left shoulder      "replaced joint with rod"  . Cholecystectomy    . Skin graft  Right     , Calf  . Appendectomy    . Breast surgery  20 years ago    reduced left breast, reconstruction  . Cataract extraction Bilateral   . Tonsillectomy    . Total hip arthroplasty Right 10/11/2013    Procedure: RIGHT TOTAL HIP ARTHROPLASTY ANTERIOR APPROACH;  Surgeon: Gearlean Alf, MD;  Location: [facility];  Service: Orthopedics;  Laterality: Right;   Social History   Social History  . Marital Status: Married    Spouse Name: N/A  . Number of Children: N/A  . Years of Education: N/A   Occupational History  . Not on file.   Social History Main Topics  . Smoking status: Former Smoker -- 3.00 packs/day    Types: Cigarettes    Quit date: 10/21/1992  . Smokeless tobacco: Never Used  . Alcohol Use: Yes     Comment: occasional  . Drug Use: No  . Sexual Activity: No   Other Topics Concern  . Not on file   Social History Narrative   family history includes Healthy in her sister; Heart disease in her mother.  Current facility-administered medications:  .  0.9 %  sodium chloride infusion, , Intravenous, Continuous, Dorie Rank, MD, Last Rate: 125 mL/hr at 03/11/16  1630 .  0.9 %  sodium chloride infusion, , Intravenous, Once, Dorie Rank, MD .  potassium chloride 10 mEq in 100 mL IVPB, 10 mEq, Intravenous, Once, Dorie Rank, MD .  potassium chloride SA (K-DUR,KLOR-CON) CR tablet 40 mEq, 40 mEq, Oral, Q2H, Domenic Polite, MD  Current outpatient prescriptions:  .  aspirin-acetaminophen-caffeine (EXCEDRIN MIGRAINE) [phone] MG per tablet, Take 2 tablets by mouth every 6 (six) hours as needed for headache. , Disp: , Rfl:  .  carvedilol (COREG) 6.25 MG tablet, TAKE 1 TABLET (6.25 MG TOTAL) BY MOUTH 2 (TWO) TIMES DAILY WITH A MEAL., Disp: 60 tablet, Rfl: 10 .  cholecalciferol (VITAMIN D) 1000 UNITS tablet, Take 2,000 Units by mouth daily. , Disp: , Rfl:  .  clonazePAM (KLONOPIN) 0.5 MG tablet, Take 1 mg by mouth at bedtime. 1 tablet in the am and 2 tablets in the pm., Disp: , Rfl:  .   doxepin (SINEQUAN) 25 MG capsule, Take 50 mg by mouth at bedtime., Disp: , Rfl:  .  DULoxetine (CYMBALTA) 60 MG capsule, Take 120 mg by mouth daily. , Disp: , Rfl:  .  folic acid (FOLVITE) 1 MG tablet, Take 1 mg by mouth daily. , Disp: , Rfl:  .  furosemide (LASIX) 20 MG tablet, Take 1 tablet (20 mg total) by mouth daily. PLEASE CONTACT OFFICE FOR ADDITIONAL REFILLS, Disp: 30 tablet, Rfl: 1 .  glycopyrrolate (ROBINUL) 2 MG tablet, Take 2 mg by mouth 2 (two) times daily., Disp: , Rfl: 6 .  HUMIRA PEN 40 MG/0.8ML injection, 1 injection every other week, Disp: , Rfl:  .  ibuprofen (ADVIL,MOTRIN) 200 MG tablet, Take 400 mg by mouth every 6 (six) hours as needed for headache or moderate pain., Disp: , Rfl:  .  leflunomide (ARAVA) 20 MG tablet, Take 20 mg by mouth daily., Disp: , Rfl:  .  levothyroxine (SYNTHROID, LEVOTHROID) 75 MCG tablet, Take 75 mcg by mouth daily before breakfast., Disp: , Rfl:  .  losartan (COZAAR) 25 MG tablet, Take 1 tablet (25 mg total) by mouth daily., Disp: 30 tablet, Rfl: 11 .  Multiple Vitamins-Minerals (ADULT GUMMY PO), Take 2 tablets by mouth daily., Disp: , Rfl:  .  predniSONE (DELTASONE) 5 MG tablet, Take 10-20 mg by mouth 2 (two) times daily. 10 mgs in the morning, 20mg s at noon daily, Disp: , Rfl:  .  Probiotic Product (PROBIOTIC DAILY PO), Take 2 tablets by mouth daily. , Disp: , Rfl:  .  rizatriptan (MAXALT) 10 MG tablet, Take 10 mg by mouth daily as needed for migraine. , Disp: , Rfl:  .  simvastatin (ZOCOR) 10 MG tablet, Take 10 mg by mouth daily., Disp: , Rfl:  .  traMADol (ULTRAM) 50 MG tablet, Take 50 mg by mouth. Take 50 mgs at lunch, 50mg s in the evening and 50mg s as needed for pian, Disp: , Rfl: 0 .  KLOR-CON M20 20 MEQ tablet, TAKE 1 TABLET (20 MEQ TOTAL) BY MOUTH 2 (TWO) TIMES DAILY. (Patient not taking: Reported on 03/11/2016), Disp: 60 tablet, Rfl: 10 Allergies  Allergen Reactions  . Gabapentin Itching  . Isosorbide Nitrate Itching  . Wellbutrin  [Bupropion] Other (See Comments)    mental changes and increase anger  . Zonisamide Itching     Objective:     BP 142/51 mmHg  Pulse 100  Temp(Src) 98.4 F (36.9 C) (Oral)  Resp 18  SpO2 92%  She is alert, and in no acute distress  Skin is very pale  Heart regular rhythm no murmurs  Lungs clear  Abdomen: Bowel sounds present, soft, nontender, no obvious hepatosplenomegaly  Laboratory No components found for: D1    Assessment:     Blood loss anemia  Heme-positive stool  Recent melena  History of collagenous colitis  Other medical problems as mentioned above      Plan:     Agree with admission to the hospital. PPI therapy. Transfuse blood as needed. We will proceed with EGD tomorrow to further investigate the upper GI tract. Lab Results  Component Value Date   HGB 6.3* 03/11/2016   HGB 8.6* 10/13/2013   HGB 9.2* 10/12/2013   HGB 12.7 12/16/2011   HGB 11.1* 09/02/2008   HGB 13.1 09/01/2007   HCT 20.2* 03/11/2016   HCT 26.2* 10/13/2013   HCT 27.8* 10/12/2013   HCT 38.6 12/16/2011   HCT 32.8* 09/02/2008   HCT 37.4 09/01/2007   ALKPHOS 55 03/11/2016   ALKPHOS 89 10/05/2013   ALKPHOS 50 12/16/2011   AST 31 03/11/2016   AST 24 10/05/2013   AST 17 12/16/2011   ALT 29 03/11/2016   ALT 20 10/05/2013   ALT 22 12/16/2011

## 2016-03-11 NOTE — H&P (Signed)
History and Physical    Olivia Mullen [id number] DOB: [birth year] DOA: 03/11/2016  Referring MD/NP/PA:  PCP: [name], MD  Outpatient Specialists: Gi Dr. Oletta Lamas and R[facility]um: Dr.Hawkes Patient coming from: home  Chief Complaint: weakness  HPI: Olivia Mullen is a 80 y.o. female with medical history significant of Chronic systolic CHF, Collagenous colitis on prednisone (20mg ) and RA on prednisone (10mg ), HTN, presents to the ER due to weakness. History provided by sister who reports increasing abd pain since Wednesday, she had melena on thursday-described as black and tarry.she has been taking glycopyrrolate for this. She was getting weaker, and eventually fell down and collapsed today when her friend was helping her and brought to the ER. In ER, found to have Hb of 6.3 down from 8-5-9 range. She takes Ibuprofen everyday for headaches too  Review of Systems: As per HPI otherwise 10 point review of systems negative.    Past Medical History  Diagnosis Date  . Rheumatoid arthritis(714.0) 12/16/2011  . Multinodular goiter (nontoxic) 12/16/2011  . Hypothyroidism (acquired) 12/16/2011  . Normochromic normocytic anemia 12/16/2011  . Anemia   . CHF (congestive heart failure) ([place])   . Psoriasis   . Depression   . UTI (lower urinary tract infection)   . Chronic systolic heart failure ([place])   . Anginal pain ([place])     long time ago  . Hypertension   . Hypercholesteremia     "doctor wants to get down to 100, now its 108"  . Anxiety   . Pneumonia     hx of, years ago  . Headache(784.0)     migraines  . History of kidney stones long time ago  . Cancer ([place])     right breast, 20 years ago  . Complication of anesthesia "long time ago"    one time woke up during surgery   . Colitis, collagenous     Past Surgical History  Procedure Laterality Date  . Mastectomy      right  . Carpal tunnel release Right   . Thyroid surgery    . Abdominal hysterectomy    . Left shoulder       "replaced joint with rod"  . Cholecystectomy    . Skin graft Right     , Calf  . Appendectomy    . Breast surgery  20 years ago    reduced left breast, reconstruction  . Cataract extraction Bilateral   . Tonsillectomy    . Total hip arthroplasty Right 10/11/2013    Procedure: RIGHT TOTAL HIP ARTHROPLASTY ANTERIOR APPROACH;  Surgeon: Gearlean Alf, MD;  Location: [facility];  Service: Orthopedics;  Laterality: Right;     reports that she quit smoking about 23 years ago. Her smoking use included Cigarettes. She smoked 3.00 packs per day. She has never used smokeless tobacco. She reports that she drinks alcohol. She reports that she does not use illicit drugs.  Allergies  Allergen Reactions  . Gabapentin Itching  . Isosorbide Nitrate Itching  . Wellbutrin [Bupropion] Other (See Comments)    mental changes and increase anger  . Zonisamide Itching    Family History  Problem Relation Age of Onset  . Heart disease Mother   . Healthy Sister     Prior to Admission medications   Medication Sig Start Date End Date Taking? Authorizing Provider  aspirin-acetaminophen-caffeine (EXCEDRIN MIGRAINE) [phone] MG per tablet Take 2 tablets by mouth every 6 (six) hours as needed for headache.    Yes Historical  Provider, MD  carvedilol (COREG) 6.25 MG tablet TAKE 1 TABLET (6.25 MG TOTAL) BY MOUTH 2 (TWO) TIMES DAILY WITH A MEAL. 03/08/16  Yes Belva Crome, MD  cholecalciferol (VITAMIN D) 1000 UNITS tablet Take 2,000 Units by mouth daily.    Yes Historical Provider, MD  clonazePAM (KLONOPIN) 0.5 MG tablet Take 1 mg by mouth at bedtime. 1 tablet in the am and 2 tablets in the pm.   Yes Historical Provider, MD  doxepin (SINEQUAN) 25 MG capsule Take 50 mg by mouth at bedtime.   Yes Historical Provider, MD  DULoxetine (CYMBALTA) 60 MG capsule Take 120 mg by mouth daily.    Yes Historical Provider, MD  folic acid (FOLVITE) 1 MG tablet Take 1 mg by mouth daily.  01/17/14  Yes Historical Provider, MD    furosemide (LASIX) 20 MG tablet Take 1 tablet (20 mg total) by mouth daily. PLEASE CONTACT OFFICE FOR ADDITIONAL REFILLS 03/11/16  Yes Belva Crome, MD  glycopyrrolate (ROBINUL) 2 MG tablet Take 2 mg by mouth 2 (two) times daily. 02/19/16  Yes Historical Provider, MD  HUMIRA PEN 40 MG/0.8ML injection 1 injection every other week 01/22/14  Yes Historical Provider, MD  ibuprofen (ADVIL,MOTRIN) 200 MG tablet Take 400 mg by mouth every 6 (six) hours as needed for headache or moderate pain.   Yes Historical Provider, MD  leflunomide (ARAVA) 20 MG tablet Take 20 mg by mouth daily.   Yes Historical Provider, MD  levothyroxine (SYNTHROID, LEVOTHROID) 75 MCG tablet Take 75 mcg by mouth daily before breakfast.   Yes Historical Provider, MD  losartan (COZAAR) 25 MG tablet Take 1 tablet (25 mg total) by mouth daily. 03/13/15  Yes Belva Crome, MD  Multiple Vitamins-Minerals (ADULT GUMMY PO) Take 2 tablets by mouth daily.   Yes Historical Provider, MD  predniSONE (DELTASONE) 5 MG tablet Take 10-20 mg by mouth 2 (two) times daily. 10 mgs in the morning, 20mg s at noon daily 03/06/16  Yes Historical Provider, MD  Probiotic Product (PROBIOTIC DAILY PO) Take 2 tablets by mouth daily.    Yes Historical Provider, MD  rizatriptan (MAXALT) 10 MG tablet Take 10 mg by mouth daily as needed for migraine.  03/04/16  Yes Historical Provider, MD  simvastatin (ZOCOR) 10 MG tablet Take 10 mg by mouth daily.   Yes Historical Provider, MD  traMADol (ULTRAM) 50 MG tablet Take 50 mg by mouth. Take 50 mgs at lunch, 50mg s in the evening and 50mg s as needed for pian 01/16/15  Yes Historical Provider, MD  KLOR-CON M20 20 MEQ tablet TAKE 1 TABLET (20 MEQ TOTAL) BY MOUTH 2 (TWO) TIMES DAILY. Patient not taking: Reported on 03/11/2016 03/10/15   Belva Crome, MD    Physical Exam: Filed Vitals:   03/11/16 1508 03/11/16 1517  BP:  132/84  Pulse:  48  Temp:  98.4 F (36.9 C)  TempSrc:  Oral  Resp:  13  SpO2: 90% 98%       Filed  Vitals:   03/11/16 1508 03/11/16 1517  BP:  132/84  Pulse:  48  Temp:  98.4 F (36.9 C)  TempSrc:  Oral  Resp:  13  SpO2: 90% 98%  Constitutional: NAD, calm, comfortable , pale, ill appearing Eyes: PERRL, lids and conjunctivae normal ENMT: Mucous membranes are pale, Posterior pharynx clear of any exudate or lesions.Normal dentition.  Neck: normal, supple, no masses, no thyromegaly Respiratory: clear to auscultation bilaterally, no wheezing, no crackles. Normal respiratory effort. No accessory muscle  use.  Cardiovascular: Regular rate and rhythm, no murmurs / rubs / gallops. No extremity edema. 2+ pedal pulses. No carotid bruits.  Abdomen: no tenderness, no masses palpated. No hepatosplenomegaly. Bowel sounds positive.  Musculoskeletal: no clubbing / cyanosis. No joint deformity upper and lower extremities. Good ROM, no contractures. Normal muscle  Skin: bruises noted on legs Neurologic: CN 2-12 grossly intact. Sensation intact, DTR normal. Strength 5/5 in all 4.  Psychiatric: Normal judgment and insight. Alert and oriented x 3. Normal mood.    Labs on Admission: I have personally reviewed following labs and imaging studies  CBC:  Recent Labs Lab 03/11/16 1531  WBC 11.7*  NEUTROABS 8.7*  HGB 6.3*  HCT 20.2*  MCV 94.0  PLT [id number]   Basic Metabolic Panel:  Recent Labs Lab 03/11/16 1531 03/11/16 1603  NA 140 138  K 2.6* 2.6*  CL 105 104  CO2 26 26  GLUCOSE 172* 172*  BUN 39* 38*  CREATININE 0.97 1.01*  CALCIUM 7.5* 7.5*   GFR: CrCl cannot be calculated (Unknown ideal weight.). Liver Function Tests:  Recent Labs Lab 03/11/16 1603  AST 31  ALT 29  ALKPHOS 55  BILITOT 0.9  PROT 4.7*  ALBUMIN 2.9*   No results for input(s): LIPASE, AMYLASE in the last 168 hours. No results for input(s): AMMONIA in the last 168 hours. Coagulation Profile:  Recent Labs Lab 03/11/16 1603  INR 1.10   Cardiac Enzymes: No results for input(s): CKTOTAL, CKMB, CKMBINDEX,  TROPONINI in the last 168 hours. BNP (last 3 results) No results for input(s): PROBNP in the last 8760 hours. HbA1C: No results for input(s): HGBA1C in the last 72 hours. CBG:  Recent Labs Lab 03/11/16 1522  GLUCAP 148*   Lipid Profile: No results for input(s): CHOL, HDL, LDLCALC, TRIG, CHOLHDL, LDLDIRECT in the last 72 hours. Thyroid Function Tests: No results for input(s): TSH, T4TOTAL, FREET4, T3FREE, THYROIDAB in the last 72 hours. Anemia Panel: No results for input(s): VITAMINB12, FOLATE, FERRITIN, TIBC, IRON, RETICCTPCT in the last 72 hours. Urine analysis:    Component Value Date/Time   COLORURINE YELLOW 10/05/2013 1209   APPEARANCEUR CLEAR 10/05/2013 1209   LABSPEC 1.007 10/05/2013 1209   PHURINE 5.0 10/05/2013 1209   GLUCOSEU NEGATIVE 10/05/2013 1209   HGBUR NEGATIVE 10/05/2013 [place] 10/05/2013 [place] 10/05/2013 1209   PROTEINUR NEGATIVE 10/05/2013 1209   UROBILINOGEN 0.2 10/05/2013 1209   NITRITE NEGATIVE 10/05/2013 1209   LEUKOCYTESUR NEGATIVE 10/05/2013 1209   Sepsis Labs: @LABRCNTIP (procalcitonin:4,lacticidven:4) )No results found for this or any previous visit (from the past 240 hour(s)).   Radiological Exams on Admission: Dg Chest Portable 1 View  03/11/2016  CLINICAL DATA:  Decreased oral intake and weakness for 1 week EXAM: PORTABLE CHEST 1 VIEW COMPARISON:  10/05/2013 FINDINGS: Cardiac silhouette upper normal. Stable calcification and uncoiling of the aorta. Vascular pattern normal. Lungs clear. Left shoulder replacement noted. IMPRESSION: No active disease. Electronically Signed   By: Skipper Cliche M.D.   On: 03/11/2016 16:23    EKG: Independently reviewed. NSR< no acute ST t wave changes  Assessment/Plan Principal Problem:    Anemia/GI bleed -suspect upper GI bleed with melena, abd pain, anemia, and NSAID and prednisone use, possibly PUD -start IV PPI -[facility] consulted by EDP -Transfuse 2units  PRBC  H/o Collagenous colitis -on prednisone 20mg  daily and 10mg  daily per Dr.Hawkes for RA -will hold prednisone and stress dose steroids  RA -stress dose steroids, continue leflunamide  Chronic systolic CHF -EF [id number] -compensated, hold ARB, continue Coreg    Hypertension -BP was soft intially, stress dose steroids -hold cozaar  Hypokalemia -replace  DVT prophylaxis:SCDs Code Status: DNR Family Communication: sister at bedside Disposition Plan: Home pending workup Consults called: [facility] called by EDP Admission status: inpatient   Domenic Polite MD [facility] Pager [phone]  If 7PM-7AM, please contact night-coverage [email or web address] Password [facility]  03/11/2016, 5:45 PM

## 2016-03-11 NOTE — ED Notes (Signed)
Bed: RESA Expected date:  Expected time:  Means of arrival:  Comments: 87/hypotensive 80/30/tachy

## 2016-03-11 NOTE — ED Provider Notes (Signed)
CSN: [id number]     Arrival date & time 03/11/16  1458 History   First MD Initiated Contact with Patient 03/11/16 1520     Chief Complaint  Patient presents with  . Weakness   HPI Patient presents to the emergency room with weakness and near syncope.  Patient has been having trouble with poor appetite over the last several weeks. It got worse over the last several days. Today she became very weak and nearly fainted. Her family had to call EMS to bring her to the emergency room. Patient had one episode vomiting today. She has not noticed any blood. She did have a large loose stool over the last couple of days and it was very dark black in color. Patient does take ibuprofen. She has history of collagenous colitis and thought that her abdominal pain symptoms were attributed to that. She denies any chest pain. She denies any shortness of breath. She does get weak with exertion. Past Medical History  Diagnosis Date  . Rheumatoid arthritis(714.0) 12/16/2011  . Multinodular goiter (nontoxic) 12/16/2011  . Hypothyroidism (acquired) 12/16/2011  . Normochromic normocytic anemia 12/16/2011  . Anemia   . CHF (congestive heart failure) ([place])   . Psoriasis   . Depression   . UTI (lower urinary tract infection)   . Chronic systolic heart failure ([place])   . Anginal pain ([place])     long time ago  . Hypertension   . Hypercholesteremia     "doctor wants to get down to 100, now its 108"  . Anxiety   . Pneumonia     hx of, years ago  . Headache(784.0)     migraines  . History of kidney stones long time ago  . Cancer ([place])     right breast, 20 years ago  . Complication of anesthesia "long time ago"    one time woke up during surgery   . Colitis, collagenous    Past Surgical History  Procedure Laterality Date  . Mastectomy      right  . Carpal tunnel release Right   . Thyroid surgery    . Abdominal hysterectomy    . Left shoulder      "replaced joint with rod"  . Cholecystectomy    . Skin graft  Right     , Calf  . Appendectomy    . Breast surgery  20 years ago    reduced left breast, reconstruction  . Cataract extraction Bilateral   . Tonsillectomy    . Total hip arthroplasty Right 10/11/2013    Procedure: RIGHT TOTAL HIP ARTHROPLASTY ANTERIOR APPROACH;  Surgeon: Gearlean Alf, MD;  Location: [facility];  Service: Orthopedics;  Laterality: Right;   Family History  Problem Relation Age of Onset  . Heart disease Mother   . Healthy Sister    Social History  Substance Use Topics  . Smoking status: Former Smoker -- 3.00 packs/day    Types: Cigarettes    Quit date: 10/21/1992  . Smokeless tobacco: Never Used  . Alcohol Use: Yes     Comment: occasional   OB History    No data available     Review of Systems  All other systems reviewed and are negative.     Allergies  Gabapentin; Isosorbide nitrate; Wellbutrin; and Zonisamide  Home Medications   Prior to Admission medications   Medication Sig Start Date End Date Taking? Authorizing Provider  aspirin-acetaminophen-caffeine (EXCEDRIN MIGRAINE) [phone] MG per tablet Take 2 tablets by mouth every 6 (six)  hours as needed for headache.    Yes Historical Provider, MD  carvedilol (COREG) 6.25 MG tablet TAKE 1 TABLET (6.25 MG TOTAL) BY MOUTH 2 (TWO) TIMES DAILY WITH A MEAL. 03/08/16  Yes Belva Crome, MD  cholecalciferol (VITAMIN D) 1000 UNITS tablet Take 2,000 Units by mouth daily.    Yes Historical Provider, MD  clonazePAM (KLONOPIN) 0.5 MG tablet Take 1 mg by mouth at bedtime. 1 tablet in the am and 2 tablets in the pm.   Yes Historical Provider, MD  doxepin (SINEQUAN) 25 MG capsule Take 50 mg by mouth at bedtime.   Yes Historical Provider, MD  DULoxetine (CYMBALTA) 60 MG capsule Take 120 mg by mouth daily.    Yes Historical Provider, MD  folic acid (FOLVITE) 1 MG tablet Take 1 mg by mouth daily.  01/17/14  Yes Historical Provider, MD  furosemide (LASIX) 20 MG tablet Take 1 tablet (20 mg total) by mouth daily. PLEASE  CONTACT OFFICE FOR ADDITIONAL REFILLS 03/11/16  Yes Belva Crome, MD  glycopyrrolate (ROBINUL) 2 MG tablet Take 2 mg by mouth 2 (two) times daily. 02/19/16  Yes Historical Provider, MD  HUMIRA PEN 40 MG/0.8ML injection 1 injection every other week 01/22/14  Yes Historical Provider, MD  ibuprofen (ADVIL,MOTRIN) 200 MG tablet Take 400 mg by mouth every 6 (six) hours as needed for headache or moderate pain.   Yes Historical Provider, MD  leflunomide (ARAVA) 20 MG tablet Take 20 mg by mouth daily.   Yes Historical Provider, MD  levothyroxine (SYNTHROID, LEVOTHROID) 75 MCG tablet Take 75 mcg by mouth daily before breakfast.   Yes Historical Provider, MD  losartan (COZAAR) 25 MG tablet Take 1 tablet (25 mg total) by mouth daily. 03/13/15  Yes Belva Crome, MD  Multiple Vitamins-Minerals (ADULT GUMMY PO) Take 2 tablets by mouth daily.   Yes Historical Provider, MD  predniSONE (DELTASONE) 5 MG tablet Take 10-20 mg by mouth 2 (two) times daily. 10 mgs in the morning, 20mg s at noon daily 03/06/16  Yes Historical Provider, MD  Probiotic Product (PROBIOTIC DAILY PO) Take 2 tablets by mouth daily.    Yes Historical Provider, MD  rizatriptan (MAXALT) 10 MG tablet Take 10 mg by mouth daily as needed for migraine.  03/04/16  Yes Historical Provider, MD  simvastatin (ZOCOR) 10 MG tablet Take 10 mg by mouth daily.   Yes Historical Provider, MD  traMADol (ULTRAM) 50 MG tablet Take 50 mg by mouth. Take 50 mgs at lunch, 50mg s in the evening and 50mg s as needed for pian 01/16/15  Yes Historical Provider, MD  KLOR-CON M20 20 MEQ tablet TAKE 1 TABLET (20 MEQ TOTAL) BY MOUTH 2 (TWO) TIMES DAILY. Patient not taking: Reported on 03/11/2016 03/10/15   Belva Crome, MD   BP 132/84 mmHg  Pulse 48  Temp(Src) 98.4 F (36.9 C) (Oral)  Resp 13  SpO2 98% Physical Exam  Constitutional: No distress.  HENT:  Head: Normocephalic and atraumatic.  Right Ear: External ear normal.  Left Ear: External ear normal.  Eyes: Right eye exhibits  no discharge. Left eye exhibits no discharge. No scleral icterus.  Conjunctiva are pale  Neck: Neck supple. No tracheal deviation present.  Cardiovascular: Normal rate, regular rhythm and intact distal pulses.   Pulmonary/Chest: Effort normal and breath sounds normal. No stridor. No respiratory distress. She has no wheezes. She has no rales.  Abdominal: Soft. Bowel sounds are normal. She exhibits no distension. There is no tenderness. There is no rebound  and no guarding.  Genitourinary: Guaiac positive stool.  Musculoskeletal: She exhibits no edema or tenderness.  Neurological: She is alert. She has normal strength. No cranial nerve deficit (no facial droop, extraocular movements intact, no slurred speech) or sensory deficit. She exhibits normal muscle tone. She displays no seizure activity. Coordination normal.  Skin: Skin is warm and dry. No rash noted. She is not diaphoretic. There is pallor.  Psychiatric: She has a normal mood and affect.  Nursing note and vitals reviewed.   ED Course  Procedures (including critical care time)  CRITICAL CARE Performed by: [id number] Total critical care time: 35 minutes Critical care time was exclusive of separately billable procedures and treating other patients. Critical care was necessary to treat or prevent imminent or life-threatening deterioration. Critical care was time spent personally by me on the following activities: development of treatment plan with patient and/or surrogate as well as nursing, discussions with consultants, evaluation of patient's response to treatment, examination of patient, obtaining history from patient or surrogate, ordering and performing treatments and interventions, ordering and review of laboratory studies, ordering and review of radiographic studies, pulse oximetry and re-evaluation of patient's condition.  Labs Review Labs Reviewed  BASIC METABOLIC PANEL - Abnormal; Notable for the following:    Potassium 2.6 (*)     Glucose, Bld 172 (*)    BUN 39 (*)    Calcium 7.5 (*)    GFR calc non Af Amer 55 (*)    All other components within normal limits  CBC - Abnormal; Notable for the following:    WBC 11.7 (*)    RBC 2.15 (*)    Hemoglobin 6.3 (*)    HCT 20.2 (*)    RDW 18.0 (*)    All other components within normal limits  COMPREHENSIVE METABOLIC PANEL - Abnormal; Notable for the following:    Potassium 2.6 (*)    Glucose, Bld 172 (*)    BUN 38 (*)    Creatinine, Ser 1.01 (*)    Calcium 7.5 (*)    Total Protein 4.7 (*)    Albumin 2.9 (*)    GFR calc non Af Amer 52 (*)    All other components within normal limits  APTT - Abnormal; Notable for the following:    aPTT 21 (*)    All other components within normal limits  DIFFERENTIAL - Abnormal; Notable for the following:    Neutro Abs 8.7 (*)    All other components within normal limits  CBG MONITORING, ED - Abnormal; Notable for the following:    Glucose-Capillary 148 (*)    All other components within normal limits  POC OCCULT BLOOD, ED - Abnormal; Notable for the following:    Fecal Occult Bld POSITIVE (*)    All other components within normal limits  I-STAT TROPOININ, ED - Abnormal; Notable for the following:    Troponin i, poc 0.16 (*)    All other components within normal limits  PROTIME-INR  BRAIN NATRIURETIC PEPTIDE  URINALYSIS, ROUTINE W REFLEX MICROSCOPIC (NOT AT [facility])  CBC WITH DIFFERENTIAL/PLATELET  TYPE AND SCREEN  TYPE AND SCREEN  PREPARE RBC (CROSSMATCH)    Imaging Review Dg Chest Portable 1 View  03/11/2016  CLINICAL DATA:  Decreased oral intake and weakness for 1 week EXAM: PORTABLE CHEST 1 VIEW COMPARISON:  10/05/2013 FINDINGS: Cardiac silhouette upper normal. Stable calcification and uncoiling of the aorta. Vascular pattern normal. Lungs clear. Left shoulder replacement noted. IMPRESSION: No active disease. Electronically Signed   By:  Skipper Cliche M.D.   On: 03/11/2016 16:23   I have personally reviewed and  evaluated these images and lab results as part of my medical decision-making.   EKG Interpretation   Date/Time:  Monday Mar 11 2016 15:13:53 EDT Ventricular Rate:  108 PR Interval:  173 QRS Duration: 120 QT Interval:  365 QTC Calculation: 489 R Axis:   -59 Text Interpretation:  Sinus tachycardia Incomplete left bundle branch  block LVH with secondary repolarization abnormality Anterior Q waves,  possibly due to LVH Since last tracing rate faster , incomplete bundle  branch block is new Confirmed by Steele Ledonne  MD-J, Sahiba Granholm [id number]) on 03/11/2016  4:13:01 PM      MDM   Final diagnoses:  Gastrointestinal hemorrhage, unspecified gastritis, unspecified gastrointestinal hemorrhage type   Patient presents to the emergency room with near-syncope. Her laboratory tests are notable for a hemoglobin of 6.3. This is a drop in her baseline hemoglobin.   Patient has guaiac positive stools. I suspect her symptoms are related to an upper GI bleed.  Plan on blood transfusions. I've spoken with Dr. Penelope Coop from [facility] gastroenterology. He will consult on the patient. I spoke with Dr. Broadus John will be admitting the patient to the hospital.  The patient's hypokalemic. IV potassium has been ordered. Patient also has a slight increase in the troponin. This could be related to cardiac strain associated with the anemia. We will continue monitor for cardiac issues.    Dorie Rank, MD 03/11/16 [id number]

## 2016-03-11 NOTE — ED Notes (Signed)
Critical Istat result given to EDP Tomi Bamberger and RN

## 2016-03-11 NOTE — Telephone Encounter (Signed)
Rx(s) sent to pharmacy electronically.  

## 2016-03-11 NOTE — ED Notes (Signed)
Potassium 2.6 from [place] in lab.

## 2016-03-11 NOTE — ED Notes (Signed)
Per EMS pt failure to thrive over past week related to weakness and decrease oral intake. Pt reports acute chronic lower abdominal pain related to colitis. Oxygen saturation low 90s with EMS arrival. Pt appears pale.

## 2016-03-11 NOTE — ED Notes (Signed)
17:38 pt can go to floor.

## 2016-03-11 NOTE — ED Notes (Signed)
MD at bedside. 

## 2016-03-12 ENCOUNTER — Inpatient Hospital Stay (HOSPITAL_COMMUNITY): Payer: Medicare Other | Admitting: Anesthesiology

## 2016-03-12 ENCOUNTER — Encounter (HOSPITAL_COMMUNITY): Payer: Self-pay | Admitting: *Deleted

## 2016-03-12 ENCOUNTER — Encounter (HOSPITAL_COMMUNITY)
Admission: EM | Disposition: A | Discharge status: Skilled Nursing Facility | Payer: Self-pay | Source: Home / Self Care | Attending: Internal Medicine

## 2016-03-12 HISTORY — PX: ESOPHAGOGASTRODUODENOSCOPY (EGD) WITH PROPOFOL: SHX5813

## 2016-03-12 LAB — CBC
HCT: 27.4 % — ABNORMAL LOW (ref 36.0–46.0)
HCT: 28.6 % — ABNORMAL LOW (ref 36.0–46.0)
HEMOGLOBIN: 9 g/dL — AB (ref 12.0–15.0)
Hemoglobin: 9.5 g/dL — ABNORMAL LOW (ref 12.0–15.0)
MCH: 29.9 pg (ref 26.0–34.0)
MCH: 29.5 pg (ref 26.0–34.0)
MCHC: 32.8 g/dL (ref 30.0–36.0)
MCHC: 33.2 g/dL (ref 30.0–36.0)
MCV: 91 fL (ref 78.0–100.0)
MCV: 88.8 fL (ref 78.0–100.0)
PLATELETS: 129 10*3/uL — AB (ref 150–400)
Platelets: 125 10*3/uL — ABNORMAL LOW (ref 150–400)
RBC: 3.01 MIL/uL — ABNORMAL LOW (ref 3.87–5.11)
RBC: 3.22 MIL/uL — ABNORMAL LOW (ref 3.87–5.11)
RDW: 16.2 % — ABNORMAL HIGH (ref 11.5–15.5)
RDW: 16.7 % — AB (ref 11.5–15.5)
WBC: 8.7 10*3/uL (ref 4.0–10.5)
WBC: 13 10*3/uL — ABNORMAL HIGH (ref 4.0–10.5)

## 2016-03-12 LAB — BASIC METABOLIC PANEL
Anion gap: 4 — ABNORMAL LOW (ref 5–15)
BUN: 29 mg/dL — AB (ref 6–20)
CHLORIDE: 111 mmol/L (ref 101–111)
CO2: 26 mmol/L (ref 22–32)
CREATININE: 0.95 mg/dL (ref 0.44–1.00)
Calcium: 7.2 mg/dL — ABNORMAL LOW (ref 8.9–10.3)
GFR calc Af Amer: >60 mL/min (ref >60)
GFR calc non Af Amer: 56 mL/min — ABNORMAL LOW (ref >60)
GLUCOSE: 118 mg/dL — AB (ref 65–99)
Potassium: 3.7 mmol/L (ref 3.5–5.1)
SODIUM: 141 mmol/L (ref 135–145)

## 2016-03-12 SURGERY — ESOPHAGOGASTRODUODENOSCOPY (EGD) WITH PROPOFOL
Anesthesia: Monitor Anesthesia Care

## 2016-03-12 MED ORDER — SODIUM CHLORIDE 0.9 % IV SOLN
8.0000 mg/h | INTRAVENOUS | Status: AC
  Administered 2016-03-12 – 2016-03-15 (×6): 8 mg/h via INTRAVENOUS
  Filled 2016-03-12 (×14): qty 80

## 2016-03-12 MED ORDER — PANTOPRAZOLE SODIUM 40 MG IV SOLR: 40.0000 mg | Freq: Two times a day (BID) | INTRAVENOUS | Status: DC

## 2016-03-12 MED ORDER — PROPOFOL 500 MG/50ML IV EMUL
INTRAVENOUS | Status: DC | PRN
  Administered 2016-03-12: 125 ug/kg/min via INTRAVENOUS

## 2016-03-12 MED ORDER — EPINEPHRINE HCL 0.1 MG/ML IJ SOSY
PREFILLED_SYRINGE | INTRAMUSCULAR | Status: AC
  Filled 2016-03-12: qty 10

## 2016-03-12 MED ORDER — PROPOFOL 10 MG/ML IV BOLUS
INTRAVENOUS | Status: AC
Start: 2016-03-12 — End: 2016-03-12
  Filled 2016-03-12: qty 40

## 2016-03-12 MED ORDER — PROPOFOL 10 MG/ML IV BOLUS
INTRAVENOUS | Status: AC
Start: 2016-03-12 — End: 2016-03-12
  Filled 2016-03-12: qty 20

## 2016-03-12 MED ORDER — LOSARTAN POTASSIUM 50 MG PO TABS
50.0000 mg | ORAL_TABLET | Freq: Every day | ORAL | Status: DC
Start: 2016-03-12 — End: 2016-03-13
  Administered 2016-03-12 – 2016-03-13 (×2): 50 mg via ORAL
  Filled 2016-03-12 (×2): qty 1

## 2016-03-12 MED ORDER — PROCHLORPERAZINE EDISYLATE 5 MG/ML IJ SOLN
5.0000 mg | Freq: Four times a day (QID) | INTRAMUSCULAR | Status: DC | PRN
  Administered 2016-03-12: 5 mg via INTRAVENOUS
  Filled 2016-03-12: qty 2

## 2016-03-12 MED ORDER — SODIUM CHLORIDE 0.9 % IV SOLN: INTRAVENOUS | Status: DC

## 2016-03-12 MED ORDER — LIDOCAINE HCL (CARDIAC) 20 MG/ML IV SOLN
INTRAVENOUS | Status: DC | PRN
  Administered 2016-03-12: 50 mg via INTRAVENOUS

## 2016-03-12 MED ORDER — SODIUM CHLORIDE 0.9 % IJ SOLN
PREFILLED_SYRINGE | INTRAMUSCULAR | Status: DC | PRN
  Administered 2016-03-12: 7 mL

## 2016-03-12 MED ORDER — HYDROCORTISONE NA SUCCINATE PF 100 MG IJ SOLR
50.0000 mg | Freq: Two times a day (BID) | INTRAMUSCULAR | Status: DC
  Administered 2016-03-12 – 2016-03-13 (×2): 50 mg via INTRAVENOUS
  Filled 2016-03-12 (×3): qty 2

## 2016-03-12 MED ORDER — LIDOCAINE HCL (CARDIAC) 20 MG/ML IV SOLN
INTRAVENOUS | Status: AC
  Filled 2016-03-12: qty 5

## 2016-03-12 SURGICAL SUPPLY — 14 items

## 2016-03-12 NOTE — Progress Notes (Signed)
Initial Nutrition Assessment  DOCUMENTATION CODES:   Not applicable  INTERVENTION:  -Once diet is advanced, order Prostat BID. Each supplement provides 100 kcals and 15 grams of protein.  -Discontinue Boost Breeze per pt request.   NUTRITION DIAGNOSIS:   Inadequate oral intake related to poor appetite as evidenced by per patient/family report.  GOAL:   Patient will meet greater than or equal to 90% of their needs  MONITOR:   PO intake, Supplement acceptance, Labs, Weight trends, Skin, I & O's  REASON FOR ASSESSMENT:   Low Braden    ASSESSMENT:   Pt a history of collagenous colitis which seems to be under control at this time with prednisone. She also has been put on dicyclomine recently for abdominal cramping. She has other medical problems including congestive heart failure and rheumatoid arthritis. For the past 5 or 6 days she has been feeling very weak and rundown. 5 days ago she had a black tarry stool. Because of ongoing weakness she finally came to the emergency department to be evaluated. She also collapsed today at home but did not lose consciousness. She was found to have a hemoglobin of 6.3 and her stool was heme positive. She takes ibuprofen on a regular basis for headaches. She did vomit but did not vomit any blood.   Pt reports eating BID. She reports eating foods including 1/2 a sandwich, chicken noodle soup, and fruit. Pt reports not eating well and having a poor appetite since her husband passed away in 11-22-22. Denies N/V but reports abdominal pain over the past week.   Pt does not know UBW. Per chart, she weighs 149 lbs. No recent weight hx found in chart. Pt denies weight loss.   She does not take nutritional supplements at home d/t dislike of taste. Intern discussed importance of adequate protein and pt was amenable to trying prostat. Will order BID once diet is advanced.     NFPE: No muscle depletion, no fat depletion, no edema.  Labs reviewed; BUN 29, Ca  7.2, GFR 56. Meds reviewed; vitamin D, folic acid.    Diet Order:  Diet NPO time specified  Skin:  Reviewed, no issues  Last BM:  5/18  Height:   Ht Readings from Last 1 Encounters:  03/11/16 5\' 2"  (1.575 m)    Weight:   Wt Readings from Last 1 Encounters:  03/11/16 149 lb 11.1 oz (67.9 kg)    Ideal Body Weight:  50 kg  BMI:  Body mass index is 27.37 kg/(m^2).  Estimated Nutritional Needs:   Kcal:  1350-1550  Protein:  70-80 g  Fluid:  1.5L  EDUCATION NEEDS:   No education needs identified at this time  Geoffery Lyons, [place] Dietetic Intern Pager [phone]

## 2016-03-12 NOTE — Progress Notes (Addendum)
PROGRESS NOTE    Olivia Mullen  [id number] DOB: [birth year] DOA: 03/11/2016 PCP: [name], MD  Brief Narrative: Olivia Mullen is a 80 y.o. female with medical history significant of Chronic systolic CHF, Collagenous colitis on prednisone (20mg ) and RA on prednisone (10mg ), HTN, presents to the ER due to weakness. History provided by sister who reports increasing abd pain since Wednesday, she had melena on thursday-described as black and tarry.she has been taking glycopyrrolate for this. She was getting weaker, and eventually fell down and collapsed 5/22, no LoC, when her friend was helping her and brought to the ER. In ER, found to have Hb of 6.3 down from 8-5-9 range, She takes Ibuprofen everyday for headaches too  Assessment & Plan:  Anemia/GI bleed -suspect upper GI bleed with melena, abd pain, anemia, and NSAID and prednisone use, possibly PUD -continue IV PPI -[facility] consulted  -Transfused 2units PRBC -for EGD today -will transfuse 1unit PRBC  H/o Collagenous colitis -on prednisone 20mg  daily per Dr.Edwards and 10mg  daily per Dr.Hawkes for RA -on stress dose steroids, will cut down dose -resume prednisone tomorrow  RA -stress dose steroids, continue leflunamide  Chronic systolic CHF  -EF [id number] -compensated, holding ARB, continue Coreg -lasix after blood today   Hypertension -BP was soft intially, stress dose steroids -now high, resume cozaar  Hypokalemia -replace  DVT prophylaxis:SCDs Code Status: DNR Family Communication: sister at bedside Disposition Plan: Home pending workup   Consultants:   [facility]  Procedures:   Subjective: Feels better after blood, breathing ok  Objective: Filed Vitals:   03/12/16 1500 03/12/16 1510 03/12/16 1520 03/12/16 1550  BP: 198/176 170/53 156/61 111/91  Pulse: 77 57 58 60  Temp:    98.2 F (36.8 C)  TempSrc:    Oral  Resp: 22 12 19 20   Height:      Weight:      SpO2: 98% 97% 93% 95%     Intake/Output Summary (Last 24 hours) at 03/12/16 1557 Last data filed at 03/12/16 1441  Gross per 24 hour  Intake   1346 ml  Output      0 ml  Net   1346 ml   Filed Weights   03/11/16 1823  Weight: 67.9 kg (149 lb 11.1 oz)    Examination:  General exam: Appears calm and comfortable, no distress, AAOx3 Respiratory system: Clear to auscultation. Respiratory effort normal. Cardiovascular system: S1 & S2 heard, RRR. No JVD, murmurs, rubs, gallops or clicks. trace edema. Gastrointestinal system: Abdomen is nondistended, soft and nontender. No organomegaly or masses felt. Normal bowel sounds. Central nervous system: Alert and oriented. No focal neurological deficits. Extremities: Symmetric 5 x 5 power. Skin: No rashes, lesions or ulcers Psychiatry: Judgement and insight appear normal. Mood & affect appropriate.     Data Reviewed: I have personally reviewed following labs and imaging studies  CBC:  Recent Labs Lab 03/11/16 1531 03/12/16 0844  WBC 11.7* 8.7  NEUTROABS 8.7*  --   HGB 6.3* 9.0*  HCT 20.2* 27.4*  MCV 94.0 91.0  PLT 171 0000000*   Basic Metabolic Panel:  Recent Labs Lab 03/11/16 1531 03/11/16 1603 03/12/16 0844  NA 140 138 141  K 2.6* 2.6* 3.7  CL 105 104 111  CO2 26 26 26   GLUCOSE 172* 172* 118*  BUN 39* 38* 29*  CREATININE 0.97 1.01* 0.95  CALCIUM 7.5* 7.5* 7.2*   GFR: Estimated Creatinine Clearance: 44.1 mL/min (by C-G formula based on Cr of 0.95). Liver  Function Tests:  Recent Labs Lab 03/11/16 1603  AST 31  ALT 29  ALKPHOS 55  BILITOT 0.9  PROT 4.7*  ALBUMIN 2.9*   No results for input(s): LIPASE, AMYLASE in the last 168 hours. No results for input(s): AMMONIA in the last 168 hours. Coagulation Profile:  Recent Labs Lab 03/11/16 1603  INR 1.10   Cardiac Enzymes: No results for input(s): CKTOTAL, CKMB, CKMBINDEX, TROPONINI in the last 168 hours. BNP (last 3 results) No results for input(s): PROBNP in the last 8760  hours. HbA1C: No results for input(s): HGBA1C in the last 72 hours. CBG:  Recent Labs Lab 03/11/16 1522  GLUCAP 148*   Lipid Profile: No results for input(s): CHOL, HDL, LDLCALC, TRIG, CHOLHDL, LDLDIRECT in the last 72 hours. Thyroid Function Tests: No results for input(s): TSH, T4TOTAL, FREET4, T3FREE, THYROIDAB in the last 72 hours. Anemia Panel: No results for input(s): VITAMINB12, FOLATE, FERRITIN, TIBC, IRON, RETICCTPCT in the last 72 hours. Urine analysis:    Component Value Date/Time   COLORURINE YELLOW 10/05/2013 1209   APPEARANCEUR CLEAR 10/05/2013 1209   LABSPEC 1.007 10/05/2013 1209   PHURINE 5.0 10/05/2013 1209   GLUCOSEU NEGATIVE 10/05/2013 1209   HGBUR NEGATIVE 10/05/2013 [place] 10/05/2013 [place] 10/05/2013 1209   PROTEINUR NEGATIVE 10/05/2013 1209   UROBILINOGEN 0.2 10/05/2013 1209   NITRITE NEGATIVE 10/05/2013 1209   LEUKOCYTESUR NEGATIVE 10/05/2013 1209   Sepsis Labs: @LABRCNTIP (procalcitonin:4,lacticidven:4)  )No results found for this or any previous visit (from the past 240 hour(s)).       Radiology Studies: Dg Chest Portable 1 View  03/11/2016  CLINICAL DATA:  Decreased oral intake and weakness for 1 week EXAM: PORTABLE CHEST 1 VIEW COMPARISON:  10/05/2013 FINDINGS: Cardiac silhouette upper normal. Stable calcification and uncoiling of the aorta. Vascular pattern normal. Lungs clear. Left shoulder replacement noted. IMPRESSION: No active disease. Electronically Signed   By: Skipper Cliche M.D.   On: 03/11/2016 16:23        Scheduled Meds: . sodium chloride   Intravenous Once  . acidophilus  1 capsule Oral Daily  . carvedilol  6.25 mg Oral BID WC  . cholecalciferol  2,000 Units Oral Daily  . clonazePAM  1 mg Oral QHS  . doxepin  50 mg Oral QHS  . DULoxetine  120 mg Oral Daily  . folic acid  1 mg Oral Daily  . glycopyrrolate  2 mg Oral BID  . hydrocortisone sod succinate (SOLU-CORTEF) inj  50 mg  Intravenous Q8H  . leflunomide  20 mg Oral Daily  . levothyroxine  75 mcg Oral QAC breakfast  . [START ON 03/16/2016] pantoprazole (PROTONIX) IV  40 mg Intravenous Q12H  . simvastatin  10 mg Oral q1800   Continuous Infusions: . sodium chloride    . pantoprozole (PROTONIX) infusion       LOS: 1 day    Time spent: 63min    Domenic Polite, MD [facility] Pager [phone]  If 7PM-7AM, please contact night-coverage [email or web address] Password [facility] 03/12/2016, 3:57 PM

## 2016-03-12 NOTE — Interval H&P Note (Signed)
History and Physical Interval Note:  03/12/2016 2:09 PM  Colony Lions  has presented today for surgery, with the diagnosis of melena, anemia  The various methods of treatment have been discussed with the patient and family. After consideration of risks, benefits and other options for treatment, the patient has consented to  Procedure(s): ESOPHAGOGASTRODUODENOSCOPY (EGD) WITH PROPOFOL (N/A) as a surgical intervention .  The patient's history has been reviewed, patient examined, no change in status, stable for surgery.  I have reviewed the patient's chart and labs.  Questions were answered to the patient's satisfaction.     [place] C.

## 2016-03-12 NOTE — Op Note (Signed)
[facility] Patient Name: Olivia Mullen Procedure Date: 03/12/2016 MRN: [id number] Attending MD: Lear Ng , MD Date of Birth: 09/15/1936 CSN: [id number] Age: 80 Admit Type: Inpatient Procedure:                Upper GI endoscopy Indications:              Heme positive stool, Melena Providers:                Lear Ng, MD, Vista Lawman, RN, Tory Emerald, RN, William Dalton, Technician Referring MD:              Medicines:                Propofol per Anesthesia, Monitored Anesthesia Care Complications:            No immediate complications. Estimated Blood Loss:     Estimated blood loss: none. Procedure:                Pre-Anesthesia Assessment:                           - Prior to the procedure, a History and Physical                            was performed, and patient medications and                            allergies were reviewed. The patient's tolerance of                            previous anesthesia was also reviewed. The risks                            and benefits of the procedure and the sedation                            options and risks were discussed with the patient.                            All questions were answered, and informed consent                            was obtained. Prior Anticoagulants: The patient has                            taken no previous anticoagulant or antiplatelet                            agents. ASA Grade Assessment: III - A patient with                            severe systemic disease. After reviewing the risks  and benefits, the patient was deemed in                            satisfactory condition to undergo the procedure.                           After obtaining informed consent, the endoscope was                            passed under direct vision. Throughout the                            procedure, the patient's blood pressure,  pulse, and                            oxygen saturations were monitored continuously. The                            was introduced through the mouth, and advanced to                            the second part of duodenum. The upper GI endoscopy                            was accomplished without difficulty. The patient                            tolerated the procedure well. Scope In: Scope Out: Findings:      The oropharynx was normal.      LA Grade C (one or more mucosal breaks continuous between tops of 2 or       more mucosal folds, less than 75% circumference) esophagitis with no       bleeding was found in the lower third of the esophagus.      Patchy candidiasis was found in the upper third of the esophagus and in       the middle third of the esophagus.      The Z-line was found 42 cm from the incisors.      One non-bleeding cratered gastric ulcer with a visible vessel was found       in the prepyloric region of the stomach. The lesion was 20 mm in largest       dimension. Area was successfully injected with 7 mL of a 1:10,000       solution of epinephrine for hemostasis. Coagulation for hemostasis using       7 French bicap gold heater probe was successful. Estimated blood loss:       none.      Few non-bleeding superficial gastric ulcers with no stigmata of bleeding       were found in the gastric antrum. The largest lesion was 5 mm in largest       dimension.      Segmental moderate mucosal changes characterized by congestion were       found in the gastric antrum (adjacent to the distal gastric ulcer).       Biopsies were taken with a cold forceps for histology. Estimated blood  loss was minimal.      The cardia and gastric fundus were normal on retroflexion.      The examined duodenum was normal. Impression:               - Normal oropharynx.                           - LA Grade C reflux esophagitis.                           - Monilial esophagitis.                            - Z-line, 42 cm from the incisors.                           - Non-bleeding gastric ulcer with a visible vessel.                            Injected. Treated with a heater probe.                           - Non-bleeding gastric ulcers with no stigmata of                            bleeding.                           - Congested mucosa in the antrum. Biopsied.                           - Normal examined duodenum. Moderate Sedation:      N/A- Per Anesthesia Care Recommendation:           - Give Protonix (pantoprazole): 8 mg/hr IV by                            continuous infusion.                           - NPO (except ice chips).                           - Await pathology results.                           - Post procedure medication orders were given. Procedure Code(s):        --- Professional ---                           [id number], 48, Esophagogastroduodenoscopy, flexible,                            transoral; with control of bleeding, any method Diagnosis Code(s):        --- Professional ---                           K21.0,  Gastro-esophageal reflux disease with                            esophagitis                           K25.9, Gastric ulcer, unspecified as acute or                            chronic, without hemorrhage or perforation                           K92.1, Melena (includes Hematochezia)                           R19.5, Other fecal abnormalities                           B37.81, Candidal esophagitis                           K25.4, Chronic or unspecified gastric ulcer with                            hemorrhage                           K31.89, Other diseases of stomach and duodenum CPT copyright 2016 [employer]. All rights reserved. The codes documented in this report are preliminary and upon coder review may  be revised to meet current compliance requirements. [name], MD Lear Ng, MD 03/12/2016 3:08:49 PM This report has  been signed electronically. Number of Addenda: 0

## 2016-03-12 NOTE — Transfer of Care (Signed)
Immediate Anesthesia Transfer of Care Note  Patient: Olivia Mullen  Procedure(s) Performed: Procedure(s): ESOPHAGOGASTRODUODENOSCOPY (EGD) WITH PROPOFOL (N/A)  Patient Location: PACU  Anesthesia Type:MAC  Level of Consciousness:  sedated, patient cooperative and responds to stimulation  Airway & Oxygen Therapy:Patient Spontanous Breathing and Patient connected to face mask oxgen  Post-op Assessment:  Report given to PACU RN and Post -op Vital signs reviewed and stable  Post vital signs:  Reviewed and stable  Last Vitals:  Filed Vitals:   03/12/16 0648 03/12/16 1316  BP: 137/52 150/51  Pulse: 70 69  Temp: 36.7 C 36.6 C  Resp: 16 21    Complications: No apparent anesthesia complications

## 2016-03-12 NOTE — Brief Op Note (Signed)
Large cratered distal stomach ulcer with a non-bleeding visible vessel in the base that was cauterized and ulcer was injected with epinephrine:saline mixture. Changed to Protonix drip. NPO except ice chips.

## 2016-03-12 NOTE — Consult Note (Signed)
   [facility] CM Inpatient Consult   03/12/2016  Olivia Mullen August 29, 1936 [id number]   [facility] Care Management referral received. Patient currently off unit. Will come back at a later time to discuss and offer [place] Management program services.    Marthenia Rolling, MSN-Ed, RN,BSN [facility] Liaison [phone]

## 2016-03-12 NOTE — Anesthesia Postprocedure Evaluation (Signed)
Anesthesia Post Note  Patient: Olivia Mullen  Procedure(s) Performed: Procedure(s) (LRB): ESOPHAGOGASTRODUODENOSCOPY (EGD) WITH PROPOFOL (N/A)  Patient location during evaluation: PACU Anesthesia Type: MAC Level of consciousness: awake and alert Pain management: pain level controlled Vital Signs Assessment: post-procedure vital signs reviewed and stable Respiratory status: spontaneous breathing, nonlabored ventilation, respiratory function stable and patient connected to nasal cannula oxygen Cardiovascular status: stable and blood pressure returned to baseline Anesthetic complications: no    Last Vitals:  Filed Vitals:   03/12/16 1520 03/12/16 1550  BP: 156/61 111/91  Pulse: 58 60  Temp:  36.8 C  Resp: 19 20    Last Pain:  Filed Vitals:   03/12/16 1855  PainSc: 0-No pain                 Lemya Greenwell J

## 2016-03-12 NOTE — Anesthesia Preprocedure Evaluation (Addendum)
Anesthesia Evaluation  Patient identified by MRN, date of birth, ID band Patient awake    Reviewed: Allergy & Precautions, NPO status , Patient's Chart, lab work & pertinent test results  History of Anesthesia Complications (+) history of anesthetic complications  Airway Mallampati: II  TM Distance: >3 FB Neck ROM: Full    Dental no notable dental hx.    Pulmonary pneumonia, former smoker,    Pulmonary exam normal breath sounds clear to auscultation       Cardiovascular hypertension, Pt. on medications and Pt. on home beta blockers + angina +CHF  Normal cardiovascular exam Rhythm:Regular Rate:Normal  Study Conclusions  - Left ventricle: The cavity size was normal. There was mild  concentric hypertrophy with severe basal septal wall hypertrophy.  Systolic function was normal. The estimated ejection fraction was  in the range of 50% to 55%. Wall motion was normal; there were no  regional wall motion abnormalities. - Mitral valve: Calcified annulus. There was mild regurgitation.  Impressions:  - When compared to 2013, EF has significantly improved.   Neuro/Psych  Headaches, PSYCHIATRIC DISORDERS Anxiety Depression    GI/Hepatic negative GI ROS, Neg liver ROS,   Endo/Other  diabetes, Type 2, Insulin Dependent, Oral Hypoglycemic AgentsHypothyroidism   Renal/GU negative Renal ROS  negative genitourinary   Musculoskeletal  (+) Arthritis ,   Abdominal   Peds negative pediatric ROS (+)  Hematology  (+) anemia ,   Anesthesia Other Findings   Reproductive/Obstetrics negative OB ROS                           Anesthesia Physical Anesthesia Plan  ASA: III  Anesthesia Plan: MAC   Post-op Pain Management:    Induction: Intravenous  Airway Management Planned: Natural Airway  Additional Equipment:   Intra-op Plan:   Post-operative Plan:   Informed Consent: I have reviewed the  patients History and Physical, chart, labs and discussed the procedure including the risks, benefits and alternatives for the proposed anesthesia with the patient or authorized representative who has indicated his/her understanding and acceptance.   Dental advisory given  Plan Discussed with: CRNA  Anesthesia Plan Comments:       Anesthesia Quick Evaluation

## 2016-03-12 NOTE — H&P (View-Only) (Signed)
Subjective:   HPI  The patient is a 80 year old female whose primary gastroenterologist is Dr. Laurence Spates. She has a history of collagenous colitis which seems to be under control at this time with prednisone. She also has been put on dicyclomine recently for abdominal cramping. She has other medical problems including congestive heart failure and rheumatoid arthritis. For the past 5 or 6 days she has been feeling very weak and rundown. 5 days ago she had a black tarry stool. Because of ongoing weakness she finally came to the emergency department to be evaluated. She also collapsed today at home but did not lose consciousness. She was found to have a hemoglobin of 6.3 and her stool was heme positive. She takes ibuprofen on a regular basis for headaches. She did vomit but did not vomit any blood.  Review of Systems She denies chest pain she is feeling very weak and rundown  Past Medical History  Diagnosis Date  . Rheumatoid arthritis(714.0) 12/16/2011  . Multinodular goiter (nontoxic) 12/16/2011  . Hypothyroidism (acquired) 12/16/2011  . Normochromic normocytic anemia 12/16/2011  . Anemia   . CHF (congestive heart failure) ([place])   . Psoriasis   . Depression   . UTI (lower urinary tract infection)   . Chronic systolic heart failure ([place])   . Anginal pain ([place])     long time ago  . Hypertension   . Hypercholesteremia     "doctor wants to get down to 100, now its 108"  . Anxiety   . Pneumonia     hx of, years ago  . Headache(784.0)     migraines  . History of kidney stones long time ago  . Cancer ([place])     right breast, 20 years ago  . Complication of anesthesia "long time ago"    one time woke up during surgery   . Colitis, collagenous    Past Surgical History  Procedure Laterality Date  . Mastectomy      right  . Carpal tunnel release Right   . Thyroid surgery    . Abdominal hysterectomy    . Left shoulder      "replaced joint with rod"  . Cholecystectomy    . Skin graft  Right     , Calf  . Appendectomy    . Breast surgery  20 years ago    reduced left breast, reconstruction  . Cataract extraction Bilateral   . Tonsillectomy    . Total hip arthroplasty Right 10/11/2013    Procedure: RIGHT TOTAL HIP ARTHROPLASTY ANTERIOR APPROACH;  Surgeon: Gearlean Alf, MD;  Location: [facility];  Service: Orthopedics;  Laterality: Right;   Social History   Social History  . Marital Status: Married    Spouse Name: N/A  . Number of Children: N/A  . Years of Education: N/A   Occupational History  . Not on file.   Social History Main Topics  . Smoking status: Former Smoker -- 3.00 packs/day    Types: Cigarettes    Quit date: 10/21/1992  . Smokeless tobacco: Never Used  . Alcohol Use: Yes     Comment: occasional  . Drug Use: No  . Sexual Activity: No   Other Topics Concern  . Not on file   Social History Narrative   family history includes Healthy in her sister; Heart disease in her mother.  Current facility-administered medications:  .  0.9 %  sodium chloride infusion, , Intravenous, Continuous, Dorie Rank, MD, Last Rate: 125 mL/hr at 03/11/16  1630 .  0.9 %  sodium chloride infusion, , Intravenous, Once, Dorie Rank, MD .  potassium chloride 10 mEq in 100 mL IVPB, 10 mEq, Intravenous, Once, Dorie Rank, MD .  potassium chloride SA (K-DUR,KLOR-CON) CR tablet 40 mEq, 40 mEq, Oral, Q2H, Domenic Polite, MD  Current outpatient prescriptions:  .  aspirin-acetaminophen-caffeine (EXCEDRIN MIGRAINE) 250-500-2848 MG per tablet, Take 2 tablets by mouth every 6 (six) hours as needed for headache. , Disp: , Rfl:  .  carvedilol (COREG) 6.25 MG tablet, TAKE 1 TABLET (6.25 MG TOTAL) BY MOUTH 2 (TWO) TIMES DAILY WITH A MEAL., Disp: 60 tablet, Rfl: 10 .  cholecalciferol (VITAMIN D) 1000 UNITS tablet, Take 2,000 Units by mouth daily. , Disp: , Rfl:  .  clonazePAM (KLONOPIN) 0.5 MG tablet, Take 1 mg by mouth at bedtime. 1 tablet in the am and 2 tablets in the pm., Disp: , Rfl:  .   doxepin (SINEQUAN) 25 MG capsule, Take 50 mg by mouth at bedtime., Disp: , Rfl:  .  DULoxetine (CYMBALTA) 60 MG capsule, Take 120 mg by mouth daily. , Disp: , Rfl:  .  folic acid (FOLVITE) 1 MG tablet, Take 1 mg by mouth daily. , Disp: , Rfl:  .  furosemide (LASIX) 20 MG tablet, Take 1 tablet (20 mg total) by mouth daily. PLEASE CONTACT OFFICE FOR ADDITIONAL REFILLS, Disp: 30 tablet, Rfl: 1 .  glycopyrrolate (ROBINUL) 2 MG tablet, Take 2 mg by mouth 2 (two) times daily., Disp: , Rfl: 6 .  HUMIRA PEN 40 MG/0.8ML injection, 1 injection every other week, Disp: , Rfl:  .  ibuprofen (ADVIL,MOTRIN) 200 MG tablet, Take 400 mg by mouth every 6 (six) hours as needed for headache or moderate pain., Disp: , Rfl:  .  leflunomide (ARAVA) 20 MG tablet, Take 20 mg by mouth daily., Disp: , Rfl:  .  levothyroxine (SYNTHROID, LEVOTHROID) 75 MCG tablet, Take 75 mcg by mouth daily before breakfast., Disp: , Rfl:  .  losartan (COZAAR) 25 MG tablet, Take 1 tablet (25 mg total) by mouth daily., Disp: 30 tablet, Rfl: 11 .  Multiple Vitamins-Minerals (ADULT GUMMY PO), Take 2 tablets by mouth daily., Disp: , Rfl:  .  predniSONE (DELTASONE) 5 MG tablet, Take 10-20 mg by mouth 2 (two) times daily. 10 mgs in the morning, 20mg s at noon daily, Disp: , Rfl:  .  Probiotic Product (PROBIOTIC DAILY PO), Take 2 tablets by mouth daily. , Disp: , Rfl:  .  rizatriptan (MAXALT) 10 MG tablet, Take 10 mg by mouth daily as needed for migraine. , Disp: , Rfl:  .  simvastatin (ZOCOR) 10 MG tablet, Take 10 mg by mouth daily., Disp: , Rfl:  .  traMADol (ULTRAM) 50 MG tablet, Take 50 mg by mouth. Take 50 mgs at lunch, 50mg s in the evening and 50mg s as needed for pian, Disp: , Rfl: 0 .  KLOR-CON M20 20 MEQ tablet, TAKE 1 TABLET (20 MEQ TOTAL) BY MOUTH 2 (TWO) TIMES DAILY. (Patient not taking: Reported on 03/11/2016), Disp: 60 tablet, Rfl: 10 Allergies  Allergen Reactions  . Gabapentin Itching  . Isosorbide Nitrate Itching  . Wellbutrin  [Bupropion] Other (See Comments)    mental changes and increase anger  . Zonisamide Itching     Objective:     BP 142/51 mmHg  Pulse 100  Temp(Src) 98.4 F (36.9 C) (Oral)  Resp 18  SpO2 92%  She is alert, and in no acute distress  Skin is very pale  Heart regular rhythm no murmurs  Lungs clear  Abdomen: Bowel sounds present, soft, nontender, no obvious hepatosplenomegaly  Laboratory No components found for: D1    Assessment:     Blood loss anemia  Heme-positive stool  Recent melena  History of collagenous colitis  Other medical problems as mentioned above      Plan:     Agree with admission to the hospital. PPI therapy. Transfuse blood as needed. We will proceed with EGD tomorrow to further investigate the upper GI tract. Lab Results  Component Value Date   HGB 6.3* 03/11/2016   HGB 8.6* 10/13/2013   HGB 9.2* 10/12/2013   HGB 12.7 12/16/2011   HGB 11.1* 09/02/2008   HGB 13.1 09/01/2007   HCT 20.2* 03/11/2016   HCT 26.2* 10/13/2013   HCT 27.8* 10/12/2013   HCT 38.6 12/16/2011   HCT 32.8* 09/02/2008   HCT 37.4 09/01/2007   ALKPHOS 55 03/11/2016   ALKPHOS 89 10/05/2013   ALKPHOS 50 12/16/2011   AST 31 03/11/2016   AST 24 10/05/2013   AST 17 12/16/2011   ALT 29 03/11/2016   ALT 20 10/05/2013   ALT 22 12/16/2011

## 2016-03-13 ENCOUNTER — Encounter (HOSPITAL_COMMUNITY): Payer: Self-pay | Admitting: Gastroenterology

## 2016-03-13 DIAGNOSIS — I10 Essential (primary) hypertension: Secondary | ICD-10-CM

## 2016-03-13 DIAGNOSIS — I5022 Chronic systolic (congestive) heart failure: Secondary | ICD-10-CM

## 2016-03-13 DIAGNOSIS — K254 Chronic or unspecified gastric ulcer with hemorrhage: Principal | ICD-10-CM

## 2016-03-13 DIAGNOSIS — E118 Type 2 diabetes mellitus with unspecified complications: Secondary | ICD-10-CM

## 2016-03-13 LAB — URINALYSIS, ROUTINE W REFLEX MICROSCOPIC
Bilirubin Urine: NEGATIVE
Glucose, UA: NEGATIVE mg/dL
Ketones, ur: NEGATIVE mg/dL
NITRITE: NEGATIVE
PH: 5.5 (ref 5.0–8.0)
Protein, ur: NEGATIVE mg/dL
SPECIFIC GRAVITY, URINE: 1.018 (ref 1.005–1.030)

## 2016-03-13 LAB — CBC
HCT: 24.9 % — ABNORMAL LOW (ref 36.0–46.0)
Hemoglobin: 8.3 g/dL — ABNORMAL LOW (ref 12.0–15.0)
MCH: 30.4 pg (ref 26.0–34.0)
MCHC: 33.3 g/dL (ref 30.0–36.0)
MCV: 91.2 fL (ref 78.0–100.0)
PLATELETS: 120 10*3/uL — AB (ref 150–400)
RBC: 2.73 MIL/uL — AB (ref 3.87–5.11)
RDW: 17.4 % — ABNORMAL HIGH (ref 11.5–15.5)
WBC: 7.8 10*3/uL (ref 4.0–10.5)

## 2016-03-13 LAB — BASIC METABOLIC PANEL
Anion gap: 6 (ref 5–15)
BUN: 25 mg/dL — ABNORMAL HIGH (ref 6–20)
CHLORIDE: 112 mmol/L — AB (ref 101–111)
CO2: 24 mmol/L (ref 22–32)
CREATININE: 0.84 mg/dL (ref 0.44–1.00)
Calcium: 7.2 mg/dL — ABNORMAL LOW (ref 8.9–10.3)
Glucose, Bld: 97 mg/dL (ref 65–99)
POTASSIUM: 3.8 mmol/L (ref 3.5–5.1)
SODIUM: 142 mmol/L (ref 135–145)

## 2016-03-13 LAB — URINE MICROSCOPIC-ADD ON

## 2016-03-13 LAB — TYPE AND SCREEN
ABO/RH(D): O POS
Antibody Screen: NEGATIVE
Unit division: 0
Unit division: 0

## 2016-03-13 MED ORDER — PREDNISONE 20 MG PO TABS
20.0000 mg | ORAL_TABLET | Freq: Every day | ORAL | Status: DC
  Administered 2016-03-13 – 2016-03-16 (×4): 20 mg via ORAL
  Filled 2016-03-13 (×4): qty 1

## 2016-03-13 MED ORDER — FUROSEMIDE 20 MG PO TABS
20.0000 mg | ORAL_TABLET | Freq: Every day | ORAL | Status: DC
  Administered 2016-03-13 – 2016-03-15 (×3): 20 mg via ORAL
  Filled 2016-03-13 (×4): qty 1

## 2016-03-13 MED ORDER — LOSARTAN POTASSIUM 50 MG PO TABS
25.0000 mg | ORAL_TABLET | Freq: Every day | ORAL | Status: DC
  Administered 2016-03-14 – 2016-03-16 (×3): 25 mg via ORAL
  Filled 2016-03-13 (×3): qty 1

## 2016-03-13 MED ORDER — PREDNISONE 5 MG PO TABS
10.0000 mg | ORAL_TABLET | Freq: Every day | ORAL | Status: DC
  Administered 2016-03-14 – 2016-03-16 (×3): 10 mg via ORAL
  Filled 2016-03-13 (×3): qty 2

## 2016-03-13 MED ORDER — PRO-STAT SUGAR FREE PO LIQD
30.0000 mL | Freq: Two times a day (BID) | ORAL | Status: DC
  Administered 2016-03-13 – 2016-03-15 (×6): 30 mL via ORAL
  Filled 2016-03-13 (×7): qty 30

## 2016-03-13 NOTE — Progress Notes (Signed)
[facility] PROGRESS NOTE    Progress Note  Olivia Mullen  [id number] DOB: [birth year] DOA: 03/11/2016 PCP: [name], MD     Brief Narrative:   Olivia Mullen is an 80 y.o. female   Assessment/Plan:   Upper GI bleed/acute blood loss anemia Started on Protonix IV twice a day, patient was taking NSAID and prednisone EGD was done on 03/09/2016 that showed esophagitis and a nonbleeding gastric ulcer with visible vessel which was treated nonbleeding. GI recommended to continue clear liquid as an IV Protonix for 24 additional hours. He status post 1 unit of packed red blood cells her hemoglobin is stable.  History of collagenous cholangitis: Discontinue hydrocortisone, start prednisone.  RA: Continue steroids.  Chronic systolic heart failure with an EF of 35%: Compensated continue Coreg, resume lisinopril and Lasix.    DVT prophylaxis: SCD Family Communication:son Disposition Plan/Barrier to D/C: home in 2 days Code Status:     Code Status Orders        Start     Ordered   03/11/16 1822  Do not attempt resuscitation (DNR)   Continuous    Question Answer Comment  In the event of cardiac or respiratory ARREST Do not call a "code blue"   In the event of cardiac or respiratory ARREST Do not perform Intubation, CPR, defibrillation or ACLS   In the event of cardiac or respiratory ARREST Use medication by any route, position, wound care, and other measures to relive pain and suffering. May use oxygen, suction and manual treatment of airway obstruction as needed for comfort.      03/11/16 1821    Code Status History    Date Active Date Inactive Code Status Order ID Comments User Context   10/11/2013  5:47 PM 10/13/2013  4:32 PM Full Code [id number]  Gearlean Alf, MD Inpatient   03/06/2012  7:05 AM 03/09/2012  4:19 PM Full Code [id number]  Cornell Barman, RN Inpatient    Advance Directive Documentation        Most Recent Value   Type of Advance  Directive  Living will, Healthcare Power of Attorney   Pre-existing out of facility DNR order (yellow form or pink MOST form)     "MOST" Form in Place?          IV Access:    Peripheral IV   Procedures and diagnostic studies:   Dg Chest Portable 1 View  03/11/2016  CLINICAL DATA:  Decreased oral intake and weakness for 1 week EXAM: PORTABLE CHEST 1 VIEW COMPARISON:  10/05/2013 FINDINGS: Cardiac silhouette upper normal. Stable calcification and uncoiling of the aorta. Vascular pattern normal. Lungs clear. Left shoulder replacement noted. IMPRESSION: No active disease. Electronically Signed   By: Skipper Cliche M.D.   On: 03/11/2016 16:23     Medical Consultants:    None.  Anti-Infectives:   none  Subjective:    [place] she is complaining that she is hungry and would like something to eat.  Objective:    Filed Vitals:   03/12/16 1520 03/12/16 1550 03/12/16 2116 03/13/16 0600  BP: 156/61 111/91 143/56 139/62  Pulse: 58 60 79 95  Temp:  98.2 F (36.8 C) 98.1 F (36.7 C) 97.7 F (36.5 C)  TempSrc:  Oral Oral Oral  Resp: 19 20 18 14   Height:      Weight:    71.1 kg (156 lb 12 oz)  SpO2: 93% 95% 94% 92%    Intake/Output Summary (Last 24  hours) at 03/13/16 1312 Last data filed at 03/13/16 1028  Gross per 24 hour  Intake    220 ml  Output    300 ml  Net    -80 ml   Filed Weights   03/11/16 1823 03/13/16 0600  Weight: 67.9 kg (149 lb 11.1 oz) 71.1 kg (156 lb 12 oz)    Exam: General exam: In no acute distress. Respiratory system: Good air movement and clear to auscultation. Cardiovascular system: S1 & S2 heard, RRR. No JVD, murmurs, rubs, gallops or clicks.  Gastrointestinal system: Abdomen is nondistended, soft and nontender.  Central nervous system: Alert and oriented. No focal neurological deficits. Extremities: No pedal edema. Skin: No rashes, lesions or ulcers Psychiatry: Judgement and insight appear normal. Mood & affect appropriate.     Data Reviewed:    Labs: Basic Metabolic Panel:  Recent Labs Lab 03/11/16 1531 03/11/16 1603 03/12/16 0844 03/13/16 0524  NA 140 138 141 142  K 2.6* 2.6* 3.7 3.8  CL 105 104 111 112*  CO2 26 26 26 24   GLUCOSE 172* 172* 118* 97  BUN 39* 38* 29* 25*  CREATININE 0.97 1.01* 0.95 0.84  CALCIUM 7.5* 7.5* 7.2* 7.2*   GFR Estimated Creatinine Clearance: 51 mL/min (by C-G formula based on Cr of 0.84). Liver Function Tests:  Recent Labs Lab 03/11/16 1603  AST 31  ALT 29  ALKPHOS 55  BILITOT 0.9  PROT 4.7*  ALBUMIN 2.9*   No results for input(s): LIPASE, AMYLASE in the last 168 hours. No results for input(s): AMMONIA in the last 168 hours. Coagulation profile  Recent Labs Lab 03/11/16 1603  INR 1.10    CBC:  Recent Labs Lab 03/11/16 1531 03/12/16 0844 03/12/16 1830 03/13/16 0524  WBC 11.7* 8.7 13.0* 7.8  NEUTROABS 8.7*  --   --   --   HGB 6.3* 9.0* 9.5* 8.3*  HCT 20.2* 27.4* 28.6* 24.9*  MCV 94.0 91.0 88.8 91.2  PLT 171 125* 129* 120*   Cardiac Enzymes: No results for input(s): CKTOTAL, CKMB, CKMBINDEX, TROPONINI in the last 168 hours. BNP (last 3 results) No results for input(s): PROBNP in the last 8760 hours. CBG:  Recent Labs Lab 03/11/16 1522  GLUCAP 148*   D-Dimer: No results for input(s): DDIMER in the last 72 hours. Hgb A1c: No results for input(s): HGBA1C in the last 72 hours. Lipid Profile: No results for input(s): CHOL, HDL, LDLCALC, TRIG, CHOLHDL, LDLDIRECT in the last 72 hours. Thyroid function studies: No results for input(s): TSH, T4TOTAL, T3FREE, THYROIDAB in the last 72 hours.  Invalid input(s): FREET3 Anemia work up: No results for input(s): VITAMINB12, FOLATE, FERRITIN, TIBC, IRON, RETICCTPCT in the last 72 hours. Sepsis Labs:  Recent Labs Lab 03/11/16 1531 03/12/16 0844 03/12/16 1830 03/13/16 0524  WBC 11.7* 8.7 13.0* 7.8   Microbiology No results found for this or any previous visit (from the past 240  hour(s)).   Medications:   . sodium chloride   Intravenous Once  . acidophilus  1 capsule Oral Daily  . carvedilol  6.25 mg Oral BID WC  . cholecalciferol  2,000 Units Oral Daily  . clonazePAM  1 mg Oral QHS  . doxepin  50 mg Oral QHS  . DULoxetine  120 mg Oral Daily  . feeding supplement (PRO-STAT SUGAR FREE 64)  30 mL Oral BID  . folic acid  1 mg Oral Daily  . glycopyrrolate  2 mg Oral BID  . hydrocortisone sod succinate (SOLU-CORTEF) inj  50  mg Intravenous Q12H  . leflunomide  20 mg Oral Daily  . levothyroxine  75 mcg Oral QAC breakfast  . losartan  50 mg Oral Daily  . [START ON 03/16/2016] pantoprazole (PROTONIX) IV  40 mg Intravenous Q12H  . simvastatin  10 mg Oral q1800   Continuous Infusions: . pantoprozole (PROTONIX) infusion 8 mg/hr (03/13/16 [id number])    Time spent:25 min   LOS: 2 days   Charlynne Cousins  [facility] Pager [phone]  *Please refer to [email or web address], password [email or web address] to get updated schedule on who will round on this patient, as hospitalists switch teams weekly. If 7PM-7AM, please contact night-coverage at [email or web address], password [email or web address] for any overnight needs.  03/13/2016, 1:12 PM

## 2016-03-13 NOTE — Clinical Social Work Placement (Signed)
   CLINICAL SOCIAL WORK PLACEMENT  NOTE  Date:  03/13/2016  Patient Details  Name: [name] MRN: [id number] Date of Birth: 1936-01-25  Clinical Social Work is seeking post-discharge placement for this patient at the [place] level of care (*CSW will initial, date and re-position this form in  chart as items are completed):  Yes   Patient/family provided with [place] Work Department's list of facilities offering this level of care within the geographic area requested by the patient (or if unable, by the patient's family).  Yes   Patient/family informed of their freedom to choose among providers that offer the needed level of care, that participate in Medicare, Medicaid or managed care program needed by the patient, have an available bed and are willing to accept the patient.  Yes   Patient/family informed of [place]'s ownership interest in [facility] and [facility], as well as of the fact that they are under no obligation to receive care at these facilities.  PASRR submitted to EDS on 03/13/16     PASRR number received on 03/13/16     Existing PASRR number confirmed on       FL2 transmitted to all facilities in geographic area requested by pt/family on 03/13/16     FL2 transmitted to all facilities within larger geographic area on       Patient informed that his/her managed care company has contracts with or will negotiate with certain facilities, including the following:            Patient/family informed of bed offers received.  Patient chooses bed at       Physician recommends and patient chooses bed at      Patient to be transferred to   on  .  Patient to be transferred to facility by       Patient family notified on   of transfer.  Name of family member notified:        PHYSICIAN       Additional Comment:    _______________________________________________ Standley Brooking, LCSW 03/13/2016, 4:04 PM

## 2016-03-13 NOTE — NC FL2 (Signed)
[place] LEVEL OF CARE SCREENING TOOL     IDENTIFICATION  Patient Name: Olivia Mullen Birthdate: May 07, 1936 Sex: female Admission Date (Current Location): 03/11/2016  [facility] and [place] Number:  Herbalist and Address:  [facility],  [place] [place], [place]      Provider Number: [id number]  Attending Physician Name and Address:  Charlynne Cousins, MD  Relative Name and Phone Number:       Current Level of Care: Hospital Recommended Level of Care: [place] Prior Approval Number:    Date Approved/Denied:   PASRR Number:    Discharge Plan: Home    Current Diagnoses: Patient Active Problem List   Diagnosis Date Noted  . Anemia 03/11/2016  . GI bleed 03/11/2016  . Hyperlipidemia 02/19/2015  . Postoperative anemia due to acute blood loss 10/12/2013  . Osteoarthritis of right hip 08/31/2013  . Depression   . Chronic systolic heart failure ([place])   . Hypertension 03/08/2012  . Breast cancer, stage 2 ([place]) 12/16/2011  . Rheumatoid arthritis(714.0) 12/16/2011  . Multinodular goiter (nontoxic) 12/16/2011  . Hypothyroidism (acquired) 12/16/2011  . DM type 2 (diabetes mellitus, type 2) ([place]) 12/16/2011  . Normochromic normocytic anemia 12/16/2011    Orientation RESPIRATION BLADDER Height & Weight     Self, Time, Situation, Place  Normal Continent Weight: 156 lb 12 oz (71.1 kg) Height:  5\' 2"  (157.5 cm)  BEHAVIORAL SYMPTOMS/MOOD NEUROLOGICAL BOWEL NUTRITION STATUS      Continent Diet (Clear Liquid Diet)  AMBULATORY STATUS COMMUNICATION OF NEEDS Skin   Extensive Assist Verbally Normal                       Personal Care Assistance Level of Assistance  Bathing, Dressing Bathing Assistance: Limited assistance   Dressing Assistance: Limited assistance     Functional Limitations Info             SPECIAL CARE FACTORS FREQUENCY  PT (By licensed PT), OT (By licensed OT)     PT Frequency:  5 OT Frequency: 5            Contractures      Additional Factors Info  Code Status, Allergies Code Status Info: DNR Allergies Info: Gabapentin, Isosorbide Nitrate, Wellbutrin, Zonisamide           Current Medications (03/13/2016):  This is the current hospital active medication list Current Facility-Administered Medications  Medication Dose Route Frequency Provider Last Rate Last Dose  . 0.9 %  sodium chloride infusion   Intravenous Once Dorie Rank, MD      . acetaminophen (TYLENOL) tablet 650 mg  650 mg Oral Q6H PRN Domenic Polite, MD   650 mg at 03/13/16 0604   Or  . acetaminophen (TYLENOL) suppository 650 mg  650 mg Rectal Q6H PRN Domenic Polite, MD      . acidophilus (RISAQUAD) capsule 1 capsule  1 capsule Oral Daily Domenic Polite, MD   1 capsule at 03/13/16 0820  . carvedilol (COREG) tablet 6.25 mg  6.25 mg Oral BID WC Domenic Polite, MD   6.25 mg at 03/13/16 0758  . cholecalciferol (VITAMIN D) tablet 2,000 Units  2,000 Units Oral Daily Domenic Polite, MD   2,000 Units at 03/13/16 [phone]  . clonazePAM (KLONOPIN) tablet 1 mg  1 mg Oral QHS Domenic Polite, MD   1 mg at 03/12/16 2118  . doxepin (SINEQUAN) capsule 50 mg  50 mg Oral QHS Domenic Polite,  MD   50 mg at 03/12/16 2118  . DULoxetine (CYMBALTA) DR capsule 120 mg  120 mg Oral Daily Domenic Polite, MD   120 mg at 03/13/16 0819  . feeding supplement (PRO-STAT SUGAR FREE 64) liquid 30 mL  30 mL Oral BID Charlynne Cousins, MD   30 mL at 03/13/16 1407  . folic acid (FOLVITE) tablet 1 mg  1 mg Oral Daily Domenic Polite, MD   1 mg at 03/13/16 0820  . furosemide (LASIX) tablet 20 mg  20 mg Oral Daily Charlynne Cousins, MD   20 mg at 03/13/16 1406  . glycopyrrolate (ROBINUL) tablet 2 mg  2 mg Oral BID Domenic Polite, MD   2 mg at 03/13/16 0820  . leflunomide (ARAVA) tablet 20 mg  20 mg Oral Daily Domenic Polite, MD   20 mg at 03/13/16 1011  . levothyroxine (SYNTHROID, LEVOTHROID) tablet 75 mcg  75 mcg Oral QAC breakfast Domenic Polite, MD   75 mcg at 03/13/16 0757  . [START ON 03/14/2016] losartan (COZAAR) tablet 25 mg  25 mg Oral Daily Charlynne Cousins, MD      . ondansetron [facility]) tablet 4 mg  4 mg Oral Q6H PRN Domenic Polite, MD       Or  . ondansetron [facility]) injection 4 mg  4 mg Intravenous Q6H PRN Domenic Polite, MD   4 mg at 03/12/16 1502  . pantoprazole (PROTONIX) 80 mg in sodium chloride 0.9 % 250 mL (0.32 mg/mL) infusion  8 mg/hr Intravenous Continuous [name], MD 25 mL/hr at 03/13/16 0635 8 mg/hr at 03/13/16 0635  . [START ON 03/16/2016] pantoprazole (PROTONIX) injection 40 mg  40 mg Intravenous Q12H [name], MD      . Derrill Memo ON 03/14/2016] predniSONE (DELTASONE) tablet 10 mg  10 mg Oral Q breakfast Charlynne Cousins, MD      . predniSONE (DELTASONE) tablet 20 mg  20 mg Oral Q1200 Charlynne Cousins, MD   20 mg at 03/13/16 1406  . prochlorperazine (COMPAZINE) injection 5 mg  5 mg Intravenous Q6H PRN Domenic Polite, MD   5 mg at 03/12/16 1746  . simvastatin (ZOCOR) tablet 10 mg  10 mg Oral q1800 Domenic Polite, MD   10 mg at 03/12/16 1747  . traMADol (ULTRAM) tablet 50 mg  50 mg Oral Q6H PRN Domenic Polite, MD   50 mg at 03/13/16 0820     Discharge Medications: Please see discharge summary for a list of discharge medications.  Relevant Imaging Results:  Relevant Lab Results:   Additional Information SSN: [id number]  Standley Brooking, LCSW

## 2016-03-13 NOTE — Evaluation (Signed)
Physical Therapy Evaluation Patient Details Name: Olivia Mullen MRN: [id number] DOB: 1936/09/10 Today's Date: 03/13/2016   History of Present Illness  80 yo female adm 03/11/16 with weakness, Hgb 6.3 with heme positive stool and hx of GI bleed; PMHx: DA THA, depression, RA  Clinical Impression  Pt admitted with above diagnosis. Pt currently with functional limitations due to the deficits listed below (see PT Problem List).   Pt will benefit from skilled PT to increase their independence and safety with mobility to allow discharge to the venue listed below.   Pt will benefit from SNF post acute, she is a significant fall risk with hx of recent falls and bil knees buckling, will continue to follow     Follow Up Recommendations SNF    Equipment Recommendations  None recommended by PT    Recommendations for Other Services       Precautions / Restrictions Precautions Precautions: Fall Restrictions Weight Bearing Restrictions: No      Mobility  Bed Mobility Overal bed mobility: Needs Assistance Bed Mobility: Supine to Sit     Supine to sit: Min guard;Min assist     General bed mobility comments: incr time, assist to scoot to EOB  Transfers Overall transfer level: Needs assistance Equipment used: Rolling walker (2 wheeled) Transfers: Sit to/from [facility] Sit to Stand: Min assist Stand pivot transfers: Min assist       General transfer comment: incr time, cues for hand placement and safety; assist to rise and steady  Ambulation/Gait             General Gait Details: pt fatigued; pivotal steps only   Science writer    Modified Rankin (Stroke Patients Only)       Balance Overall balance assessment: History of Falls   Sitting balance-Leahy Scale: Fair     Standing balance support: During functional activity;Single extremity supported Standing balance-Leahy Scale: Poor Standing balance comment: reliant on  UEs for balance                             Pertinent Vitals/Pain Pain Assessment: 0-10 Pain Score: 3  Pain Location: HA Pain Descriptors / Indicators: Headache    Home Living Family/patient expects to be discharged to:: Private residence Living Arrangements: Alone (pt husband passed away in 12-04-2022)   Type of Home: House Home Access: Stairs to enter Entrance Stairs-Rails: Right;Left;Can reach both Entrance Stairs-Number of Steps: 3 Home Layout: One level Home Equipment: Walker - 2 wheels;Cane - quad      Prior Function Level of Independence: Independent with assistive device(s);Independent               Hand Dominance        Extremity/Trunk Assessment   Upper Extremity Assessment: Defer to OT evaluation;Generalized weakness           Lower Extremity Assessment: Generalized weakness;RLE deficits/detail;LLE deficits/detail RLE Deficits / Details: grossly 3+/5 LLE Deficits / Details: grossly 3+ to 4/5     Communication   Communication: No difficulties  Cognition Arousal/Alertness: Awake/alert Behavior During Therapy: WFL for tasks assessed/performed Overall Cognitive Status: Within Functional Limits for tasks assessed                      General Comments      Exercises        Assessment/Plan    PT  Assessment Patient needs continued PT services  PT Diagnosis Difficulty walking   PT Problem List Decreased strength;Decreased activity tolerance;Decreased mobility;Decreased balance  PT Treatment Interventions DME instruction;Gait training;Functional mobility training;Therapeutic activities;Therapeutic exercise;Patient/family education;Balance training   PT Goals (Current goals can be found in the Care Plan section) Acute Rehab PT Goals Patient Stated Goal: get stronger and be able to walk PT Goal Formulation: With patient Time For Goal Achievement: 03/27/16    Frequency Min 3X/week   Barriers to discharge        Co-evaluation                End of Session Equipment Utilized During Treatment: Gait belt Activity Tolerance: Patient tolerated treatment well;Patient limited by fatigue Patient left: in chair;with call bell/phone within reach;with chair alarm set Nurse Communication: Mobility status         Time: [id number] PT Time Calculation (min) (ACUTE ONLY): 23 min   Charges:   PT Evaluation $PT Eval Low Complexity: 1 Procedure PT Treatments $Therapeutic Activity: 8-22 mins   PT G Codes:        [name] 04/04/2016, 12:26 PM

## 2016-03-13 NOTE — Clinical Social Work Note (Signed)
Clinical Social Work Assessment  Patient Details  Name: Olivia Mullen MRN: [id number] Date of Birth: 12-May-1936  Date of referral:  03/13/16               Reason for consult:  Facility Placement                Permission sought to share information with:  Chartered certified accountant granted to share information::  Yes, Verbal Permission Granted  Name::        Agency::     Relationship::     Contact Information:     Housing/Transportation Living arrangements for the past 2 months:  Single Family Home Source of Information:  Patient, Friend/Neighbor, Adult Children Patient Interpreter Needed:  None Criminal Activity/Legal Involvement Pertinent to Current Situation/Hospitalization:  No - Comment as needed Significant Relationships:  Adult Children, Friend Lives with:  Self Do you feel safe going back to the place where you live?  No Need for family participation in patient care:  Yes (Comment)  Care giving concerns:  CSW reviewed PT evaluation recommending SNF at discharge.    Social Worker assessment / plan:  CSW spoke with patient & friend, Olivia Mullen at bedside re: discharge planning. Patient states that she would prefer to return home. Patient's friend brought up concerns about her living alone and ability to care for herself at home. Patient agreed to have information send out to [facility] to see which facilities have availability. CSW will follow-up tomorrow with bed offers.   Employment status:  Retired Forensic scientist:  Medicare PT Recommendations:  [place] / Referral to community resources:  [place]  Patient/Family's Response to care:  CSW spoke with patient's son, Olivia Mullen per patient's request who is very agreeable with plan for SNF, states that he has been trying to get her to agree to go to an [place] since her husband of 65 years/his father passed away in 2022/12/04.   Patient/Family's  Understanding of and Emotional Response to Diagnosis, Current Treatment, and Prognosis:  Patient's son is concerned about patients ability to care for herself, states that she has not been cooking/eating which per son, has likely caused this stomach ulcer.   Emotional Assessment Appearance:  Appears stated age Attitude/Demeanor/Rapport:    Affect (typically observed):    Orientation:  Oriented to Self, Oriented to Place, Oriented to  Time, Oriented to Situation Alcohol / Substance use:    Psych involvement (Current and /or in the community):     Discharge Needs  Concerns to be addressed:    Readmission within the last 30 days:    Current discharge risk:    Barriers to Discharge:      Standley Brooking, LCSW 03/13/2016, 4:00 PM

## 2016-03-13 NOTE — Progress Notes (Signed)
Patient ID: Olivia Mullen, female   DOB: [birth year], 80 y.o.   MRN: [id number] [facility] Gastroenterology Progress Note  Olivia Mullen 80 y.o. December 16, 1935   Subjective: Doing ok. Sitting in bedside chair. No BMs overnight. Denies abdominal pain. Wants some broth.  Objective: Vital signs in last 24 hours: Filed Vitals:   03/12/16 2116 03/13/16 0600  BP: 143/56 139/62  Pulse: 79 95  Temp: 98.1 F (36.7 C) 97.7 F (36.5 C)  Resp: 18 14    Physical Exam: Gen: alert, no acute distress, elderly HEENT: anicteric sclera CV: RRR Chest: CTA B Abd: left-sided tenderness with guarding, soft, nondistended, +BS  Lab Results:  Recent Labs  03/12/16 0844 03/13/16 0524  NA 141 142  K 3.7 3.8  CL 111 112*  CO2 26 24  GLUCOSE 118* 97  BUN 29* 25*  CREATININE 0.95 0.84  CALCIUM 7.2* 7.2*    Recent Labs  03/11/16 1603  AST 31  ALT 29  ALKPHOS 55  BILITOT 0.9  PROT 4.7*  ALBUMIN 2.9*    Recent Labs  03/11/16 1531  03/12/16 1830 03/13/16 0524  WBC 11.7*  < > 13.0* 7.8  NEUTROABS 8.7*  --   --   --   HGB 6.3*  < > 9.5* 8.3*  HCT 20.2*  < > 28.6* 24.9*  MCV 94.0  < > 88.8 91.2  PLT 171  < > 129* 120*  < > = values in this interval not displayed.  Recent Labs  03/11/16 1603  LABPROT 14.0  INR 1.10      Assessment/Plan: 80 yo with large gastric ulcer with a non-bleeding visible vessel that was cauterized and the ulcer was injected with epinephrine:saline mixture. Hgb 8.3 (9.5). Talked with son by phone who reports that her baseline Hgb is 8.0. Will change to clear liquid diet and not advance further until tomorrow if stable. Gastric biopsies pending. Will follow.    [place] C. 03/13/2016, 12:14 PM  Pager [phone]  If no answer or after 5 PM call [phone]

## 2016-03-14 DIAGNOSIS — D62 Acute posthemorrhagic anemia: Secondary | ICD-10-CM

## 2016-03-14 LAB — CBC
HEMATOCRIT: 25.9 % — AB (ref 36.0–46.0)
Hemoglobin: 8.6 g/dL — ABNORMAL LOW (ref 12.0–15.0)
MCH: 30.7 pg (ref 26.0–34.0)
MCHC: 33.2 g/dL (ref 30.0–36.0)
MCV: 92.5 fL (ref 78.0–100.0)
Platelets: 128 10*3/uL — ABNORMAL LOW (ref 150–400)
RBC: 2.8 MIL/uL — ABNORMAL LOW (ref 3.87–5.11)
RDW: 17.8 % — AB (ref 11.5–15.5)
WBC: 7.3 10*3/uL (ref 4.0–10.5)

## 2016-03-14 NOTE — Consult Note (Signed)
   [facility] CM Inpatient Consult   03/14/2016  Olivia Mullen 1936/02/06 [id number]   [facility] Care Management follow up. Chart reviewed. Spoke with inpatient Licensed CSW. Discharge plan is for SNF. Will not engage for [facility] Care Management at this time. Should post hospital discharge needs change, please re-consult [facility] Care Management.   Marthenia Rolling, MSN-Ed, RN,BSN [facility] Liaison [phone]

## 2016-03-14 NOTE — Progress Notes (Signed)
Nutrition Follow-up  DOCUMENTATION CODES:   Not applicable  INTERVENTION:  -Prostat BID. Each supplement provides 100 kcals and 15 grams of protein.  -Discontinue Boost Breeze per pt request.   NUTRITION DIAGNOSIS:   Inadequate oral intake related to poor appetite as evidenced by per patient/family report.   GOAL:   Patient will meet greater than or equal to 90% of their needs   MONITOR:   PO intake, Supplement acceptance, Labs, Weight trends, Skin, I & O's  ASSESSMENT:   Pt with a history of collagenous colitis which seems to be under control at this time with prednisone. She also has been put on dicyclomine recently for abdominal cramping. She has other medical problems including congestive heart failure and rheumatoid arthritis. For the past 5 or 6 days she has been feeling very weak and rundown. 5 days ago she had a black tarry stool. Because of ongoing weakness she finally came to the emergency department to be evaluated. She also collapsed today at home but did not lose consciousness. She was found to have a hemoglobin of 6.3 and her stool was heme positive. She takes ibuprofen on a regular basis for headaches. She did vomit but did not vomit any blood.  Upper endoscopy on 5/23 found Large cratered distal stomach ulcer with a non-bleeding visible vessel.  Diet advanced to clear liquids yesterday. Pt reports tolerating CL well. No N/V.  Pt reports taking both Prostats yesterday. She preferred it mixed with a drink. She stated she would try it in lemonade today. Will continue to send it BID.   Labs reviewed; Cl 112, BUN 25, creat 7.2.  Meds reviewed; vitamin D, folic acid, Lasix 20 mg.   Diet Order:  Diet clear liquid Room service appropriate?: Yes; Fluid consistency:: Thin  Skin:  Reviewed, no issues  Last BM:  5/18  Height:   Ht Readings from Last 1 Encounters:  03/11/16 5\' 2"  (1.575 m)    Weight:   Wt Readings from Last 1 Encounters:  03/14/16 155 lb 11.2 oz  (70.625 kg)    Ideal Body Weight:  50 kg  BMI:  Body mass index is 28.47 kg/(m^2).  Estimated Nutritional Needs:   Kcal:  1350-1550  Protein:  70-80 g  Fluid:  1.5L  EDUCATION NEEDS:   No education needs identified at this time  Geoffery Lyons, [place] Dietetic Intern Pager [phone]

## 2016-03-14 NOTE — Progress Notes (Signed)
Patient ID: Olivia Mullen, female   DOB: 15-Jul-1936, 80 y.o.   MRN: [id number] [facility] Gastroenterology Progress Note  Olivia Mullen 80 y.o. [birth year]   Subjective: Tearful. Complains of abdominal "soreness" but denies severe pain. Denies N/V. Son at bedside.  Objective: Vital signs in last 24 hours: Filed Vitals:   03/13/16 2141 03/14/16 0542  BP: 150/64 143/59  Pulse: 83 73  Temp: 98.2 F (36.8 C) 98 F (36.7 C)  Resp: 16 16    Physical Exam: Gen: alert, tearful, well-nourished CV: RRR Chest: CTA B Abd: epigastric tenderness with guarding, soft, nondistended, +BS  Lab Results:  Recent Labs  03/12/16 0844 03/13/16 0524  NA 141 142  K 3.7 3.8  CL 111 112*  CO2 26 24  GLUCOSE 118* 97  BUN 29* 25*  CREATININE 0.95 0.84  CALCIUM 7.2* 7.2*    Recent Labs  03/11/16 1603  AST 31  ALT 29  ALKPHOS 55  BILITOT 0.9  PROT 4.7*  ALBUMIN 2.9*    Recent Labs  03/11/16 1531  03/13/16 0524 03/14/16 0444  WBC 11.7*  < > 7.8 7.3  NEUTROABS 8.7*  --   --   --   HGB 6.3*  < > 8.3* 8.6*  HCT 20.2*  < > 24.9* 25.9*  MCV 94.0  < > 91.2 92.5  PLT 171  < > 120* 128*  < > = values in this interval not displayed.  Recent Labs  03/11/16 1603  LABPROT 14.0  INR 1.10      Assessment/Plan: S/P GI bleed from large gastric ulcer with nonbleeding visible vessel that was cauterized and injected with epinephrine. Crying because she does not want to go to a SNF. Hgb stable at 8.6. Gastric biopsies benign and no H. Pylori. Ulcer likely due to NSAIDs and advised to avoid all NSAIDs. Continue Protonix drip to complete a 72 hour course and then can change to PO PPI BID for 3 months and then QD indefinitely. Will need a f/u EGD in 3-4 months to assess healing of the gastric ulcer. Advance diet to heart healthy diet. F/U with me in office in 6-8 weeks. Will sign off. Call if questions.   [place] C. 03/14/2016, 11:08 AM  Pager [phone]  If no answer or after 5 PM  call [phone]

## 2016-03-14 NOTE — Clinical Social Work Placement (Signed)
CSW provided SNF bed offers to patient & son, [name] at bedside - patient accepted bed at [facility] confirmed with Narda Rutherford at [facility] that they would be able to take patient on Saturday as patient is anticipated to discharge then. Son plans to tour & sign admission paperwork tomorrow at 11am at [place], [place] Social Worker cell #: [phone]     CLINICAL SOCIAL WORK PLACEMENT  NOTE  Date:  03/14/2016  Patient Details  Name: Olivia Mullen MRN: [id number] Date of Birth: May 06, 1936  Clinical Social Work is seeking post-discharge placement for this patient at the [place] level of care (*CSW will initial, date and re-position this form in  chart as items are completed):  Yes   Patient/family provided with [place] Work Department's list of facilities offering this level of care within the geographic area requested by the patient (or if unable, by the patient's family).  Yes   Patient/family informed of their freedom to choose among providers that offer the needed level of care, that participate in Medicare, Medicaid or managed care program needed by the patient, have an available bed and are willing to accept the patient.  Yes   Patient/family informed of [facility]'s ownership interest in [facility] and [facility], as well as of the fact that they are under no obligation to receive care at these facilities.  PASRR submitted to EDS on 03/13/16     PASRR number received on 03/13/16     Existing PASRR number confirmed on       FL2 transmitted to all facilities in geographic area requested by pt/family on 03/13/16     FL2 transmitted to all facilities within larger geographic area on       Patient informed that his/her managed care company has contracts with or will negotiate with certain facilities, including the following:        Yes   Patient/family informed of bed offers  received.  Patient [facility] bed at [facility]     Physician recommends and patient chooses bed at      Patient to be transferred to [facility] on  .  Patient to be transferred to facility by       Patient family notified on   of transfer.  Name of family member notified:        PHYSICIAN       Additional Comment:    _______________________________________________ Standley Brooking, LCSW 03/14/2016, 3:25 PM

## 2016-03-14 NOTE — Care Management Important Message (Signed)
Important Message  Patient Details  Name: Olivia Mullen MRN: [id number] Date of Birth: 1936-05-30   Medicare Important Message Given:  Yes    Camillo Flaming 03/14/2016, 10:52 AMImportant Message  Patient Details  Name: Olivia Mullen MRN: [id number] Date of Birth: 1936/10/16   Medicare Important Message Given:  Yes    Camillo Flaming 03/14/2016, 10:52 AM

## 2016-03-14 NOTE — Progress Notes (Signed)
[facility] PROGRESS NOTE    Progress Note  Olivia Mullen  [id number] DOB: Aug 09, 1936 DOA: 03/11/2016 PCP: [name], MD     Brief Narrative:   Olivia Mullen is an 80 y.o. female   Assessment/Plan:   Upper GI bleed/acute blood loss anemia: Started on Protonix IV twice a day, We'll probably transition to oral Protonix today and monitor overnight deferred to GI. EGD was done on 03/09/2016 that showed esophagitis and a nonbleeding gastric ulcer with visible vessel which was treated nonbleeding. Tolerated clear liquid diet was overnight advance to soft diet. GI recommendation to follow. Her hemoglobin is stable.  History of collagenous cholangitis: Discontinue hydrocortisone, cont. prednisone.  RA: Continue steroids.  Chronic systolic heart failure with an EF of 35%: Compensated continue Coreg, resume lisinopril and Lasix.    DVT prophylaxis: SCD Family Communication:son Disposition Plan/Barrier to D/C: home in am Code Status:     Code Status Orders        Start     Ordered   03/11/16 1822  Do not attempt resuscitation (DNR)   Continuous    Question Answer Comment  In the event of cardiac or respiratory ARREST Do not call a "code blue"   In the event of cardiac or respiratory ARREST Do not perform Intubation, CPR, defibrillation or ACLS   In the event of cardiac or respiratory ARREST Use medication by any route, position, wound care, and other measures to relive pain and suffering. May use oxygen, suction and manual treatment of airway obstruction as needed for comfort.      03/11/16 1821    Code Status History    Date Active Date Inactive Code Status Order ID Comments User Context   10/11/2013  5:47 PM 10/13/2013  4:32 PM Full Code [id number]  Gearlean Alf, MD Inpatient   03/06/2012  7:05 AM 03/09/2012  4:19 PM Full Code [id number]  Cornell Barman, RN Inpatient    Advance Directive Documentation        Most Recent Value   Type of  Advance Directive  Living will, Healthcare Power of Attorney   Pre-existing out of facility DNR order (yellow form or pink MOST form)     "MOST" Form in Place?          IV Access:    Peripheral IV   Procedures and diagnostic studies:   No results found.   Medical Consultants:    None.  Anti-Infectives:   none  Subjective:    Olivia Mullen patient with labile emotion as she's having a hard time dealing about going to a skilled temporarily.  Objective:    Filed Vitals:   03/13/16 0600 03/13/16 1353 03/13/16 2141 03/14/16 0542  BP: 139/62 136/64 150/64 143/59  Pulse: 95 82 83 73  Temp: 97.7 F (36.5 C) 98.8 F (37.1 C) 98.2 F (36.8 C) 98 F (36.7 C)  TempSrc: Oral Oral Oral Oral  Resp: 14 16 16 16   Height:      Weight: 71.1 kg (156 lb 12 oz)   70.625 kg (155 lb 11.2 oz)  SpO2: 92% 92% 97%     Intake/Output Summary (Last 24 hours) at 03/14/16 1033 Last data filed at 03/14/16 1012  Gross per 24 hour  Intake  552.5 ml  Output   1350 ml  Net -797.5 ml   Filed Weights   03/11/16 1823 03/13/16 0600 03/14/16 0542  Weight: 67.9 kg (149 lb 11.1 oz) 71.1 kg (156 lb 12 oz) 70.625  kg (155 lb 11.2 oz)    Exam: General exam: In no acute distress. Respiratory system: Good air movement and clear to auscultation. Cardiovascular system: S1 & S2 heard, RRR.  Gastrointestinal system: Abdomen is nondistended, soft and nontender.  Central nervous system: Alert and oriented. No focal neurological deficits. Psychiatry: Judgement and insight appear normal. Mood & affect appropriate.    Data Reviewed:    Labs: Basic Metabolic Panel:  Recent Labs Lab 03/11/16 1531 03/11/16 1603 03/12/16 0844 03/13/16 0524  NA 140 138 141 142  K 2.6* 2.6* 3.7 3.8  CL 105 104 111 112*  CO2 26 26 26 24   GLUCOSE 172* 172* 118* 97  BUN 39* 38* 29* 25*  CREATININE 0.97 1.01* 0.95 0.84  CALCIUM 7.5* 7.5* 7.2* 7.2*   GFR Estimated Creatinine Clearance: 50.8 mL/min (by C-G  formula based on Cr of 0.84). Liver Function Tests:  Recent Labs Lab 03/11/16 1603  AST 31  ALT 29  ALKPHOS 55  BILITOT 0.9  PROT 4.7*  ALBUMIN 2.9*   No results for input(s): LIPASE, AMYLASE in the last 168 hours. No results for input(s): AMMONIA in the last 168 hours. Coagulation profile  Recent Labs Lab 03/11/16 1603  INR 1.10    CBC:  Recent Labs Lab 03/11/16 1531 03/12/16 0844 03/12/16 1830 03/13/16 0524 03/14/16 0444  WBC 11.7* 8.7 13.0* 7.8 7.3  NEUTROABS 8.7*  --   --   --   --   HGB 6.3* 9.0* 9.5* 8.3* 8.6*  HCT 20.2* 27.4* 28.6* 24.9* 25.9*  MCV 94.0 91.0 88.8 91.2 92.5  PLT 171 125* 129* 120* 128*   Cardiac Enzymes: No results for input(s): CKTOTAL, CKMB, CKMBINDEX, TROPONINI in the last 168 hours. BNP (last 3 results) No results for input(s): PROBNP in the last 8760 hours. CBG:  Recent Labs Lab 03/11/16 1522  GLUCAP 148*   D-Dimer: No results for input(s): DDIMER in the last 72 hours. Hgb A1c: No results for input(s): HGBA1C in the last 72 hours. Lipid Profile: No results for input(s): CHOL, HDL, LDLCALC, TRIG, CHOLHDL, LDLDIRECT in the last 72 hours. Thyroid function studies: No results for input(s): TSH, T4TOTAL, T3FREE, THYROIDAB in the last 72 hours.  Invalid input(s): FREET3 Anemia work up: No results for input(s): VITAMINB12, FOLATE, FERRITIN, TIBC, IRON, RETICCTPCT in the last 72 hours. Sepsis Labs:  Recent Labs Lab 03/12/16 0844 03/12/16 1830 03/13/16 0524 03/14/16 0444  WBC 8.7 13.0* 7.8 7.3   Microbiology No results found for this or any previous visit (from the past 240 hour(s)).   Medications:   . sodium chloride   Intravenous Once  . acidophilus  1 capsule Oral Daily  . carvedilol  6.25 mg Oral BID WC  . cholecalciferol  2,000 Units Oral Daily  . clonazePAM  1 mg Oral QHS  . doxepin  50 mg Oral QHS  . DULoxetine  120 mg Oral Daily  . feeding supplement (PRO-STAT SUGAR FREE 64)  30 mL Oral BID  . folic acid   1 mg Oral Daily  . furosemide  20 mg Oral Daily  . glycopyrrolate  2 mg Oral BID  . leflunomide  20 mg Oral Daily  . levothyroxine  75 mcg Oral QAC breakfast  . losartan  25 mg Oral Daily  . [START ON 03/16/2016] pantoprazole (PROTONIX) IV  40 mg Intravenous Q12H  . predniSONE  10 mg Oral Q breakfast  . predniSONE  20 mg Oral Q1200  . simvastatin  10 mg Oral q1800  Continuous Infusions: . pantoprozole (PROTONIX) infusion 8 mg/hr (03/14/16 0340)    Time spent:15 min   LOS: 3 days   Charlynne Cousins  [facility] Pager [phone]  *Please refer to [email or web address], password [email or web address] to get updated schedule on who will round on this patient, as hospitalists switch teams weekly. If 7PM-7AM, please contact night-coverage at [email or web address], password [email or web address] for any overnight needs.  03/14/2016, 10:33 AM

## 2016-03-14 NOTE — Progress Notes (Signed)
Physical Therapy Treatment Patient Details Name: Olivia Mullen MRN: [id number] DOB: 16-Feb-1936 Today's Date: 03/14/2016    History of Present Illness 80 yo female adm 03/11/16 with weakness, Hgb 6.3 with heme positive stool and hx of GI bleed; PMHx: DA THA, depression, RA    PT Comments    Progressing with mobility. Continues to require Min assist for mobility.   Follow Up Recommendations  SNF     Equipment Recommendations  None recommended by PT    Recommendations for Other Services       Precautions / Restrictions Precautions Precautions: Fall Restrictions Weight Bearing Restrictions: No    Mobility  Bed Mobility Overal bed mobility: Needs Assistance Bed Mobility: Supine to Sit     Supine to sit: Supervision     General bed mobility comments: for safety. Increased time.   Transfers Overall transfer level: Needs assistance Equipment used: Rolling walker (2 wheeled) Transfers: Sit to/from Stand Sit to Stand: Min assist         General transfer comment: incr time, cues for hand placement and safety; assist to rise and steady  Ambulation/Gait Ambulation/Gait assistance: Min assist Ambulation Distance (Feet): 15 Feet (x2) Assistive device: Rolling walker (2 wheeled) Gait Pattern/deviations: Step-through pattern;Decreased stride length     General Gait Details: Assist to stabilize. Walked to and from bathroom on today. Pt tolerated distance well.    Stairs            Wheelchair Mobility    Modified Rankin (Stroke Patients Only)       Balance                                    Cognition Arousal/Alertness: Awake/alert Behavior During Therapy: WFL for tasks assessed/performed Overall Cognitive Status: Within Functional Limits for tasks assessed                      Exercises      General Comments        Pertinent Vitals/Pain Pain Assessment: Faces Faces Pain Scale: Hurts a little bit Pain Location:  buttocks Pain Descriptors / Indicators: Sore Pain Intervention(s): Monitored during session;Repositioned    Home Living                      Prior Function            PT Goals (current goals can now be found in the care plan section) Progress towards PT goals: Progressing toward goals    Frequency  Min 3X/week    PT Plan Current plan remains appropriate    Co-evaluation             End of Session   Activity Tolerance: Patient tolerated treatment well Patient left: in chair;with call bell/phone within reach;with chair alarm set     Time: [id number] PT Time Calculation (min) (ACUTE ONLY): 14 min  Charges:  $Gait Training: 8-22 mins                    G Codes:      Weston Anna, MPT Pager: [phone]

## 2016-03-15 MED ORDER — DIPHENHYDRAMINE HCL 25 MG PO CAPS
25.0000 mg | ORAL_CAPSULE | Freq: Once | ORAL | Status: AC
  Administered 2016-03-15: 25 mg via ORAL
  Filled 2016-03-15: qty 1

## 2016-03-15 MED ORDER — PANTOPRAZOLE SODIUM 40 MG PO TBEC
40.0000 mg | DELAYED_RELEASE_TABLET | Freq: Two times a day (BID) | ORAL | Status: DC
  Administered 2016-03-15 – 2016-03-16 (×2): 40 mg via ORAL
  Filled 2016-03-15 (×2): qty 1

## 2016-03-15 MED ORDER — IBUPROFEN 200 MG PO TABS
600.0000 mg | ORAL_TABLET | Freq: Once | ORAL | Status: AC
Start: 2016-03-15 — End: 2016-03-15
  Administered 2016-03-15: 600 mg via ORAL
  Filled 2016-03-15: qty 3

## 2016-03-15 NOTE — Progress Notes (Signed)
[facility] PROGRESS NOTE    Progress Note  Olivia Mullen  [id number] DOB: Jan 26, 1936 DOA: 03/11/2016 PCP: [name], MD     Brief Narrative:   Olivia Mullen is an 80 y.o. female   Assessment/Plan:   Upper GI bleed/acute blood loss anemia: Changed protonic Drip to oral, tolerating diet, hemoglobin has been stable. Monitor for an additional 24 hours and if stable can be discharged in the morning.  History of collagenous cholangitis: Discontinue hydrocortisone, cont. prednisone.  RA: Continue steroids.  Chronic systolic heart failure with an EF of 35%: Compensated continue Coreg, resume lisinopril and Lasix.    DVT prophylaxis: SCD Family Communication:son Disposition Plan/Barrier to D/C: home in am Code Status:     Code Status Orders        Start     Ordered   03/11/16 1822  Do not attempt resuscitation (DNR)   Continuous    Question Answer Comment  In the event of cardiac or respiratory ARREST Do not call a "code blue"   In the event of cardiac or respiratory ARREST Do not perform Intubation, CPR, defibrillation or ACLS   In the event of cardiac or respiratory ARREST Use medication by any route, position, wound care, and other measures to relive pain and suffering. May use oxygen, suction and manual treatment of airway obstruction as needed for comfort.      03/11/16 1821    Code Status History    Date Active Date Inactive Code Status Order ID Comments User Context   10/11/2013  5:47 PM 10/13/2013  4:32 PM Full Code [id number]  Gearlean Alf, MD Inpatient   03/06/2012  7:05 AM 03/09/2012  4:19 PM Full Code [id number]  Cornell Barman, RN Inpatient    Advance Directive Documentation        Most Recent Value   Type of Advance Directive  Living will, Healthcare Power of Attorney   Pre-existing out of facility DNR order (yellow form or pink MOST form)     "MOST" Form in Place?          IV Access:    Peripheral  IV   Procedures and diagnostic studies:   No results found.   Medical Consultants:    None.  Anti-Infectives:   none  Subjective:    [place] [facility] patient with labile emotion.  Objective:    Filed Vitals:   03/14/16 0542 03/14/16 1346 03/14/16 2124 03/15/16 0617  BP: 143/59 162/62 171/73 143/55  Pulse: 73 94 88 72  Temp: 98 F (36.7 C) 98.2 F (36.8 C) 98.6 F (37 C) 97.9 F (36.6 C)  TempSrc: Oral Oral Oral Oral  Resp: 16 18 18 18   Height:      Weight: 70.625 kg (155 lb 11.2 oz)   70.444 kg (155 lb 4.8 oz)  SpO2:  98% 97% 97%    Intake/Output Summary (Last 24 hours) at 03/15/16 1030 Last data filed at 03/15/16 0900  Gross per 24 hour  Intake 1203.33 ml  Output   2000 ml  Net -796.67 ml   Filed Weights   03/13/16 0600 03/14/16 0542 03/15/16 0617  Weight: 71.1 kg (156 lb 12 oz) 70.625 kg (155 lb 11.2 oz) 70.444 kg (155 lb 4.8 oz)    Exam: General exam: In no acute distress. Respiratory system: Good air movement and clear to auscultation. Cardiovascular system: S1 & S2 heard, RRR.  Gastrointestinal system: Abdomen is nondistended, soft and nontender.  Central nervous system: Alert and  oriented. No focal neurological deficits. Psychiatry: Judgement and insight appear normal. Mood & affect appropriate.    Data Reviewed:    Labs: Basic Metabolic Panel:  Recent Labs Lab 03/11/16 1531 03/11/16 1603 03/12/16 0844 03/13/16 0524  NA 140 138 141 142  K 2.6* 2.6* 3.7 3.8  CL 105 104 111 112*  CO2 26 26 26 24   GLUCOSE 172* 172* 118* 97  BUN 39* 38* 29* 25*  CREATININE 0.97 1.01* 0.95 0.84  CALCIUM 7.5* 7.5* 7.2* 7.2*   GFR Estimated Creatinine Clearance: 50.7 mL/min (by C-G formula based on Cr of 0.84). Liver Function Tests:  Recent Labs Lab 03/11/16 1603  AST 31  ALT 29  ALKPHOS 55  BILITOT 0.9  PROT 4.7*  ALBUMIN 2.9*   No results for input(s): LIPASE, AMYLASE in the last 168 hours. No results for input(s): AMMONIA in the last  168 hours. Coagulation profile  Recent Labs Lab 03/11/16 1603  INR 1.10    CBC:  Recent Labs Lab 03/11/16 1531 03/12/16 0844 03/12/16 1830 03/13/16 0524 03/14/16 0444  WBC 11.7* 8.7 13.0* 7.8 7.3  NEUTROABS 8.7*  --   --   --   --   HGB 6.3* 9.0* 9.5* 8.3* 8.6*  HCT 20.2* 27.4* 28.6* 24.9* 25.9*  MCV 94.0 91.0 88.8 91.2 92.5  PLT 171 125* 129* 120* 128*   Cardiac Enzymes: No results for input(s): CKTOTAL, CKMB, CKMBINDEX, TROPONINI in the last 168 hours. BNP (last 3 results) No results for input(s): PROBNP in the last 8760 hours. CBG:  Recent Labs Lab 03/11/16 1522  GLUCAP 148*   D-Dimer: No results for input(s): DDIMER in the last 72 hours. Hgb A1c: No results for input(s): HGBA1C in the last 72 hours. Lipid Profile: No results for input(s): CHOL, HDL, LDLCALC, TRIG, CHOLHDL, LDLDIRECT in the last 72 hours. Thyroid function studies: No results for input(s): TSH, T4TOTAL, T3FREE, THYROIDAB in the last 72 hours.  Invalid input(s): FREET3 Anemia work up: No results for input(s): VITAMINB12, FOLATE, FERRITIN, TIBC, IRON, RETICCTPCT in the last 72 hours. Sepsis Labs:  Recent Labs Lab 03/12/16 0844 03/12/16 1830 03/13/16 0524 03/14/16 0444  WBC 8.7 13.0* 7.8 7.3   Microbiology No results found for this or any previous visit (from the past 240 hour(s)).   Medications:   . sodium chloride   Intravenous Once  . acidophilus  1 capsule Oral Daily  . carvedilol  6.25 mg Oral BID WC  . cholecalciferol  2,000 Units Oral Daily  . clonazePAM  1 mg Oral QHS  . doxepin  50 mg Oral QHS  . DULoxetine  120 mg Oral Daily  . feeding supplement (PRO-STAT SUGAR FREE 64)  30 mL Oral BID  . folic acid  1 mg Oral Daily  . furosemide  20 mg Oral Daily  . glycopyrrolate  2 mg Oral BID  . leflunomide  20 mg Oral Daily  . levothyroxine  75 mcg Oral QAC breakfast  . losartan  25 mg Oral Daily  . [START ON 03/16/2016] pantoprazole (PROTONIX) IV  40 mg Intravenous Q12H  .  predniSONE  10 mg Oral Q breakfast  . predniSONE  20 mg Oral Q1200  . simvastatin  10 mg Oral q1800   Continuous Infusions: . pantoprozole (PROTONIX) infusion 8 mg/hr (03/15/16 0156)    Time spent:15 min   LOS: 4 days   Charlynne Cousins  [facility] Pager [phone]  *Please refer to [email or web address], password [email or web address] to get updated schedule on who  will round on this patient, as hospitalists switch teams weekly. If 7PM-7AM, please contact night-coverage at [email or web address], password [email or web address] for any overnight needs.  03/15/2016, 10:30 AM

## 2016-03-15 NOTE — Progress Notes (Signed)
Physical Therapy Treatment Patient Details Name: Olivia Mullen MRN: [id number] DOB: 1936/05/02 Today's Date: 03/15/2016    History of Present Illness 80 yo female adm 03/11/16 with weakness, Hgb 6.3 with heme positive stool and hx of GI bleed; PMHx: DA THA, depression, RA    PT Comments    Progressing well with mobility.   Follow Up Recommendations  SNF     Equipment Recommendations  None recommended by PT    Recommendations for Other Services       Precautions / Restrictions Precautions Precautions: Fall Restrictions Weight Bearing Restrictions: No    Mobility  Bed Mobility Overal bed mobility: Modified Independent Bed Mobility: Supine to Sit;Sit to Supine     Supine to sit: Modified independent (Device/Increase time)        Transfers Overall transfer level: Needs assistance   Transfers: Sit to/from Stand Sit to Stand: Min guard         General transfer comment: close guard for safety.   Ambulation/Gait Ambulation/Gait assistance: Min guard Ambulation Distance (Feet): 150 Feet Assistive device: Rolling walker (2 wheeled) Gait Pattern/deviations: Step-through pattern;Decreased stride length     General Gait Details: walked to and from bathroom while holding on to IV pole only. Then pt walked in hallway with RW. Pt tolerated distance well.    Stairs            Wheelchair Mobility    Modified Rankin (Stroke Patients Only)       Balance                                    Cognition Arousal/Alertness: Awake/alert Behavior During Therapy: WFL for tasks assessed/performed Overall Cognitive Status: Within Functional Limits for tasks assessed                      Exercises      General Comments        Pertinent Vitals/Pain Pain Assessment: No/denies pain    Home Living                      Prior Function            PT Goals (current goals can now be found in the care plan section) Progress  towards PT goals: Progressing toward goals    Frequency  Min 3X/week    PT Plan Current plan remains appropriate    Co-evaluation             End of Session   Activity Tolerance: Patient tolerated treatment well Patient left: in bed;with call bell/phone within reach;with bed alarm set     Time: 1200-1219 PT Time Calculation (min) (ACUTE ONLY): 19 min  Charges:  $Gait Training: 8-22 mins                    G Codes:      Weston Anna, MPT Pager: [phone]

## 2016-03-16 DIAGNOSIS — E119 Type 2 diabetes mellitus without complications: Secondary | ICD-10-CM | POA: Diagnosis not present

## 2016-03-16 DIAGNOSIS — K830 Cholangitis: Secondary | ICD-10-CM | POA: Diagnosis not present

## 2016-03-16 DIAGNOSIS — I10 Essential (primary) hypertension: Secondary | ICD-10-CM | POA: Diagnosis not present

## 2016-03-16 DIAGNOSIS — I5022 Chronic systolic (congestive) heart failure: Secondary | ICD-10-CM | POA: Diagnosis not present

## 2016-03-16 DIAGNOSIS — F322 Major depressive disorder, single episode, severe without psychotic features: Secondary | ICD-10-CM | POA: Diagnosis not present

## 2016-03-16 DIAGNOSIS — I509 Heart failure, unspecified: Secondary | ICD-10-CM | POA: Diagnosis not present

## 2016-03-16 DIAGNOSIS — F411 Generalized anxiety disorder: Secondary | ICD-10-CM | POA: Diagnosis not present

## 2016-03-16 DIAGNOSIS — E1165 Type 2 diabetes mellitus with hyperglycemia: Secondary | ICD-10-CM | POA: Diagnosis not present

## 2016-03-16 DIAGNOSIS — F332 Major depressive disorder, recurrent severe without psychotic features: Secondary | ICD-10-CM | POA: Diagnosis not present

## 2016-03-16 DIAGNOSIS — E118 Type 2 diabetes mellitus with unspecified complications: Secondary | ICD-10-CM | POA: Diagnosis not present

## 2016-03-16 DIAGNOSIS — K250 Acute gastric ulcer with hemorrhage: Secondary | ICD-10-CM | POA: Diagnosis not present

## 2016-03-16 DIAGNOSIS — F064 Anxiety disorder due to known physiological condition: Secondary | ICD-10-CM | POA: Diagnosis not present

## 2016-03-16 DIAGNOSIS — K922 Gastrointestinal hemorrhage, unspecified: Secondary | ICD-10-CM | POA: Diagnosis not present

## 2016-03-16 DIAGNOSIS — F0391 Unspecified dementia with behavioral disturbance: Secondary | ICD-10-CM | POA: Diagnosis not present

## 2016-03-16 DIAGNOSIS — D500 Iron deficiency anemia secondary to blood loss (chronic): Secondary | ICD-10-CM | POA: Diagnosis not present

## 2016-03-16 DIAGNOSIS — K52831 Collagenous colitis: Secondary | ICD-10-CM | POA: Diagnosis not present

## 2016-03-16 DIAGNOSIS — R278 Other lack of coordination: Secondary | ICD-10-CM | POA: Diagnosis not present

## 2016-03-16 DIAGNOSIS — K254 Chronic or unspecified gastric ulcer with hemorrhage: Secondary | ICD-10-CM | POA: Diagnosis not present

## 2016-03-16 DIAGNOSIS — K279 Peptic ulcer, site unspecified, unspecified as acute or chronic, without hemorrhage or perforation: Secondary | ICD-10-CM | POA: Diagnosis not present

## 2016-03-16 DIAGNOSIS — R2689 Other abnormalities of gait and mobility: Secondary | ICD-10-CM | POA: Diagnosis not present

## 2016-03-16 MED ORDER — PANTOPRAZOLE SODIUM 40 MG PO TBEC: 40.0000 mg | DELAYED_RELEASE_TABLET | Freq: Two times a day (BID) | ORAL | Status: AC

## 2016-03-16 MED ORDER — PRO-STAT SUGAR FREE PO LIQD: 30.0000 mL | Freq: Two times a day (BID) | ORAL | Status: DC

## 2016-03-16 NOTE — Discharge Summary (Addendum)
Physician Discharge Summary  Olivia Mullen [id number] DOB: Oct 29, 1935 DOA: 03/11/2016  PCP: [name], MD  Admit date: 03/11/2016 Discharge date: 03/16/2016  Time spent: 35 minutes  Recommendations for Outpatient Follow-up:  1. Follow-up with GI as an outpatient for 6 weeks. 2. She'll go to skilled nursing facility [facility]   Discharge Diagnoses:  Principal Problem:   Acute blood loss anemia Active Problems:   DM type 2 (diabetes mellitus, type 2) (HCC)   Hypertension   Chronic systolic heart failure (HCC)   Gastric ulcer with hemorrhage   Discharge Condition: stable  Diet recommendation: regular  Filed Weights   03/14/16 0542 03/15/16 0617 03/16/16 [id number]  Weight: 70.625 kg (155 lb 11.2 oz) 70.444 kg (155 lb 4.8 oz) 70.8 kg (156 lb 1.4 oz)    History of present illness:  80 year old with past medical history of chronic systolic heart failure collagenous cholangitis on prednisone with past medical history of NSAID use that comes in complaining of black tarry stools.  Hospital Course:  Acute upper GI bleed due to peptic ulcer disease/acute blood loss anemia: She was started on IV Protonix drip on admission GI was consulted and EGD was done that showed esophagitis gastritis and a nonbleeding gastric ulcer with visible vessel which was treated. She was kept on IV Protonix for 3 days, her diet was advanced and tolerated, she had to be transfused 1 unit of packed red blood cells while she was able to tolerate diet and no further melanotic stools with a stable hemoglobin and she was discharged to skilled nursing facility, as recommended by physical therapy evaluation.  History of collagenous cholangitis: She was continued on her steroids no changes were made.  Chronic systolic heart failure: No changes were made to her medication.  Procedures:  EGD 5.23.2017  Consultations:  GI  Discharge Exam: Filed Vitals:   03/15/16 2159 03/16/16 0608  BP: 170/73  162/62  Pulse: 80 73  Temp: 98.2 F (36.8 C) 97.8 F (36.6 C)  Resp: 18 18    General: A&O x3 Cardiovascular: RRR Respiratory: good air movement CTA B/L  Discharge Instructions   Discharge Instructions    Diet - low sodium heart healthy    Complete by:  As directed      Increase activity slowly    Complete by:  As directed           Current Discharge Medication List    START taking these medications   Details  Amino Acids-Protein Hydrolys (FEEDING SUPPLEMENT, PRO-STAT SUGAR FREE 64,) LIQD Take 30 mLs by mouth 2 (two) times daily. Qty: 900 mL, Refills: 0    pantoprazole (PROTONIX) 40 MG tablet Take 1 tablet (40 mg total) by mouth 2 (two) times daily. Qty: 60 tablet, Refills: 3      CONTINUE these medications which have NOT CHANGED   Details  carvedilol (COREG) 6.25 MG tablet TAKE 1 TABLET (6.25 MG TOTAL) BY MOUTH 2 (TWO) TIMES DAILY WITH A MEAL. Qty: 60 tablet, Refills: 10    cholecalciferol (VITAMIN D) 1000 UNITS tablet Take 2,000 Units by mouth daily.     clonazePAM (KLONOPIN) 0.5 MG tablet Take 1 mg by mouth at bedtime.     doxepin (SINEQUAN) 25 MG capsule Take 50 mg by mouth at bedtime.    DULoxetine (CYMBALTA) 60 MG capsule Take 120 mg by mouth daily.     folic acid (FOLVITE) 1 MG tablet Take 1 mg by mouth daily.     furosemide (LASIX) 20 MG tablet  Take 1 tablet (20 mg total) by mouth daily. PLEASE CONTACT OFFICE FOR ADDITIONAL REFILLS Qty: 30 tablet, Refills: 1    glycopyrrolate (ROBINUL) 2 MG tablet Take 2 mg by mouth 2 (two) times daily. Refills: 6    HUMIRA PEN 40 MG/0.8ML injection 1 injection every other week    leflunomide (ARAVA) 20 MG tablet Take 20 mg by mouth daily.    levothyroxine (SYNTHROID, LEVOTHROID) 75 MCG tablet Take 75 mcg by mouth daily before breakfast.    losartan (COZAAR) 25 MG tablet Take 1 tablet (25 mg total) by mouth daily. Qty: 30 tablet, Refills: 11    Multiple Vitamins-Minerals (ADULT GUMMY PO) Take 2 tablets by mouth  daily.    predniSONE (DELTASONE) 5 MG tablet Take 10-20 mg by mouth 2 (two) times daily. 10 mgs in the morning, 20mg s at noon daily    Probiotic Product (PROBIOTIC DAILY PO) Take 2 tablets by mouth daily.     simvastatin (ZOCOR) 10 MG tablet Take 10 mg by mouth daily.    traMADol (ULTRAM) 50 MG tablet Take 50 mg by mouth. Take 50 mgs at lunch, 50mg s in the evening and 50mg s as needed for pian Refills: 0    KLOR-CON M20 20 MEQ tablet TAKE 1 TABLET (20 MEQ TOTAL) BY MOUTH 2 (TWO) TIMES DAILY. Qty: 60 tablet, Refills: 10      STOP taking these medications     aspirin-acetaminophen-caffeine (EXCEDRIN MIGRAINE) 250-250-65 MG per tablet      ibuprofen (ADVIL,MOTRIN) 200 MG tablet      rizatriptan (MAXALT) 10 MG tablet        Allergies  Allergen Reactions  . Gabapentin Itching  . Isosorbide Nitrate Itching  . Wellbutrin [Bupropion] Other (See Comments)    mental changes and increase anger  . Zonisamide Itching      The results of significant diagnostics from this hospitalization (including imaging, microbiology, ancillary and laboratory) are listed below for reference.    Significant Diagnostic Studies: Dg Chest Portable 1 View  03/11/2016  CLINICAL DATA:  Decreased oral intake and weakness for 1 week EXAM: PORTABLE CHEST 1 VIEW COMPARISON:  10/05/2013 FINDINGS: Cardiac silhouette upper normal. Stable calcification and uncoiling of the aorta. Vascular pattern normal. Lungs clear. Left shoulder replacement noted. IMPRESSION: No active disease. Electronically Signed   By: Skipper Cliche M.D.   On: 03/11/2016 16:23    Microbiology: No results found for this or any previous visit (from the past 240 hour(s)).   Labs: Basic Metabolic Panel:  Recent Labs Lab 03/11/16 1531 03/11/16 1603 03/12/16 0844 03/13/16 0524  NA 140 138 141 142  K 2.6* 2.6* 3.7 3.8  CL 105 104 111 112*  CO2 26 26 26 24   GLUCOSE 172* 172* 118* 97  BUN 39* 38* 29* 25*  CREATININE 0.97 1.01* 0.95  0.84  CALCIUM 7.5* 7.5* 7.2* 7.2*   Liver Function Tests:  Recent Labs Lab 03/11/16 1603  AST 31  ALT 29  ALKPHOS 55  BILITOT 0.9  PROT 4.7*  ALBUMIN 2.9*   No results for input(s): LIPASE, AMYLASE in the last 168 hours. No results for input(s): AMMONIA in the last 168 hours. CBC:  Recent Labs Lab 03/11/16 1531 03/12/16 0844 03/12/16 1830 03/13/16 0524 03/14/16 0444  WBC 11.7* 8.7 13.0* 7.8 7.3  NEUTROABS 8.7*  --   --   --   --   HGB 6.3* 9.0* 9.5* 8.3* 8.6*  HCT 20.2* 27.4* 28.6* 24.9* 25.9*  MCV 94.0 91.0 88.8 91.2 92.5  PLT 171 125* 129* 120* 128*   Cardiac Enzymes: No results for input(s): CKTOTAL, CKMB, CKMBINDEX, TROPONINI in the last 168 hours. BNP: BNP (last 3 results)  Recent Labs  03/11/16 1550  BNP 60.9    ProBNP (last 3 results) No results for input(s): PROBNP in the last 8760 hours.  CBG:  Recent Labs Lab 03/11/16 1522  GLUCAP 148*    Signed:  Charlynne Cousins MD.  [facility] 03/16/2016, 9:05 AM

## 2016-03-16 NOTE — Clinical Social Work Placement (Signed)
   CLINICAL SOCIAL WORK PLACEMENT  NOTE 03/16/16 - DISCHARGED TO [facility]  Date:  03/16/2016  Patient Details  Name: Olivia Mullen MRN: [id number] Date of Birth: 1935-11-14  Clinical Social Work is seeking post-discharge placement for this patient at the [place] level of care (*CSW will initial, date and re-position this form in  chart as items are completed):  Yes   Patient/family provided with [facility] [place] Work Department's list of facilities offering this level of care within the geographic area requested by the patient (or if unable, by the patient's family).  Yes   Patient/family informed of their freedom to choose among providers that offer the needed level of care, that participate in Medicare, Medicaid or managed care program needed by the patient, have an available bed and are willing to accept the patient.  Yes   Patient/family informed of [facility]'s ownership interest in [facility] and [facility], as well as of the fact that they are under no obligation to receive care at these facilities.  PASRR submitted to EDS on 03/13/16     PASRR number received on 03/13/16     Existing PASRR number confirmed on       FL2 transmitted to all facilities in geographic area requested by pt/family on 03/13/16     FL2 transmitted to all facilities within larger geographic area on       Patient informed that his/her managed care company has contracts with or will negotiate with certain facilities, including the following:        Yes   Patient/family informed of bed offers received.  Patient chooses bed at [facility]     Physician recommends and patient chooses bed at      Patient to be transferred to [facility] on  03/16/16.  Patient to be transferred to facility by  ambulance     Patient family notified on  03/16/16 of transfer.  Name of family member notified:   Brycelynn, Empey  [phone])     PHYSICIAN       Additional Comment:    _______________________________________________ Sable Feil, LCSW 03/16/2016, 11:38 AM

## 2016-03-16 NOTE — Progress Notes (Signed)
Chaplain visit the result of a Spiritual Consult dated 03/11/2016.  Ms Dew is in a deep spiritual crisis. Her spiritual pain stems from the death of her husband in [place], after a marriage of over 34 years. The death, she relates, was caused by her husband being discharged from the hospital too early and upon return, his condition to acute to prevent his death. Her grief is profound. At present she is being discharged for placement in a nursing facility, which she fears, as she reports she has had friends who were ill cared for and died in the facility. She longs to return to her home, near the memories of her beloved husband.  Ms Haneline has lost all hope of happiness and comfort. She is angry with God and angry at God's people for "abandoning" her when she needs God and God's people most. She was reared in a [employer] home, but converted to [employer] when she married her husband. She was very happy in her church until "God took her husband before his time." She is still deeply religious, having requested her parish to send someone to give her a blessing and give her religious counsel. The chaplain called her parish again requesting a visit, leaving the nurse's telephone number to ensure any news of discharge is given the priest before comes.  Please page the chaplain immediately if Ms Ibsen needs or requests spiritual/emotional care of any kind.  Sallee Lange. Sumie Remsen, DMin Weekend Chaplain

## 2016-03-16 NOTE — Progress Notes (Signed)
Report called to [facility] and talked with Libby Maw

## 2016-03-22 DIAGNOSIS — K922 Gastrointestinal hemorrhage, unspecified: Secondary | ICD-10-CM | POA: Diagnosis not present

## 2016-03-22 DIAGNOSIS — I10 Essential (primary) hypertension: Secondary | ICD-10-CM | POA: Diagnosis not present

## 2016-03-22 DIAGNOSIS — K279 Peptic ulcer, site unspecified, unspecified as acute or chronic, without hemorrhage or perforation: Secondary | ICD-10-CM | POA: Diagnosis not present

## 2016-03-22 DIAGNOSIS — E119 Type 2 diabetes mellitus without complications: Secondary | ICD-10-CM | POA: Diagnosis not present

## 2016-03-22 DIAGNOSIS — K52831 Collagenous colitis: Secondary | ICD-10-CM | POA: Diagnosis not present

## 2016-04-05 DIAGNOSIS — F322 Major depressive disorder, single episode, severe without psychotic features: Secondary | ICD-10-CM | POA: Diagnosis not present

## 2016-04-05 DIAGNOSIS — F411 Generalized anxiety disorder: Secondary | ICD-10-CM | POA: Diagnosis not present

## 2016-04-09 ENCOUNTER — Other Ambulatory Visit: Payer: Self-pay | Admitting: Interventional Cardiology

## 2016-04-09 DIAGNOSIS — F332 Major depressive disorder, recurrent severe without psychotic features: Secondary | ICD-10-CM | POA: Diagnosis not present

## 2016-04-09 DIAGNOSIS — F064 Anxiety disorder due to known physiological condition: Secondary | ICD-10-CM | POA: Diagnosis not present

## 2016-04-12 DIAGNOSIS — K922 Gastrointestinal hemorrhage, unspecified: Secondary | ICD-10-CM | POA: Diagnosis not present

## 2016-04-12 DIAGNOSIS — F322 Major depressive disorder, single episode, severe without psychotic features: Secondary | ICD-10-CM | POA: Diagnosis not present

## 2016-04-12 DIAGNOSIS — I10 Essential (primary) hypertension: Secondary | ICD-10-CM | POA: Diagnosis not present

## 2016-04-12 DIAGNOSIS — I509 Heart failure, unspecified: Secondary | ICD-10-CM | POA: Diagnosis not present

## 2016-04-12 DIAGNOSIS — K279 Peptic ulcer, site unspecified, unspecified as acute or chronic, without hemorrhage or perforation: Secondary | ICD-10-CM | POA: Diagnosis not present

## 2016-04-12 DIAGNOSIS — F0391 Unspecified dementia with behavioral disturbance: Secondary | ICD-10-CM | POA: Diagnosis not present

## 2016-04-26 DIAGNOSIS — I110 Hypertensive heart disease with heart failure: Secondary | ICD-10-CM | POA: Diagnosis not present

## 2016-04-26 DIAGNOSIS — F329 Major depressive disorder, single episode, unspecified: Secondary | ICD-10-CM | POA: Diagnosis not present

## 2016-04-26 DIAGNOSIS — D500 Iron deficiency anemia secondary to blood loss (chronic): Secondary | ICD-10-CM | POA: Diagnosis not present

## 2016-04-26 DIAGNOSIS — I5022 Chronic systolic (congestive) heart failure: Secondary | ICD-10-CM | POA: Diagnosis not present

## 2016-04-26 DIAGNOSIS — E1165 Type 2 diabetes mellitus with hyperglycemia: Secondary | ICD-10-CM | POA: Diagnosis not present

## 2016-04-26 DIAGNOSIS — K279 Peptic ulcer, site unspecified, unspecified as acute or chronic, without hemorrhage or perforation: Secondary | ICD-10-CM | POA: Diagnosis not present

## 2016-04-30 DIAGNOSIS — E1165 Type 2 diabetes mellitus with hyperglycemia: Secondary | ICD-10-CM | POA: Diagnosis not present

## 2016-04-30 DIAGNOSIS — D500 Iron deficiency anemia secondary to blood loss (chronic): Secondary | ICD-10-CM | POA: Diagnosis not present

## 2016-04-30 DIAGNOSIS — I5022 Chronic systolic (congestive) heart failure: Secondary | ICD-10-CM | POA: Diagnosis not present

## 2016-04-30 DIAGNOSIS — F329 Major depressive disorder, single episode, unspecified: Secondary | ICD-10-CM | POA: Diagnosis not present

## 2016-04-30 DIAGNOSIS — K279 Peptic ulcer, site unspecified, unspecified as acute or chronic, without hemorrhage or perforation: Secondary | ICD-10-CM | POA: Diagnosis not present

## 2016-04-30 DIAGNOSIS — I110 Hypertensive heart disease with heart failure: Secondary | ICD-10-CM | POA: Diagnosis not present

## 2016-05-01 DIAGNOSIS — K279 Peptic ulcer, site unspecified, unspecified as acute or chronic, without hemorrhage or perforation: Secondary | ICD-10-CM | POA: Diagnosis not present

## 2016-05-01 DIAGNOSIS — E1165 Type 2 diabetes mellitus with hyperglycemia: Secondary | ICD-10-CM | POA: Diagnosis not present

## 2016-05-01 DIAGNOSIS — I110 Hypertensive heart disease with heart failure: Secondary | ICD-10-CM | POA: Diagnosis not present

## 2016-05-01 DIAGNOSIS — I5022 Chronic systolic (congestive) heart failure: Secondary | ICD-10-CM | POA: Diagnosis not present

## 2016-05-01 DIAGNOSIS — F329 Major depressive disorder, single episode, unspecified: Secondary | ICD-10-CM | POA: Diagnosis not present

## 2016-05-01 DIAGNOSIS — D500 Iron deficiency anemia secondary to blood loss (chronic): Secondary | ICD-10-CM | POA: Diagnosis not present

## 2016-05-06 DIAGNOSIS — I110 Hypertensive heart disease with heart failure: Secondary | ICD-10-CM | POA: Diagnosis not present

## 2016-05-06 DIAGNOSIS — F329 Major depressive disorder, single episode, unspecified: Secondary | ICD-10-CM | POA: Diagnosis not present

## 2016-05-06 DIAGNOSIS — I5022 Chronic systolic (congestive) heart failure: Secondary | ICD-10-CM | POA: Diagnosis not present

## 2016-05-06 DIAGNOSIS — E1165 Type 2 diabetes mellitus with hyperglycemia: Secondary | ICD-10-CM | POA: Diagnosis not present

## 2016-05-06 DIAGNOSIS — D500 Iron deficiency anemia secondary to blood loss (chronic): Secondary | ICD-10-CM | POA: Diagnosis not present

## 2016-05-06 DIAGNOSIS — K279 Peptic ulcer, site unspecified, unspecified as acute or chronic, without hemorrhage or perforation: Secondary | ICD-10-CM | POA: Diagnosis not present

## 2016-05-07 DIAGNOSIS — D500 Iron deficiency anemia secondary to blood loss (chronic): Secondary | ICD-10-CM | POA: Diagnosis not present

## 2016-05-07 DIAGNOSIS — F329 Major depressive disorder, single episode, unspecified: Secondary | ICD-10-CM | POA: Diagnosis not present

## 2016-05-07 DIAGNOSIS — I110 Hypertensive heart disease with heart failure: Secondary | ICD-10-CM | POA: Diagnosis not present

## 2016-05-07 DIAGNOSIS — E1165 Type 2 diabetes mellitus with hyperglycemia: Secondary | ICD-10-CM | POA: Diagnosis not present

## 2016-05-07 DIAGNOSIS — I5022 Chronic systolic (congestive) heart failure: Secondary | ICD-10-CM | POA: Diagnosis not present

## 2016-05-07 DIAGNOSIS — K279 Peptic ulcer, site unspecified, unspecified as acute or chronic, without hemorrhage or perforation: Secondary | ICD-10-CM | POA: Diagnosis not present

## 2016-05-08 DIAGNOSIS — D500 Iron deficiency anemia secondary to blood loss (chronic): Secondary | ICD-10-CM | POA: Diagnosis not present

## 2016-05-08 DIAGNOSIS — I5022 Chronic systolic (congestive) heart failure: Secondary | ICD-10-CM | POA: Diagnosis not present

## 2016-05-08 DIAGNOSIS — E1165 Type 2 diabetes mellitus with hyperglycemia: Secondary | ICD-10-CM | POA: Diagnosis not present

## 2016-05-08 DIAGNOSIS — K279 Peptic ulcer, site unspecified, unspecified as acute or chronic, without hemorrhage or perforation: Secondary | ICD-10-CM | POA: Diagnosis not present

## 2016-05-08 DIAGNOSIS — F329 Major depressive disorder, single episode, unspecified: Secondary | ICD-10-CM | POA: Diagnosis not present

## 2016-05-08 DIAGNOSIS — I110 Hypertensive heart disease with heart failure: Secondary | ICD-10-CM | POA: Diagnosis not present

## 2016-05-09 DIAGNOSIS — K279 Peptic ulcer, site unspecified, unspecified as acute or chronic, without hemorrhage or perforation: Secondary | ICD-10-CM | POA: Diagnosis not present

## 2016-05-09 DIAGNOSIS — D500 Iron deficiency anemia secondary to blood loss (chronic): Secondary | ICD-10-CM | POA: Diagnosis not present

## 2016-05-09 DIAGNOSIS — I110 Hypertensive heart disease with heart failure: Secondary | ICD-10-CM | POA: Diagnosis not present

## 2016-05-09 DIAGNOSIS — F329 Major depressive disorder, single episode, unspecified: Secondary | ICD-10-CM | POA: Diagnosis not present

## 2016-05-09 DIAGNOSIS — E1165 Type 2 diabetes mellitus with hyperglycemia: Secondary | ICD-10-CM | POA: Diagnosis not present

## 2016-05-09 DIAGNOSIS — I5022 Chronic systolic (congestive) heart failure: Secondary | ICD-10-CM | POA: Diagnosis not present

## 2016-05-10 DIAGNOSIS — I5022 Chronic systolic (congestive) heart failure: Secondary | ICD-10-CM | POA: Diagnosis not present

## 2016-05-10 DIAGNOSIS — F329 Major depressive disorder, single episode, unspecified: Secondary | ICD-10-CM | POA: Diagnosis not present

## 2016-05-10 DIAGNOSIS — K279 Peptic ulcer, site unspecified, unspecified as acute or chronic, without hemorrhage or perforation: Secondary | ICD-10-CM | POA: Diagnosis not present

## 2016-05-10 DIAGNOSIS — I110 Hypertensive heart disease with heart failure: Secondary | ICD-10-CM | POA: Diagnosis not present

## 2016-05-10 DIAGNOSIS — D500 Iron deficiency anemia secondary to blood loss (chronic): Secondary | ICD-10-CM | POA: Diagnosis not present

## 2016-05-10 DIAGNOSIS — E1165 Type 2 diabetes mellitus with hyperglycemia: Secondary | ICD-10-CM | POA: Diagnosis not present

## 2016-05-13 DIAGNOSIS — I5022 Chronic systolic (congestive) heart failure: Secondary | ICD-10-CM | POA: Diagnosis not present

## 2016-05-13 DIAGNOSIS — F329 Major depressive disorder, single episode, unspecified: Secondary | ICD-10-CM | POA: Diagnosis not present

## 2016-05-13 DIAGNOSIS — D500 Iron deficiency anemia secondary to blood loss (chronic): Secondary | ICD-10-CM | POA: Diagnosis not present

## 2016-05-13 DIAGNOSIS — E1165 Type 2 diabetes mellitus with hyperglycemia: Secondary | ICD-10-CM | POA: Diagnosis not present

## 2016-05-13 DIAGNOSIS — K279 Peptic ulcer, site unspecified, unspecified as acute or chronic, without hemorrhage or perforation: Secondary | ICD-10-CM | POA: Diagnosis not present

## 2016-05-13 DIAGNOSIS — I110 Hypertensive heart disease with heart failure: Secondary | ICD-10-CM | POA: Diagnosis not present

## 2016-05-14 DIAGNOSIS — K279 Peptic ulcer, site unspecified, unspecified as acute or chronic, without hemorrhage or perforation: Secondary | ICD-10-CM | POA: Diagnosis not present

## 2016-05-14 DIAGNOSIS — D500 Iron deficiency anemia secondary to blood loss (chronic): Secondary | ICD-10-CM | POA: Diagnosis not present

## 2016-05-14 DIAGNOSIS — I5022 Chronic systolic (congestive) heart failure: Secondary | ICD-10-CM | POA: Diagnosis not present

## 2016-05-14 DIAGNOSIS — F329 Major depressive disorder, single episode, unspecified: Secondary | ICD-10-CM | POA: Diagnosis not present

## 2016-05-14 DIAGNOSIS — I110 Hypertensive heart disease with heart failure: Secondary | ICD-10-CM | POA: Diagnosis not present

## 2016-05-14 DIAGNOSIS — E1165 Type 2 diabetes mellitus with hyperglycemia: Secondary | ICD-10-CM | POA: Diagnosis not present

## 2016-05-16 DIAGNOSIS — K279 Peptic ulcer, site unspecified, unspecified as acute or chronic, without hemorrhage or perforation: Secondary | ICD-10-CM | POA: Diagnosis not present

## 2016-05-16 DIAGNOSIS — I110 Hypertensive heart disease with heart failure: Secondary | ICD-10-CM | POA: Diagnosis not present

## 2016-05-16 DIAGNOSIS — E1165 Type 2 diabetes mellitus with hyperglycemia: Secondary | ICD-10-CM | POA: Diagnosis not present

## 2016-05-16 DIAGNOSIS — D500 Iron deficiency anemia secondary to blood loss (chronic): Secondary | ICD-10-CM | POA: Diagnosis not present

## 2016-05-16 DIAGNOSIS — F329 Major depressive disorder, single episode, unspecified: Secondary | ICD-10-CM | POA: Diagnosis not present

## 2016-05-16 DIAGNOSIS — I5022 Chronic systolic (congestive) heart failure: Secondary | ICD-10-CM | POA: Diagnosis not present

## 2016-05-17 DIAGNOSIS — K279 Peptic ulcer, site unspecified, unspecified as acute or chronic, without hemorrhage or perforation: Secondary | ICD-10-CM | POA: Diagnosis not present

## 2016-05-17 DIAGNOSIS — E1165 Type 2 diabetes mellitus with hyperglycemia: Secondary | ICD-10-CM | POA: Diagnosis not present

## 2016-05-17 DIAGNOSIS — D500 Iron deficiency anemia secondary to blood loss (chronic): Secondary | ICD-10-CM | POA: Diagnosis not present

## 2016-05-17 DIAGNOSIS — F329 Major depressive disorder, single episode, unspecified: Secondary | ICD-10-CM | POA: Diagnosis not present

## 2016-05-17 DIAGNOSIS — I110 Hypertensive heart disease with heart failure: Secondary | ICD-10-CM | POA: Diagnosis not present

## 2016-05-17 DIAGNOSIS — I5022 Chronic systolic (congestive) heart failure: Secondary | ICD-10-CM | POA: Diagnosis not present

## 2016-05-22 DIAGNOSIS — I110 Hypertensive heart disease with heart failure: Secondary | ICD-10-CM | POA: Diagnosis not present

## 2016-05-22 DIAGNOSIS — D500 Iron deficiency anemia secondary to blood loss (chronic): Secondary | ICD-10-CM | POA: Diagnosis not present

## 2016-05-22 DIAGNOSIS — I5022 Chronic systolic (congestive) heart failure: Secondary | ICD-10-CM | POA: Diagnosis not present

## 2016-05-22 DIAGNOSIS — E1165 Type 2 diabetes mellitus with hyperglycemia: Secondary | ICD-10-CM | POA: Diagnosis not present

## 2016-05-22 DIAGNOSIS — K279 Peptic ulcer, site unspecified, unspecified as acute or chronic, without hemorrhage or perforation: Secondary | ICD-10-CM | POA: Diagnosis not present

## 2016-05-22 DIAGNOSIS — F329 Major depressive disorder, single episode, unspecified: Secondary | ICD-10-CM | POA: Diagnosis not present

## 2016-05-23 DIAGNOSIS — I110 Hypertensive heart disease with heart failure: Secondary | ICD-10-CM | POA: Diagnosis not present

## 2016-05-23 DIAGNOSIS — F329 Major depressive disorder, single episode, unspecified: Secondary | ICD-10-CM | POA: Diagnosis not present

## 2016-05-23 DIAGNOSIS — E1165 Type 2 diabetes mellitus with hyperglycemia: Secondary | ICD-10-CM | POA: Diagnosis not present

## 2016-05-23 DIAGNOSIS — K279 Peptic ulcer, site unspecified, unspecified as acute or chronic, without hemorrhage or perforation: Secondary | ICD-10-CM | POA: Diagnosis not present

## 2016-05-23 DIAGNOSIS — I5022 Chronic systolic (congestive) heart failure: Secondary | ICD-10-CM | POA: Diagnosis not present

## 2016-05-23 DIAGNOSIS — D500 Iron deficiency anemia secondary to blood loss (chronic): Secondary | ICD-10-CM | POA: Diagnosis not present

## 2016-05-27 DIAGNOSIS — I5022 Chronic systolic (congestive) heart failure: Secondary | ICD-10-CM | POA: Diagnosis not present

## 2016-05-27 DIAGNOSIS — E1165 Type 2 diabetes mellitus with hyperglycemia: Secondary | ICD-10-CM | POA: Diagnosis not present

## 2016-05-27 DIAGNOSIS — D500 Iron deficiency anemia secondary to blood loss (chronic): Secondary | ICD-10-CM | POA: Diagnosis not present

## 2016-05-27 DIAGNOSIS — K279 Peptic ulcer, site unspecified, unspecified as acute or chronic, without hemorrhage or perforation: Secondary | ICD-10-CM | POA: Diagnosis not present

## 2016-05-27 DIAGNOSIS — I110 Hypertensive heart disease with heart failure: Secondary | ICD-10-CM | POA: Diagnosis not present

## 2016-05-27 DIAGNOSIS — F329 Major depressive disorder, single episode, unspecified: Secondary | ICD-10-CM | POA: Diagnosis not present

## 2016-05-28 DIAGNOSIS — F329 Major depressive disorder, single episode, unspecified: Secondary | ICD-10-CM | POA: Diagnosis not present

## 2016-05-28 DIAGNOSIS — D500 Iron deficiency anemia secondary to blood loss (chronic): Secondary | ICD-10-CM | POA: Diagnosis not present

## 2016-05-28 DIAGNOSIS — I5022 Chronic systolic (congestive) heart failure: Secondary | ICD-10-CM | POA: Diagnosis not present

## 2016-05-28 DIAGNOSIS — K279 Peptic ulcer, site unspecified, unspecified as acute or chronic, without hemorrhage or perforation: Secondary | ICD-10-CM | POA: Diagnosis not present

## 2016-05-28 DIAGNOSIS — I110 Hypertensive heart disease with heart failure: Secondary | ICD-10-CM | POA: Diagnosis not present

## 2016-05-28 DIAGNOSIS — E1165 Type 2 diabetes mellitus with hyperglycemia: Secondary | ICD-10-CM | POA: Diagnosis not present

## 2016-05-30 ENCOUNTER — Ambulatory Visit: Payer: Self-pay

## 2016-05-30 DIAGNOSIS — E1165 Type 2 diabetes mellitus with hyperglycemia: Secondary | ICD-10-CM | POA: Diagnosis not present

## 2016-05-30 DIAGNOSIS — K279 Peptic ulcer, site unspecified, unspecified as acute or chronic, without hemorrhage or perforation: Secondary | ICD-10-CM | POA: Diagnosis not present

## 2016-05-30 DIAGNOSIS — I110 Hypertensive heart disease with heart failure: Secondary | ICD-10-CM | POA: Diagnosis not present

## 2016-05-30 DIAGNOSIS — D500 Iron deficiency anemia secondary to blood loss (chronic): Secondary | ICD-10-CM | POA: Diagnosis not present

## 2016-05-30 DIAGNOSIS — F329 Major depressive disorder, single episode, unspecified: Secondary | ICD-10-CM | POA: Diagnosis not present

## 2016-05-30 DIAGNOSIS — I5022 Chronic systolic (congestive) heart failure: Secondary | ICD-10-CM | POA: Diagnosis not present

## 2016-06-03 ENCOUNTER — Ambulatory Visit (INDEPENDENT_AMBULATORY_CARE_PROVIDER_SITE_OTHER): Payer: Medicare Other | Admitting: Physician Assistant

## 2016-06-03 ENCOUNTER — Ambulatory Visit (INDEPENDENT_AMBULATORY_CARE_PROVIDER_SITE_OTHER): Payer: Medicare Other

## 2016-06-03 VITALS — BP 148/76 | HR 111 | Temp 99.0°F | Resp 16 | Wt 149.0 lb

## 2016-06-03 DIAGNOSIS — W19XXXA Unspecified fall, initial encounter: Secondary | ICD-10-CM

## 2016-06-03 DIAGNOSIS — S6991XA Unspecified injury of right wrist, hand and finger(s), initial encounter: Secondary | ICD-10-CM | POA: Diagnosis not present

## 2016-06-03 DIAGNOSIS — M79641 Pain in right hand: Secondary | ICD-10-CM | POA: Diagnosis not present

## 2016-06-03 NOTE — Progress Notes (Signed)
06/03/2016 3:20 PM   DOB: May 13, 1936 / MRN: [id number]  SUBJECTIVE:  Olivia Mullen is a 80 y.o. female presenting for a right hand injury that occurred 4 days ago.  Reports she "my knees buckled and I hurt my hand while trying to reach for something to break my fall." Reports to me that she did NOT fall on an outstretched hand. Complains of swelling and pain about the mediodorasl aspect of the hand.  She can move all of her fingers however this is painful for her.   She is allergic to gabapentin; isosorbide nitrate; wellbutrin [bupropion]; and zonisamide.   She  has a past medical history of Anemia; Anginal pain ([place]); Anxiety; Cancer [facility]); CHF (congestive heart failure) ([place]); Chronic systolic heart failure ([place]); Colitis, collagenous; Complication of anesthesia ("long time ago"); Depression; Headache(784.0); History of kidney stones (long time ago); Hypercholesteremia; Hypertension; Hypothyroidism (acquired) (12/16/2011); Multinodular goiter (nontoxic) (12/16/2011); Normochromic normocytic anemia (12/16/2011); Pneumonia; Psoriasis; Rheumatoid arthritis(714.0) (12/16/2011); and UTI (lower urinary tract infection).    She  reports that she quit smoking about 23 years ago. Her smoking use included Cigarettes. She smoked 3.00 packs per day. She has never used smokeless tobacco. She reports that she drinks alcohol. She reports that she does not use drugs. She  reports that she does not engage in sexual activity. The patient  has a past surgical history that includes Mastectomy; Carpal tunnel release (Right); Thyroid surgery; Abdominal hysterectomy; left shoulder; Cholecystectomy; Skin graft (Right); Appendectomy; Breast surgery (20 years ago); Cataract extraction (Bilateral); Tonsillectomy; Total hip arthroplasty (Right, 10/11/2013); and Esophagogastroduodenoscopy (egd) with propofol (N/A, 03/12/2016).  Her family history includes Healthy in her sister; Heart disease in her mother.  Review of Systems    Constitutional: Negative for fever.  Musculoskeletal: Positive for falls and joint pain. Negative for back pain, myalgias and neck pain.  Skin: Negative for rash.  Neurological: Negative for dizziness and headaches.    The problem list and medications were reviewed and updated by myself where necessary and exist elsewhere in the encounter.   OBJECTIVE:  BP (!) 148/76 (BP Location: Left Arm, Patient Position: Sitting, Cuff Size: Normal)   Pulse (!) 111   Temp 99 F (37.2 C) (Oral)   Resp 16   Wt 149 lb (67.6 kg)   SpO2 94%   BMI 27.25 kg/m   Physical Exam  Constitutional: She is oriented to person, place, and time. She appears well-developed and well-nourished.  Cardiovascular: Normal rate and regular rhythm.   Pulmonary/Chest: Effort normal and breath sounds normal.  Musculoskeletal:       Arms: Neurological: She is alert and oriented to person, place, and time.  Skin: Skin is warm and dry.  Psychiatric: She has a normal mood and affect.        No results found for this or any previous visit (from the past 72 hour(s)).  Dg Hand Complete Right  Result Date: 06/03/2016 CLINICAL DATA:  Fall 4 days ago, injuring posterior right hand. EXAM: RIGHT HAND - COMPLETE 3+ VIEW COMPARISON:  None. FINDINGS: Diffuse degenerative changes in the IP joints and right wrist with joint space narrowing. Joint space narrowing also noted in the MCP joints diffusely, most pronounced in the third MCP joint. No acute bony abnormality. Specifically, no fracture, subluxation, or dislocation. Soft tissues are intact. No bony erosions. IMPRESSION: Mild arthritic changes throughout the right hand. No acute bony abnormality. Electronically Signed   By: Rolm Baptise M.D.   On: 06/03/2016 15:16  ASSESSMENT AND PLAN  Quentasia was seen today for fall.  Diagnoses and all orders for this visit:  Right hand pain: Secondary to a fall.  She has some remarkable swelling about the hand however her rads are  negative for fracture.  She feels well aside for her hand pain.  Advised that if she is not better to return as needed or go to her PCP.  Tylenol, ace, and ICE per AVS.   Fall, initial encounter -     DG Hand Complete Right; Future    The patient is advised to call or return to clinic if she does not see an improvement in symptoms, or to seek the care of the closest emergency department if she worsens with the above plan.   Philis Fendt, MHS, PA-C [facility] 06/03/2016 3:20 PM

## 2016-06-03 NOTE — Patient Instructions (Addendum)
Please take 1000 mg of Tylenol every 8 hours for pain.  Keep the hand wrapped with the ace. Apply ice for 25 minutes then take off up to 5-6 times a day. Follow up here of with your PCP if you are not getting better.     IF you received an x-ray today, you will receive an invoice from [facility]. Please contact [facility] at [phone] with questions or concerns regarding your invoice.   IF you received labwork today, you will receive an invoice from [employer]. Please contact [facility] at [phone] with questions or concerns regarding your invoice.   Our billing staff will not be able to assist you with questions regarding bills from these companies.  You will be contacted with the lab results as soon as they are available. The fastest way to get your results is to activate your My Chart account. Instructions are located on the last page of this paperwork. If you have not heard from [place] regarding the results in 2 weeks, please contact this office.

## 2016-06-04 DIAGNOSIS — F329 Major depressive disorder, single episode, unspecified: Secondary | ICD-10-CM | POA: Diagnosis not present

## 2016-06-04 DIAGNOSIS — I110 Hypertensive heart disease with heart failure: Secondary | ICD-10-CM | POA: Diagnosis not present

## 2016-06-04 DIAGNOSIS — D500 Iron deficiency anemia secondary to blood loss (chronic): Secondary | ICD-10-CM | POA: Diagnosis not present

## 2016-06-04 DIAGNOSIS — K279 Peptic ulcer, site unspecified, unspecified as acute or chronic, without hemorrhage or perforation: Secondary | ICD-10-CM | POA: Diagnosis not present

## 2016-06-04 DIAGNOSIS — I5022 Chronic systolic (congestive) heart failure: Secondary | ICD-10-CM | POA: Diagnosis not present

## 2016-06-04 DIAGNOSIS — E1165 Type 2 diabetes mellitus with hyperglycemia: Secondary | ICD-10-CM | POA: Diagnosis not present

## 2016-06-06 DIAGNOSIS — K279 Peptic ulcer, site unspecified, unspecified as acute or chronic, without hemorrhage or perforation: Secondary | ICD-10-CM | POA: Diagnosis not present

## 2016-06-06 DIAGNOSIS — I110 Hypertensive heart disease with heart failure: Secondary | ICD-10-CM | POA: Diagnosis not present

## 2016-06-06 DIAGNOSIS — D500 Iron deficiency anemia secondary to blood loss (chronic): Secondary | ICD-10-CM | POA: Diagnosis not present

## 2016-06-06 DIAGNOSIS — F329 Major depressive disorder, single episode, unspecified: Secondary | ICD-10-CM | POA: Diagnosis not present

## 2016-06-06 DIAGNOSIS — E1165 Type 2 diabetes mellitus with hyperglycemia: Secondary | ICD-10-CM | POA: Diagnosis not present

## 2016-06-06 DIAGNOSIS — I5022 Chronic systolic (congestive) heart failure: Secondary | ICD-10-CM | POA: Diagnosis not present

## 2016-06-07 ENCOUNTER — Other Ambulatory Visit: Payer: Self-pay | Admitting: Interventional Cardiology

## 2016-06-07 DIAGNOSIS — D500 Iron deficiency anemia secondary to blood loss (chronic): Secondary | ICD-10-CM | POA: Diagnosis not present

## 2016-06-07 DIAGNOSIS — F329 Major depressive disorder, single episode, unspecified: Secondary | ICD-10-CM | POA: Diagnosis not present

## 2016-06-07 DIAGNOSIS — I110 Hypertensive heart disease with heart failure: Secondary | ICD-10-CM | POA: Diagnosis not present

## 2016-06-07 DIAGNOSIS — E1165 Type 2 diabetes mellitus with hyperglycemia: Secondary | ICD-10-CM | POA: Diagnosis not present

## 2016-06-07 DIAGNOSIS — I5022 Chronic systolic (congestive) heart failure: Secondary | ICD-10-CM | POA: Diagnosis not present

## 2016-06-07 DIAGNOSIS — K279 Peptic ulcer, site unspecified, unspecified as acute or chronic, without hemorrhage or perforation: Secondary | ICD-10-CM | POA: Diagnosis not present

## 2016-06-09 DIAGNOSIS — D500 Iron deficiency anemia secondary to blood loss (chronic): Secondary | ICD-10-CM | POA: Diagnosis not present

## 2016-06-09 DIAGNOSIS — F329 Major depressive disorder, single episode, unspecified: Secondary | ICD-10-CM | POA: Diagnosis not present

## 2016-06-09 DIAGNOSIS — I5022 Chronic systolic (congestive) heart failure: Secondary | ICD-10-CM | POA: Diagnosis not present

## 2016-06-09 DIAGNOSIS — E1165 Type 2 diabetes mellitus with hyperglycemia: Secondary | ICD-10-CM | POA: Diagnosis not present

## 2016-06-09 DIAGNOSIS — L02511 Cutaneous abscess of right hand: Secondary | ICD-10-CM | POA: Diagnosis not present

## 2016-06-09 DIAGNOSIS — I110 Hypertensive heart disease with heart failure: Secondary | ICD-10-CM | POA: Diagnosis not present

## 2016-06-09 DIAGNOSIS — K279 Peptic ulcer, site unspecified, unspecified as acute or chronic, without hemorrhage or perforation: Secondary | ICD-10-CM | POA: Diagnosis not present

## 2016-06-10 DIAGNOSIS — E1165 Type 2 diabetes mellitus with hyperglycemia: Secondary | ICD-10-CM | POA: Diagnosis not present

## 2016-06-10 DIAGNOSIS — I110 Hypertensive heart disease with heart failure: Secondary | ICD-10-CM | POA: Diagnosis not present

## 2016-06-10 DIAGNOSIS — D500 Iron deficiency anemia secondary to blood loss (chronic): Secondary | ICD-10-CM | POA: Diagnosis not present

## 2016-06-10 DIAGNOSIS — K279 Peptic ulcer, site unspecified, unspecified as acute or chronic, without hemorrhage or perforation: Secondary | ICD-10-CM | POA: Diagnosis not present

## 2016-06-10 DIAGNOSIS — F329 Major depressive disorder, single episode, unspecified: Secondary | ICD-10-CM | POA: Diagnosis not present

## 2016-06-10 DIAGNOSIS — I5022 Chronic systolic (congestive) heart failure: Secondary | ICD-10-CM | POA: Diagnosis not present

## 2016-06-11 DIAGNOSIS — E039 Hypothyroidism, unspecified: Secondary | ICD-10-CM | POA: Diagnosis not present

## 2016-06-11 DIAGNOSIS — E785 Hyperlipidemia, unspecified: Secondary | ICD-10-CM | POA: Diagnosis not present

## 2016-06-11 DIAGNOSIS — R634 Abnormal weight loss: Secondary | ICD-10-CM | POA: Diagnosis not present

## 2016-06-11 DIAGNOSIS — D500 Iron deficiency anemia secondary to blood loss (chronic): Secondary | ICD-10-CM | POA: Diagnosis not present

## 2016-06-11 DIAGNOSIS — E1165 Type 2 diabetes mellitus with hyperglycemia: Secondary | ICD-10-CM | POA: Diagnosis not present

## 2016-06-11 DIAGNOSIS — S61411A Laceration without foreign body of right hand, initial encounter: Secondary | ICD-10-CM | POA: Diagnosis not present

## 2016-06-11 DIAGNOSIS — K279 Peptic ulcer, site unspecified, unspecified as acute or chronic, without hemorrhage or perforation: Secondary | ICD-10-CM | POA: Diagnosis not present

## 2016-06-11 DIAGNOSIS — I10 Essential (primary) hypertension: Secondary | ICD-10-CM | POA: Diagnosis not present

## 2016-06-11 DIAGNOSIS — E119 Type 2 diabetes mellitus without complications: Secondary | ICD-10-CM | POA: Diagnosis not present

## 2016-06-11 DIAGNOSIS — F329 Major depressive disorder, single episode, unspecified: Secondary | ICD-10-CM | POA: Diagnosis not present

## 2016-06-11 DIAGNOSIS — I110 Hypertensive heart disease with heart failure: Secondary | ICD-10-CM | POA: Diagnosis not present

## 2016-06-11 DIAGNOSIS — I5022 Chronic systolic (congestive) heart failure: Secondary | ICD-10-CM | POA: Diagnosis not present

## 2016-06-12 DIAGNOSIS — E1165 Type 2 diabetes mellitus with hyperglycemia: Secondary | ICD-10-CM | POA: Diagnosis not present

## 2016-06-12 DIAGNOSIS — K279 Peptic ulcer, site unspecified, unspecified as acute or chronic, without hemorrhage or perforation: Secondary | ICD-10-CM | POA: Diagnosis not present

## 2016-06-12 DIAGNOSIS — I5022 Chronic systolic (congestive) heart failure: Secondary | ICD-10-CM | POA: Diagnosis not present

## 2016-06-12 DIAGNOSIS — D500 Iron deficiency anemia secondary to blood loss (chronic): Secondary | ICD-10-CM | POA: Diagnosis not present

## 2016-06-12 DIAGNOSIS — F329 Major depressive disorder, single episode, unspecified: Secondary | ICD-10-CM | POA: Diagnosis not present

## 2016-06-12 DIAGNOSIS — I110 Hypertensive heart disease with heart failure: Secondary | ICD-10-CM | POA: Diagnosis not present

## 2016-06-17 DIAGNOSIS — I110 Hypertensive heart disease with heart failure: Secondary | ICD-10-CM | POA: Diagnosis not present

## 2016-06-17 DIAGNOSIS — F329 Major depressive disorder, single episode, unspecified: Secondary | ICD-10-CM | POA: Diagnosis not present

## 2016-06-17 DIAGNOSIS — I5022 Chronic systolic (congestive) heart failure: Secondary | ICD-10-CM | POA: Diagnosis not present

## 2016-06-17 DIAGNOSIS — E1165 Type 2 diabetes mellitus with hyperglycemia: Secondary | ICD-10-CM | POA: Diagnosis not present

## 2016-06-17 DIAGNOSIS — D500 Iron deficiency anemia secondary to blood loss (chronic): Secondary | ICD-10-CM | POA: Diagnosis not present

## 2016-06-17 DIAGNOSIS — K279 Peptic ulcer, site unspecified, unspecified as acute or chronic, without hemorrhage or perforation: Secondary | ICD-10-CM | POA: Diagnosis not present

## 2016-06-21 DIAGNOSIS — K279 Peptic ulcer, site unspecified, unspecified as acute or chronic, without hemorrhage or perforation: Secondary | ICD-10-CM | POA: Diagnosis not present

## 2016-06-21 DIAGNOSIS — I5022 Chronic systolic (congestive) heart failure: Secondary | ICD-10-CM | POA: Diagnosis not present

## 2016-06-21 DIAGNOSIS — E1165 Type 2 diabetes mellitus with hyperglycemia: Secondary | ICD-10-CM | POA: Diagnosis not present

## 2016-06-21 DIAGNOSIS — F329 Major depressive disorder, single episode, unspecified: Secondary | ICD-10-CM | POA: Diagnosis not present

## 2016-06-21 DIAGNOSIS — D500 Iron deficiency anemia secondary to blood loss (chronic): Secondary | ICD-10-CM | POA: Diagnosis not present

## 2016-06-21 DIAGNOSIS — I110 Hypertensive heart disease with heart failure: Secondary | ICD-10-CM | POA: Diagnosis not present

## 2016-06-25 DIAGNOSIS — I110 Hypertensive heart disease with heart failure: Secondary | ICD-10-CM | POA: Diagnosis not present

## 2016-06-25 DIAGNOSIS — M79604 Pain in right leg: Secondary | ICD-10-CM | POA: Diagnosis not present

## 2016-06-25 DIAGNOSIS — S61401D Unspecified open wound of right hand, subsequent encounter: Secondary | ICD-10-CM | POA: Diagnosis not present

## 2016-06-25 DIAGNOSIS — I5022 Chronic systolic (congestive) heart failure: Secondary | ICD-10-CM | POA: Diagnosis not present

## 2016-06-25 DIAGNOSIS — F329 Major depressive disorder, single episode, unspecified: Secondary | ICD-10-CM | POA: Diagnosis not present

## 2016-06-25 DIAGNOSIS — E119 Type 2 diabetes mellitus without complications: Secondary | ICD-10-CM | POA: Diagnosis not present

## 2016-06-27 ENCOUNTER — Encounter (HOSPITAL_COMMUNITY): Payer: Self-pay | Admitting: Emergency Medicine

## 2016-06-27 ENCOUNTER — Inpatient Hospital Stay (HOSPITAL_COMMUNITY)
Admission: EM | Admit: 2016-06-27 | Discharge: 2016-06-30 | DRG: 682 | Disposition: A | Discharge status: Home Health | Payer: Medicare Other | Attending: Internal Medicine | Admitting: Internal Medicine

## 2016-06-27 DIAGNOSIS — E872 Acidosis: Secondary | ICD-10-CM | POA: Diagnosis present

## 2016-06-27 DIAGNOSIS — E860 Dehydration: Secondary | ICD-10-CM

## 2016-06-27 DIAGNOSIS — M79604 Pain in right leg: Secondary | ICD-10-CM | POA: Diagnosis not present

## 2016-06-27 DIAGNOSIS — R109 Unspecified abdominal pain: Secondary | ICD-10-CM

## 2016-06-27 DIAGNOSIS — F32A Depression, unspecified: Secondary | ICD-10-CM

## 2016-06-27 DIAGNOSIS — C50919 Malignant neoplasm of unspecified site of unspecified female breast: Secondary | ICD-10-CM | POA: Diagnosis present

## 2016-06-27 DIAGNOSIS — L409 Psoriasis, unspecified: Secondary | ICD-10-CM | POA: Diagnosis present

## 2016-06-27 DIAGNOSIS — F329 Major depressive disorder, single episode, unspecified: Secondary | ICD-10-CM | POA: Diagnosis not present

## 2016-06-27 DIAGNOSIS — Z888 Allergy status to other drugs, medicaments and biological substances status: Secondary | ICD-10-CM

## 2016-06-27 DIAGNOSIS — I5022 Chronic systolic (congestive) heart failure: Secondary | ICD-10-CM | POA: Diagnosis not present

## 2016-06-27 DIAGNOSIS — Z96641 Presence of right artificial hip joint: Secondary | ICD-10-CM | POA: Diagnosis present

## 2016-06-27 DIAGNOSIS — E43 Unspecified severe protein-calorie malnutrition: Secondary | ICD-10-CM | POA: Diagnosis not present

## 2016-06-27 DIAGNOSIS — Z9011 Acquired absence of right breast and nipple: Secondary | ICD-10-CM

## 2016-06-27 DIAGNOSIS — R197 Diarrhea, unspecified: Secondary | ICD-10-CM

## 2016-06-27 DIAGNOSIS — I110 Hypertensive heart disease with heart failure: Secondary | ICD-10-CM | POA: Diagnosis present

## 2016-06-27 DIAGNOSIS — E876 Hypokalemia: Secondary | ICD-10-CM

## 2016-06-27 DIAGNOSIS — Z79899 Other long term (current) drug therapy: Secondary | ICD-10-CM

## 2016-06-27 DIAGNOSIS — F419 Anxiety disorder, unspecified: Secondary | ICD-10-CM | POA: Diagnosis present

## 2016-06-27 DIAGNOSIS — K5190 Ulcerative colitis, unspecified, without complications: Secondary | ICD-10-CM | POA: Diagnosis present

## 2016-06-27 DIAGNOSIS — Z7952 Long term (current) use of systemic steroids: Secondary | ICD-10-CM

## 2016-06-27 DIAGNOSIS — E039 Hypothyroidism, unspecified: Secondary | ICD-10-CM | POA: Diagnosis present

## 2016-06-27 DIAGNOSIS — F319 Bipolar disorder, unspecified: Secondary | ICD-10-CM | POA: Diagnosis not present

## 2016-06-27 DIAGNOSIS — Z794 Long term (current) use of insulin: Secondary | ICD-10-CM

## 2016-06-27 DIAGNOSIS — Z87891 Personal history of nicotine dependence: Secondary | ICD-10-CM

## 2016-06-27 DIAGNOSIS — M069 Rheumatoid arthritis, unspecified: Secondary | ICD-10-CM | POA: Diagnosis present

## 2016-06-27 DIAGNOSIS — I5032 Chronic diastolic (congestive) heart failure: Secondary | ICD-10-CM | POA: Diagnosis present

## 2016-06-27 DIAGNOSIS — R5381 Other malaise: Secondary | ICD-10-CM

## 2016-06-27 DIAGNOSIS — S61401D Unspecified open wound of right hand, subsequent encounter: Secondary | ICD-10-CM | POA: Diagnosis not present

## 2016-06-27 DIAGNOSIS — E785 Hyperlipidemia, unspecified: Secondary | ICD-10-CM | POA: Diagnosis present

## 2016-06-27 DIAGNOSIS — E119 Type 2 diabetes mellitus without complications: Secondary | ICD-10-CM

## 2016-06-27 DIAGNOSIS — N179 Acute kidney failure, unspecified: Principal | ICD-10-CM | POA: Diagnosis present

## 2016-06-27 DIAGNOSIS — F23 Brief psychotic disorder: Secondary | ICD-10-CM | POA: Diagnosis not present

## 2016-06-27 LAB — CBC WITH DIFFERENTIAL/PLATELET
BASOS PCT: 0 %
Basophils Absolute: 0 10*3/uL (ref 0.0–0.1)
Eosinophils Absolute: 0 10*3/uL (ref 0.0–0.7)
Eosinophils Relative: 0 %
HEMATOCRIT: 31.9 % — AB (ref 36.0–46.0)
Hemoglobin: 10.5 g/dL — ABNORMAL LOW (ref 12.0–15.0)
LYMPHS ABS: 1.1 10*3/uL (ref 0.7–4.0)
Lymphocytes Relative: 13 %
MCH: 30.5 pg (ref 26.0–34.0)
MCHC: 32.9 g/dL (ref 30.0–36.0)
MCV: 92.7 fL (ref 78.0–100.0)
MONO ABS: 0.4 10*3/uL (ref 0.1–1.0)
MONOS PCT: 5 %
NEUTROS ABS: 6.5 10*3/uL (ref 1.7–7.7)
Neutrophils Relative %: 82 %
Platelets: 230 10*3/uL (ref 150–400)
RBC: 3.44 MIL/uL — ABNORMAL LOW (ref 3.87–5.11)
RDW: 16.7 % — AB (ref 11.5–15.5)
WBC: 8 10*3/uL (ref 4.0–10.5)

## 2016-06-27 LAB — COMPREHENSIVE METABOLIC PANEL
ALK PHOS: 62 U/L (ref 38–126)
ALT: 18 U/L (ref 14–54)
AST: 20 U/L (ref 15–41)
Albumin: 3.7 g/dL (ref 3.5–5.0)
Anion gap: 10 (ref 5–15)
BUN: 23 mg/dL — AB (ref 6–20)
CALCIUM: 8.3 mg/dL — AB (ref 8.9–10.3)
CO2: 20 mmol/L — AB (ref 22–32)
CREATININE: 2.01 mg/dL — AB (ref 0.44–1.00)
Chloride: 107 mmol/L (ref 101–111)
GFR, EST AFRICAN AMERICAN: 26 mL/min — AB (ref >60)
GFR, EST NON AFRICAN AMERICAN: 23 mL/min — AB (ref >60)
Glucose, Bld: 132 mg/dL — ABNORMAL HIGH (ref 65–99)
Potassium: 3.1 mmol/L — ABNORMAL LOW (ref 3.5–5.1)
Sodium: 137 mmol/L (ref 135–145)
Total Bilirubin: 0.6 mg/dL (ref 0.3–1.2)
Total Protein: 6.6 g/dL (ref 6.5–8.1)

## 2016-06-27 LAB — ETHANOL

## 2016-06-27 LAB — SALICYLATE LEVEL: Salicylate Lvl: <4 mg/dL (ref 2.8–30.0)

## 2016-06-27 LAB — I-STAT CG4 LACTIC ACID, ED: Lactic Acid, Venous: 1.21 mmol/L (ref 0.5–1.9)

## 2016-06-27 LAB — ACETAMINOPHEN LEVEL: ACETAMINOPHEN (TYLENOL), SERUM: 18 ug/mL (ref 10–30)

## 2016-06-27 MED ORDER — POTASSIUM CHLORIDE CRYS ER 20 MEQ PO TBCR
40.0000 meq | EXTENDED_RELEASE_TABLET | Freq: Once | ORAL | Status: AC
  Administered 2016-06-27: 40 meq via ORAL
  Filled 2016-06-27: qty 2

## 2016-06-27 MED ORDER — SODIUM CHLORIDE 0.9 % IV BOLUS (SEPSIS)
1000.0000 mL | Freq: Once | INTRAVENOUS | Status: AC
  Administered 2016-06-27: 1000 mL via INTRAVENOUS

## 2016-06-27 MED ORDER — SODIUM CHLORIDE 0.9 % IV BOLUS (SEPSIS)
500.0000 mL | Freq: Once | INTRAVENOUS | Status: AC
  Administered 2016-06-27: 500 mL via INTRAVENOUS

## 2016-06-27 NOTE — ED Notes (Signed)
Pt refused a in-out and told  Me she would try to void in a little while

## 2016-06-27 NOTE — ED Notes (Signed)
Unable to obtain clean urine sample due to patient having episodes of diarrhea.

## 2016-06-27 NOTE — ED Provider Notes (Signed)
[facility] DEPT Provider Note   CSN: [id number] Arrival date & time: 06/27/16  1520     History   Chief Complaint Chief Complaint  Patient presents with  . Failure To Thrive    HPI Olivia Mullen is a 80 y.o. female.  80yo F w/ PMH including CHF, UC, HTN, RA who p/w decreased appetite and depression. The patient's son and home health nurse decided to call EMS today after the patient seemed to be doing poorly at home related to depression. She reports that she has had a decreased appetite and has to force herself to eat or drink anything. She stopped taking her medications 4 days ago after an episode of vomiting, stating "what's the point?" but then restarted them yesterday. The vomiting resolved. She reports ongoing, chronic, nonbloody diarrhea related to ulcerative colitis as that has not changed recently. She denies any cough/cold symptoms, chest pain, shortness of breath, abdominal pain, urinary symptoms, fevers, or recent illness.  Regarding depression, the patient states she has had ongoing depression since her husband's death. She laments the fact that she cannot do things for herself around the house including getting groceries or her mail and hates having to ask for help. She states that she sometimes feels like she would rather not be here anymore.    The history is provided by the patient.    Past Medical History:  Diagnosis Date  . Anemia   . Anginal pain ([place])    long time ago  . Anxiety   . Cancer ([place])    right breast, 20 years ago  . CHF (congestive heart failure) ([place])   . Chronic systolic heart failure ([place])   . Colitis, collagenous   . Complication of anesthesia "long time ago"   one time woke up during surgery   . Depression   . Headache(784.0)    migraines  . History of kidney stones long time ago  . Hypercholesteremia    "doctor wants to get down to 100, now its 108"  . Hypertension   . Hypothyroidism (acquired) 12/16/2011  . Multinodular goiter  (nontoxic) 12/16/2011  . Normochromic normocytic anemia 12/16/2011  . Pneumonia    hx of, years ago  . Psoriasis   . Rheumatoid arthritis(714.0) 12/16/2011  . UTI (lower urinary tract infection)     Patient Active Problem List   Diagnosis Date Noted  . Acute blood loss anemia 03/11/2016  . Gastric ulcer with hemorrhage 03/11/2016  . Hyperlipidemia 02/19/2015  . Postoperative anemia due to acute blood loss 10/12/2013  . Osteoarthritis of right hip 08/31/2013  . Depression   . Chronic systolic heart failure ([place])   . Hypertension 03/08/2012  . Breast cancer, stage 2 ([place]) 12/16/2011  . Rheumatoid arthritis(714.0) 12/16/2011  . Multinodular goiter (nontoxic) 12/16/2011  . Hypothyroidism (acquired) 12/16/2011  . DM type 2 (diabetes mellitus, type 2) ([place]) 12/16/2011  . Normochromic normocytic anemia 12/16/2011    Past Surgical History:  Procedure Laterality Date  . ABDOMINAL HYSTERECTOMY    . APPENDECTOMY    . BREAST SURGERY  20 years ago   reduced left breast, reconstruction  . CARPAL TUNNEL RELEASE Right   . CATARACT EXTRACTION Bilateral   . CHOLECYSTECTOMY    . ESOPHAGOGASTRODUODENOSCOPY (EGD) WITH PROPOFOL N/A 03/12/2016   Procedure: ESOPHAGOGASTRODUODENOSCOPY (EGD) WITH PROPOFOL;  Surgeon: [name], MD;  Location: [facility] ENDOSCOPY;  Service: Endoscopy;  Laterality: N/A;  . left shoulder     "replaced joint with rod"  . MASTECTOMY  right  . SKIN GRAFT Right    , Calf  . THYROID SURGERY    . TONSILLECTOMY    . TOTAL HIP ARTHROPLASTY Right 10/11/2013   Procedure: RIGHT TOTAL HIP ARTHROPLASTY ANTERIOR APPROACH;  Surgeon: Gearlean Alf, MD;  Location: [facility] ORS;  Service: Orthopedics;  Laterality: Right;    OB History    No data available       Home Medications    Prior to Admission medications   Medication Sig Start Date End Date Taking? Authorizing Provider  acetaminophen (TYLENOL) 500 MG tablet Take 1,000 mg by mouth every 6 (six) hours as needed for  moderate pain.   Yes Historical Provider, MD  butalbital-acetaminophen-caffeine (FIORICET, ESGIC) 50-325-40 MG tablet Take 1 tablet by mouth 2 (two) times daily as needed for headache.   Yes Historical Provider, MD  carvedilol (COREG) 6.25 MG tablet TAKE 1 TABLET (6.25 MG TOTAL) BY MOUTH 2 (TWO) TIMES DAILY WITH A MEAL. 03/08/16  Yes Belva Crome, MD  cholecalciferol (VITAMIN D) 1000 UNITS tablet Take 2,000 Units by mouth daily.    Yes Historical Provider, MD  clonazePAM (KLONOPIN) 0.5 MG tablet Take 0.5 mg by mouth 2 (two) times daily.    Yes Historical Provider, MD  doxepin (SINEQUAN) 50 MG capsule Take 50 mg by mouth at bedtime.   Yes Historical Provider, MD  folic acid (FOLVITE) 1 MG tablet Take 1 mg by mouth daily.  01/17/14  Yes Historical Provider, MD  furosemide (LASIX) 20 MG tablet Take 1 tablet (20 mg total) by mouth daily. 06/07/16  Yes Belva Crome, MD  glycopyrrolate (ROBINUL) 2 MG tablet Take 2 mg by mouth 2 (two) times daily. 02/19/16  Yes Historical Provider, MD  HUMIRA PEN 40 MG/0.8ML injection 1 injection every other week 01/22/14  Yes Historical Provider, MD  KLOR-CON M20 20 MEQ tablet TAKE 1 TABLET (20 MEQ TOTAL) BY MOUTH 2 (TWO) TIMES DAILY. 03/10/15  Yes Belva Crome, MD  leflunomide (ARAVA) 20 MG tablet Take 20 mg by mouth daily.   Yes Historical Provider, MD  levothyroxine (SYNTHROID, LEVOTHROID) 75 MCG tablet Take 75 mcg by mouth daily before breakfast.   Yes Historical Provider, MD  losartan (COZAAR) 25 MG tablet Take 1 tablet (25 mg total) by mouth daily. Please call and schedule an appointment 04/09/16  Yes Belva Crome, MD  Multiple Vitamins-Minerals (ADULT GUMMY PO) Take 2 tablets by mouth daily.   Yes Historical Provider, MD  pantoprazole (PROTONIX) 40 MG tablet Take 1 tablet (40 mg total) by mouth 2 (two) times daily. Patient taking differently: Take 40 mg by mouth daily.  03/16/16  Yes Charlynne Cousins, MD  predniSONE (DELTASONE) 10 MG tablet Take 10-20 mg by mouth 2  (two) times daily. 10 mg in the am and 20 mg at noon   Yes Historical Provider, MD  Probiotic Product (PROBIOTIC DAILY PO) Take 2 tablets by mouth daily.    Yes Historical Provider, MD  sertraline (ZOLOFT) 50 MG tablet Take 50 mg by mouth at bedtime. 06/11/16  Yes Historical Provider, MD  simvastatin (ZOCOR) 10 MG tablet Take 10 mg by mouth daily.   Yes Historical Provider, MD  Amino Acids-Protein Hydrolys (FEEDING SUPPLEMENT, PRO-STAT SUGAR FREE 64,) LIQD Take 30 mLs by mouth 2 (two) times daily. Patient not taking: Reported on 06/27/2016 03/16/16   Charlynne Cousins, MD    Family History Family History  Problem Relation Age of Onset  . Heart disease Mother   .  Healthy Sister     Social History Social History  Substance Use Topics  . Smoking status: Former Smoker    Packs/day: 3.00    Types: Cigarettes    Quit date: 10/21/1992  . Smokeless tobacco: Never Used  . Alcohol use Yes     Comment: occasional     Allergies   Gabapentin; Isosorbide nitrate; Wellbutrin [bupropion]; and Zonisamide   Review of Systems Review of Systems 10 Systems reviewed and are negative for acute change except as noted in the HPI.   Physical Exam Updated Vital Signs BP 136/61 (BP Location: Left Arm)   Pulse 81   Temp 97.9 F (36.6 C) (Oral)   Resp 16   SpO2 98%   Physical Exam  Constitutional: She is oriented to person, place, and time. She appears well-developed and well-nourished. No distress.  HENT:  Head: Normocephalic and atraumatic.  dry mucous membranes  Eyes: Conjunctivae are normal. Pupils are equal, round, and reactive to light.  Neck: Neck supple.  Cardiovascular: Normal rate, regular rhythm and normal heart sounds.   No murmur heard. Pulmonary/Chest: Effort normal and breath sounds normal.  Abdominal: Soft. Bowel sounds are normal. She exhibits no distension. There is tenderness. There is no rebound and no guarding.  Tenderness across lower abdomen at RLQ, LLQ and suprapubic  abd  Musculoskeletal: She exhibits no edema.  Neurological: She is alert and oriented to person, place, and time.  Fluent speech  Skin: Skin is warm and dry. There is pallor.  Psychiatric:  Calm, cooperative, depressed mood, often tearful  Nursing note and vitals reviewed.    ED Treatments / Results  Labs (all labs ordered are listed, but only abnormal results are displayed) Labs Reviewed  COMPREHENSIVE METABOLIC PANEL - Abnormal; Notable for the following:       Result Value   Potassium 3.1 (*)    CO2 20 (*)    Glucose, Bld 132 (*)    BUN 23 (*)    Creatinine, Ser 2.01 (*)    Calcium 8.3 (*)    GFR calc non Af Amer 23 (*)    GFR calc Af Amer 26 (*)    All other components within normal limits  CBC WITH DIFFERENTIAL/PLATELET - Abnormal; Notable for the following:    RBC 3.44 (*)    Hemoglobin 10.5 (*)    HCT 31.9 (*)    RDW 16.7 (*)    All other components within normal limits  ETHANOL  ACETAMINOPHEN LEVEL  SALICYLATE LEVEL  URINALYSIS, ROUTINE W REFLEX MICROSCOPIC (NOT AT [facility])  I-STAT CG4 LACTIC ACID, ED    EKG  EKG Interpretation None       Radiology No results found.  Procedures Procedures (including critical care time)  Medications Ordered in ED Medications  sodium chloride 0.9 % bolus 500 mL (0 mLs Intravenous Stopped 06/27/16 1900)  sodium chloride 0.9 % bolus 1,000 mL (0 mLs Intravenous Stopped 06/27/16 2348)  potassium chloride SA (K-DUR,KLOR-CON) CR tablet 40 mEq (40 mEq Oral Given 06/27/16 2115)     Initial Impression / Assessment and Plan / ED Course  I have reviewed the triage vital signs and the nursing notes.  Pertinent labs that were available during my care of the patient were reviewed by me and considered in my medical decision making (see chart for details).  Clinical Course   Pt brought in due to concerns from son and home health nurse about decreased appetite and recent decision to stop medications. She was awake and  alert on exam, no  acute distress with reassuring vital signs. She was frequently tearful during exam and stated that she sometimes feels like she does not want to live anymore. Because of her anorexia and multiple medical problems, obtained screening lab work and gave fluid bolus.  Labs show normal lactate, creatinine 2, baseline is normal, reassuring blood counts. Potassium 3.1 and I gave her oral potassium repletion. I have ordered a UA but the patient has refused in and out catheter. She has had multiple episodes of nonbloody diarrhea while in the ED. On further questioning, she states that she went off Humira a few months ago due to peptic ulcers and I'm concerned that her ulcerative colitis may be poorly controlled. The ongoing diarrhea with decreased oral intake may be contributing to her AKI. Discussed admission for AKI, diarrhea, deconditioning, and depression w/ Dr. Hal Hope. Pt admitted for further care.  Final Clinical Impressions(s) / ED Diagnoses   Final diagnoses:  None    New Prescriptions New Prescriptions   No medications on file     Sharlett Iles, MD 06/28/16 [id number]

## 2016-06-27 NOTE — Progress Notes (Signed)
ED CM received a call from [facility] home care coordinator, Tim about pt visit to [facility] ED Reviews that pt is grieving her decease husband He died nearly 33 months ago  Pt has [facility] [employer], petroleum and SW active but continues to not eat nor drink, not taking medications not voicing SI nor HI  Has a son in [place] that visits "every other week" and some "paid help" coming in the home only Pt arrived 1520 No labs or imaging results at this time

## 2016-06-27 NOTE — ED Notes (Signed)
Pt. Is unable to urinate at this time. She has asked to eat & drink.

## 2016-06-27 NOTE — ED Triage Notes (Signed)
Per EMS pt from home. Pt is tearful, battling depression from loosing her husband back in February. Pt states she in is no pain just general soreness from being elderly. States she hasn't been eating or drinking much. EMS reports orthostatic change in blood pressure. Pt stopped taking home medication for 4 days because it was upsetting her stomach, believes because she was not eating any food. Pt starting taking medications again yesterday.

## 2016-06-28 ENCOUNTER — Observation Stay (HOSPITAL_COMMUNITY): Payer: Medicare Other

## 2016-06-28 ENCOUNTER — Encounter (HOSPITAL_COMMUNITY): Payer: Self-pay

## 2016-06-28 DIAGNOSIS — Z87891 Personal history of nicotine dependence: Secondary | ICD-10-CM | POA: Diagnosis not present

## 2016-06-28 DIAGNOSIS — E872 Acidosis: Secondary | ICD-10-CM | POA: Diagnosis present

## 2016-06-28 DIAGNOSIS — E039 Hypothyroidism, unspecified: Secondary | ICD-10-CM | POA: Diagnosis present

## 2016-06-28 DIAGNOSIS — Z9011 Acquired absence of right breast and nipple: Secondary | ICD-10-CM | POA: Diagnosis not present

## 2016-06-28 DIAGNOSIS — R197 Diarrhea, unspecified: Secondary | ICD-10-CM

## 2016-06-28 DIAGNOSIS — Z794 Long term (current) use of insulin: Secondary | ICD-10-CM | POA: Diagnosis not present

## 2016-06-28 DIAGNOSIS — M069 Rheumatoid arthritis, unspecified: Secondary | ICD-10-CM | POA: Diagnosis present

## 2016-06-28 DIAGNOSIS — C50919 Malignant neoplasm of unspecified site of unspecified female breast: Secondary | ICD-10-CM | POA: Diagnosis present

## 2016-06-28 DIAGNOSIS — F329 Major depressive disorder, single episode, unspecified: Secondary | ICD-10-CM | POA: Diagnosis present

## 2016-06-28 DIAGNOSIS — I110 Hypertensive heart disease with heart failure: Secondary | ICD-10-CM | POA: Diagnosis present

## 2016-06-28 DIAGNOSIS — R109 Unspecified abdominal pain: Secondary | ICD-10-CM | POA: Diagnosis not present

## 2016-06-28 DIAGNOSIS — E119 Type 2 diabetes mellitus without complications: Secondary | ICD-10-CM | POA: Diagnosis present

## 2016-06-28 DIAGNOSIS — K5190 Ulcerative colitis, unspecified, without complications: Secondary | ICD-10-CM | POA: Diagnosis present

## 2016-06-28 DIAGNOSIS — F419 Anxiety disorder, unspecified: Secondary | ICD-10-CM | POA: Diagnosis present

## 2016-06-28 DIAGNOSIS — L409 Psoriasis, unspecified: Secondary | ICD-10-CM | POA: Diagnosis present

## 2016-06-28 DIAGNOSIS — N179 Acute kidney failure, unspecified: Secondary | ICD-10-CM | POA: Diagnosis present

## 2016-06-28 DIAGNOSIS — I5022 Chronic systolic (congestive) heart failure: Secondary | ICD-10-CM | POA: Diagnosis present

## 2016-06-28 DIAGNOSIS — Z96641 Presence of right artificial hip joint: Secondary | ICD-10-CM | POA: Diagnosis present

## 2016-06-28 DIAGNOSIS — Z888 Allergy status to other drugs, medicaments and biological substances status: Secondary | ICD-10-CM | POA: Diagnosis not present

## 2016-06-28 DIAGNOSIS — E785 Hyperlipidemia, unspecified: Secondary | ICD-10-CM | POA: Diagnosis present

## 2016-06-28 DIAGNOSIS — Z79899 Other long term (current) drug therapy: Secondary | ICD-10-CM | POA: Diagnosis not present

## 2016-06-28 DIAGNOSIS — Z7952 Long term (current) use of systemic steroids: Secondary | ICD-10-CM | POA: Diagnosis not present

## 2016-06-28 DIAGNOSIS — E43 Unspecified severe protein-calorie malnutrition: Secondary | ICD-10-CM | POA: Diagnosis present

## 2016-06-28 LAB — COMPREHENSIVE METABOLIC PANEL
ALT: 16 U/L (ref 14–54)
AST: 17 U/L (ref 15–41)
Albumin: 3.6 g/dL (ref 3.5–5.0)
Alkaline Phosphatase: 60 U/L (ref 38–126)
Anion gap: 10 (ref 5–15)
BILIRUBIN TOTAL: 0.3 mg/dL (ref 0.3–1.2)
BUN: 23 mg/dL — AB (ref 6–20)
CHLORIDE: 113 mmol/L — AB (ref 101–111)
CO2: 19 mmol/L — ABNORMAL LOW (ref 22–32)
CREATININE: 1.68 mg/dL — AB (ref 0.44–1.00)
Calcium: 8.3 mg/dL — ABNORMAL LOW (ref 8.9–10.3)
GFR calc Af Amer: 33 mL/min — ABNORMAL LOW (ref >60)
GFR, EST NON AFRICAN AMERICAN: 28 mL/min — AB (ref >60)
Glucose, Bld: 104 mg/dL — ABNORMAL HIGH (ref 65–99)
Potassium: 3.4 mmol/L — ABNORMAL LOW (ref 3.5–5.1)
Sodium: 142 mmol/L (ref 135–145)
Total Protein: 6.1 g/dL — ABNORMAL LOW (ref 6.5–8.1)

## 2016-06-28 LAB — C DIFFICILE QUICK SCREEN W PCR REFLEX
C Diff antigen: NEGATIVE
C Diff interpretation: NOT DETECTED
C Diff toxin: NEGATIVE

## 2016-06-28 LAB — LACTIC ACID, PLASMA: LACTIC ACID, VENOUS: 2.1 mmol/L — AB (ref 0.5–1.9)

## 2016-06-28 LAB — MRSA PCR SCREENING: MRSA BY PCR: NEGATIVE

## 2016-06-28 MED ORDER — CARVEDILOL 6.25 MG PO TABS
6.2500 mg | ORAL_TABLET | Freq: Two times a day (BID) | ORAL | Status: DC
  Administered 2016-06-28 – 2016-06-30 (×5): 6.25 mg via ORAL
  Filled 2016-06-28 (×5): qty 1

## 2016-06-28 MED ORDER — FOLIC ACID 1 MG PO TABS
1.0000 mg | ORAL_TABLET | Freq: Every day | ORAL | Status: DC
Start: 2016-06-28 — End: 2016-06-30
  Administered 2016-06-28 – 2016-06-30 (×3): 1 mg via ORAL
  Filled 2016-06-28 (×3): qty 1

## 2016-06-28 MED ORDER — POTASSIUM CHLORIDE IN NACL 20-0.9 MEQ/L-% IV SOLN
INTRAVENOUS | Status: AC
  Administered 2016-06-28 – 2016-06-29 (×2): via INTRAVENOUS
  Filled 2016-06-28 (×2): qty 1000

## 2016-06-28 MED ORDER — CLONAZEPAM 0.5 MG PO TABS
0.5000 mg | ORAL_TABLET | Freq: Two times a day (BID) | ORAL | Status: DC
  Administered 2016-06-28 – 2016-06-30 (×5): 0.5 mg via ORAL
  Filled 2016-06-28 (×5): qty 1

## 2016-06-28 MED ORDER — ACETAMINOPHEN 325 MG PO TABS
650.0000 mg | ORAL_TABLET | Freq: Four times a day (QID) | ORAL | Status: DC | PRN
  Administered 2016-06-28 – 2016-06-30 (×3): 650 mg via ORAL
  Filled 2016-06-28 (×3): qty 2

## 2016-06-28 MED ORDER — SERTRALINE HCL 50 MG PO TABS
50.0000 mg | ORAL_TABLET | Freq: Every day | ORAL | Status: DC
  Administered 2016-06-28 – 2016-06-29 (×2): 50 mg via ORAL
  Filled 2016-06-28 (×2): qty 1

## 2016-06-28 MED ORDER — TEMAZEPAM 7.5 MG PO CAPS
7.5000 mg | ORAL_CAPSULE | Freq: Every evening | ORAL | Status: DC | PRN
  Administered 2016-06-28: 7.5 mg via ORAL
  Filled 2016-06-28: qty 1

## 2016-06-28 MED ORDER — PANTOPRAZOLE SODIUM 40 MG PO TBEC
40.0000 mg | DELAYED_RELEASE_TABLET | Freq: Every day | ORAL | Status: DC
Start: 2016-06-28 — End: 2016-06-30
  Administered 2016-06-28 – 2016-06-30 (×3): 40 mg via ORAL
  Filled 2016-06-28 (×3): qty 1

## 2016-06-28 MED ORDER — SODIUM CHLORIDE 0.9 % IV BOLUS (SEPSIS)
1000.0000 mL | Freq: Once | INTRAVENOUS | Status: AC
  Administered 2016-06-28: 1000 mL via INTRAVENOUS

## 2016-06-28 MED ORDER — ONDANSETRON HCL 4 MG PO TABS: 4.0000 mg | ORAL_TABLET | Freq: Four times a day (QID) | ORAL | Status: DC | PRN

## 2016-06-28 MED ORDER — SIMVASTATIN 10 MG PO TABS
10.0000 mg | ORAL_TABLET | Freq: Every day | ORAL | Status: DC
  Administered 2016-06-28 – 2016-06-29 (×2): 10 mg via ORAL
  Filled 2016-06-28 (×2): qty 1

## 2016-06-28 MED ORDER — IOPAMIDOL (ISOVUE-300) INJECTION 61%: 15.0000 mL | Freq: Every day | INTRAVENOUS | Status: DC | PRN

## 2016-06-28 MED ORDER — LEFLUNOMIDE 20 MG PO TABS
20.0000 mg | ORAL_TABLET | Freq: Every day | ORAL | Status: DC
  Administered 2016-06-28 – 2016-06-30 (×3): 20 mg via ORAL
  Filled 2016-06-28 (×3): qty 1

## 2016-06-28 MED ORDER — PREDNISONE 5 MG PO TABS
10.0000 mg | ORAL_TABLET | Freq: Every day | ORAL | Status: DC
  Administered 2016-06-28: 10 mg via ORAL
  Filled 2016-06-28 (×3): qty 2

## 2016-06-28 MED ORDER — HYDRALAZINE HCL 20 MG/ML IJ SOLN: 10.0000 mg | INTRAMUSCULAR | Status: DC | PRN

## 2016-06-28 MED ORDER — PREDNISONE 20 MG PO TABS
20.0000 mg | ORAL_TABLET | Freq: Every day | ORAL | Status: DC
  Administered 2016-06-28: 20 mg via ORAL
  Filled 2016-06-28: qty 1

## 2016-06-28 MED ORDER — DOXEPIN HCL 50 MG PO CAPS
50.0000 mg | ORAL_CAPSULE | Freq: Every day | ORAL | Status: DC
  Administered 2016-06-28 – 2016-06-29 (×2): 50 mg via ORAL
  Filled 2016-06-28 (×2): qty 1

## 2016-06-28 MED ORDER — ACETAMINOPHEN 650 MG RE SUPP: 650.0000 mg | Freq: Four times a day (QID) | RECTAL | Status: DC | PRN

## 2016-06-28 MED ORDER — LEVOTHYROXINE SODIUM 25 MCG PO TABS
75.0000 ug | ORAL_TABLET | Freq: Every day | ORAL | Status: DC
  Administered 2016-06-28 – 2016-06-30 (×3): 75 ug via ORAL
  Filled 2016-06-28 (×3): qty 1

## 2016-06-28 MED ORDER — BUTALBITAL-APAP-CAFFEINE 50-325-40 MG PO TABS: 1.0000 | ORAL_TABLET | Freq: Two times a day (BID) | ORAL | Status: DC | PRN

## 2016-06-28 MED ORDER — GLYCOPYRROLATE 1 MG PO TABS
2.0000 mg | ORAL_TABLET | Freq: Two times a day (BID) | ORAL | Status: DC
  Administered 2016-06-28 – 2016-06-30 (×5): 2 mg via ORAL
  Filled 2016-06-28 (×5): qty 2

## 2016-06-28 MED ORDER — ONDANSETRON HCL 4 MG/2ML IJ SOLN
4.0000 mg | Freq: Four times a day (QID) | INTRAMUSCULAR | Status: DC | PRN
Start: 2016-06-28 — End: 2016-06-30

## 2016-06-28 MED ORDER — ENSURE ENLIVE PO LIQD
237.0000 mL | Freq: Two times a day (BID) | ORAL | Status: DC
  Administered 2016-06-28 – 2016-06-30 (×2): 237 mL via ORAL

## 2016-06-28 MED ORDER — IOPAMIDOL (ISOVUE-300) INJECTION 61%: 15.0000 mL | INTRAVENOUS | Status: AC

## 2016-06-28 NOTE — Progress Notes (Signed)
Initial Nutrition Assessment  DOCUMENTATION CODES:   Severe malnutrition in context of acute illness/injury  INTERVENTION:  - Continue Ensure Enlive po BID, each supplement provides 350 kcal and 20 grams of protein - Encourage PO intakes of meals.  - RD will continue to monitor for additional nutrition-related needs.  NUTRITION DIAGNOSIS:   Inadequate oral intake related to chronic illness as evidenced by per patient/family report, meal completion < 50%.  GOAL:   Patient will meet greater than or equal to 90% of their needs  MONITOR:   PO intake, Weight trends, Labs, Skin  REASON FOR ASSESSMENT:   Malnutrition Screening Tool  ASSESSMENT:   80 y.o. female with collagenous colitis, was admitted in May 2017 for GI bleed with gastric ulcer, history of chronic systolic failure, hypertension, hyperlipidemia, hypothyroidism was brought to the ER after patient was found to be depressed. Patient has lost her husband is February 7 months ago. Since then patient has been having depression. Patient has history of collagenous colitis and has been experiencing more than usual diarrhea for last 1 week. In the ER patient had at least 3 episodes of diarrhea and patient states she had initially some nausea vomiting 1 week ago. Patient 2 weeks ago was placed on antibiotics for right hand wound and since then patient has developed increased diarrhea. Over the last 1 week patient has been having multiple episodes of diarrhea. Feeling weak with poor appetite. Patient also has some lower quadrant pain. Labs revealed acute renal failure with non-anion gap metabolic acidosis. Patient was given fluids and admitted for further management of acute renal failure diarrhea and depression. Patient denies any suicidal ideation.   Pt seen for MST. BMI indicates overweight status. No PO intakes documented since admission. Pt eating [place] toast at time of RD visit this afternoon and states that this is her breakfast as  she was unaware she was able to order a meal earlier in the day. Pt had eaten ~25% of meal prior to RD entering the room and she states plan to order dinner later this evening.   Notes indicate that pt's husband passed away in 11-22-22 and that pt has been depressed since that time, not eating well. Pt also has hx of UC and states that current flare has been ongoing for the past 1 week causing very poor intakes and that the past 3 days she had nothing PO.   During visit with pt, she was very upbeat talking about the upcoming storm, her sons, her granddaughters, and her late husband. Allowed pt to guide conversation rather than obtaining nutrition-related history as notes since admission indicate pt tearful frequently; Chaplain saw pt earlier today.   Physical assessment shows mild to moderate muscle wasting to upper body and moderate to severe fat wasting to upper body. Lower body not assessed at this time. Per chart review, pt has lost 11 lbs (7.4% body weight) in the past 1 month which is significant for time frame. Ensure Enlive already ordered BID.  Pt not meeting needs PTA. She states she sees a GI doctor on an outpatient basis and receives shots that help with UC symptoms (unsure of the name of medication).  Medications reviewed;1 mg oral folic acid/day, 75 mcg Synthroid/day, PRN Zofran, 40 mg Protonix/day, 40 mEq oral KCl x1 dose yesterday, 10 mg + 20 mg oral Deltasone/day. Labs reviewed; K: 3.4 mmol/L, Cl: 113 mmol/L, BUN: 23 mg/dL, creatinine: 1.68 mg/dL, Ca: 8.3 mg/dL, GFR: 28 mL/min. IVF: NS-20 mEq KCl @ 75 mL/hr.  Diet Order:  Diet regular Room service appropriate? Yes; Fluid consistency: Thin  Skin:  Reviewed, no issues  Last BM:  9/8  Height:   Ht Readings from Last 1 Encounters:  06/28/16 5\' 2"  (1.575 m)    Weight:   Wt Readings from Last 1 Encounters:  06/28/16 138 lb 11.2 oz (62.9 kg)    Ideal Body Weight:  50 kg  BMI:  Body mass index is 25.37  kg/m.  Estimated Nutritional Needs:   Kcal:  1400-1600  Protein:  55-65 grams  Fluid:  1.4-1.6 L/day  EDUCATION NEEDS:   No education needs identified at this time    Jarome Matin, MS, RD, LDN Inpatient Clinical Dietitian Pager # [phone] After hours/weekend pager # [phone]

## 2016-06-28 NOTE — Progress Notes (Signed)
Assumed care of patient from [place], [place] at about 1330.  I agree with her shift assessment.  Pt is awake, oriented x4.  Able to make needs known.  Will continue with plan of care.

## 2016-06-28 NOTE — Progress Notes (Signed)
CRITICAL VALUE ALERT  Critical value received: Latic Acid 2.1  Date of notification:  06/28/16   Time of notification:  0626am    Critical value read back :Yes.    Nurse who received alert:  C. Cathlin Buchan, RN  MD notified (1st page) Dr. Hal Hope    Time of first page: 0626   MD notified (2nd page):  Time of second page:  Responding MD:    Time MD responded:
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
## 2016-06-28 NOTE — Progress Notes (Signed)
Report from night RN stated that patient was off and on crying throughout the night and reported she had been depressed since her husband passed away in 12-10-2022. Patient told RN that she had been "married for 60 years and that God took the wrong one first."  Patient will be speaking with RN and all of a sudden burst out in tears.  RN asked patient if she would like to speak with a chaplain and patient agreed. Spiritual care consult initiated and chaplain paged.  Will continue to monitor.

## 2016-06-28 NOTE — H&P (Signed)
History and Physical    Olivia Mullen [id number] DOB: 08/09/1936 DOA: 06/27/2016  PCP: [name], MD  Patient coming from: Home.  Chief Complaint: Depression and diarrhea.  HPI: Olivia Mullen is a 80 y.o. female with collagenous colitis, was admitted in May 2017 for GI bleed with gastric ulcer, history of chronic systolic failure, hypertension, hyperlipidemia, hypothyroidism was brought to the ER after patient was found to be depressed. Patient has lost her husband is February 7 months ago. Since then patient has been having depression. Patient has history of collagenous colitis and has been experiencing more than usual diarrhea for last 1 week. In the ER patient had at least 3 episodes of diarrhea and patient states she had initially some nausea vomiting 1 week ago. Patient 2 weeks ago was placed on antibiotics for right hand wound and since then patient has developed increased diarrhea. Over the last 1 week patient has been having multiple episodes of diarrhea. Feeling weak with poor appetite. Patient also has some lower quadrant pain. Labs revealed acute renal failure with non-anion gap metabolic acidosis. Patient was given fluids and admitted for further management of acute renal failure diarrhea and depression. Patient denies any suicidal ideation.   ED Course: See history of presenting illness.  Review of Systems: As per HPI, rest all negative.   Past Medical History:  Diagnosis Date  . Anemia   . Anginal pain ([place])    long time ago  . Anxiety   . Cancer ([place])    right breast, 20 years ago  . CHF (congestive heart failure) ([place])   . Chronic systolic heart failure ([place])   . Colitis, collagenous   . Complication of anesthesia "long time ago"   one time woke up during surgery   . Depression   . Headache(784.0)    migraines  . History of kidney stones long time ago  . Hypercholesteremia    "doctor wants to get down to 100, now its 108"  . Hypertension   .  Hypothyroidism (acquired) 12/16/2011  . Multinodular goiter (nontoxic) 12/16/2011  . Normochromic normocytic anemia 12/16/2011  . Pneumonia    hx of, years ago  . Psoriasis   . Rheumatoid arthritis(714.0) 12/16/2011  . UTI (lower urinary tract infection)     Past Surgical History:  Procedure Laterality Date  . ABDOMINAL HYSTERECTOMY    . APPENDECTOMY    . BREAST SURGERY  20 years ago   reduced left breast, reconstruction  . CARPAL TUNNEL RELEASE Right   . CATARACT EXTRACTION Bilateral   . CHOLECYSTECTOMY    . ESOPHAGOGASTRODUODENOSCOPY (EGD) WITH PROPOFOL N/A 03/12/2016   Procedure: ESOPHAGOGASTRODUODENOSCOPY (EGD) WITH PROPOFOL;  Surgeon: [name], MD;  [facility];  Service: Endoscopy;  Laterality: N/A;  . left shoulder     "replaced joint with rod"  . MASTECTOMY     right  . SKIN GRAFT Right    , Calf  . THYROID SURGERY    . TONSILLECTOMY    . TOTAL HIP ARTHROPLASTY Right 10/11/2013   Procedure: RIGHT TOTAL HIP ARTHROPLASTY ANTERIOR APPROACH;  Surgeon: Gearlean Alf, MD;  Location: [facility];  Service: Orthopedics;  Laterality: Right;     reports that she quit smoking about 23 years ago. Her smoking use included Cigarettes. She smoked 3.00 packs per day. She has never used smokeless tobacco. She reports that she drinks alcohol. She reports that she does not use drugs.  Allergies  Allergen Reactions  . Gabapentin Itching  .  Isosorbide Nitrate Itching  . Wellbutrin [Bupropion] Other (See Comments)    mental changes and increase anger  . Zonisamide Itching    Family History  Problem Relation Age of Onset  . Heart disease Mother   . Healthy Sister     Prior to Admission medications   Medication Sig Start Date End Date Taking? Authorizing Provider  acetaminophen (TYLENOL) 500 MG tablet Take 1,000 mg by mouth every 6 (six) hours as needed for moderate pain.   Yes Historical Provider, MD  butalbital-acetaminophen-caffeine (FIORICET, ESGIC) 50-325-40 MG  tablet Take 1 tablet by mouth 2 (two) times daily as needed for headache.   Yes Historical Provider, MD  carvedilol (COREG) 6.25 MG tablet TAKE 1 TABLET (6.25 MG TOTAL) BY MOUTH 2 (TWO) TIMES DAILY WITH A MEAL. 03/08/16  Yes Belva Crome, MD  cholecalciferol (VITAMIN D) 1000 UNITS tablet Take 2,000 Units by mouth daily.    Yes Historical Provider, MD  clonazePAM (KLONOPIN) 0.5 MG tablet Take 0.5 mg by mouth 2 (two) times daily.    Yes Historical Provider, MD  doxepin (SINEQUAN) 50 MG capsule Take 50 mg by mouth at bedtime.   Yes Historical Provider, MD  folic acid (FOLVITE) 1 MG tablet Take 1 mg by mouth daily.  01/17/14  Yes Historical Provider, MD  furosemide (LASIX) 20 MG tablet Take 1 tablet (20 mg total) by mouth daily. 06/07/16  Yes Belva Crome, MD  glycopyrrolate (ROBINUL) 2 MG tablet Take 2 mg by mouth 2 (two) times daily. 02/19/16  Yes Historical Provider, MD  HUMIRA PEN 40 MG/0.8ML injection 1 injection every other week 01/22/14  Yes Historical Provider, MD  KLOR-CON M20 20 MEQ tablet TAKE 1 TABLET (20 MEQ TOTAL) BY MOUTH 2 (TWO) TIMES DAILY. 03/10/15  Yes Belva Crome, MD  leflunomide (ARAVA) 20 MG tablet Take 20 mg by mouth daily.   Yes Historical Provider, MD  levothyroxine (SYNTHROID, LEVOTHROID) 75 MCG tablet Take 75 mcg by mouth daily before breakfast.   Yes Historical Provider, MD  losartan (COZAAR) 25 MG tablet Take 1 tablet (25 mg total) by mouth daily. Please call and schedule an appointment 04/09/16  Yes Belva Crome, MD  Multiple Vitamins-Minerals (ADULT GUMMY PO) Take 2 tablets by mouth daily.   Yes Historical Provider, MD  pantoprazole (PROTONIX) 40 MG tablet Take 1 tablet (40 mg total) by mouth 2 (two) times daily. Patient taking differently: Take 40 mg by mouth daily.  03/16/16  Yes Charlynne Cousins, MD  predniSONE (DELTASONE) 10 MG tablet Take 10-20 mg by mouth 2 (two) times daily. 10 mg in the am and 20 mg at noon   Yes Historical Provider, MD  Probiotic Product (PROBIOTIC  DAILY PO) Take 2 tablets by mouth daily.    Yes Historical Provider, MD  sertraline (ZOLOFT) 50 MG tablet Take 50 mg by mouth at bedtime. 06/11/16  Yes Historical Provider, MD  simvastatin (ZOCOR) 10 MG tablet Take 10 mg by mouth daily.   Yes Historical Provider, MD  Amino Acids-Protein Hydrolys (FEEDING SUPPLEMENT, PRO-STAT SUGAR FREE 64,) LIQD Take 30 mLs by mouth 2 (two) times daily. Patient not taking: Reported on 06/27/2016 03/16/16   Charlynne Cousins, MD    Physical Exam: Vitals:   06/27/16 1930 06/27/16 2210 06/28/16 0104 06/28/16 0258  BP: 149/72 143/67 136/61 (!) 141/63  Pulse: 86 78 81 78  Resp: 20 18 16 16   Temp: 97.9 F (36.6 C)   98.7 F (37.1 C)  TempSrc: Oral  Oral  SpO2: 97% 96% 98% 100%  Weight:    138 lb 11.2 oz (62.9 kg)  Height:    5\' 2"  (1.575 m)      Constitutional: Not in distress. Vitals:   06/27/16 1930 06/27/16 2210 06/28/16 0104 06/28/16 0258  BP: 149/72 143/67 136/61 (!) 141/63  Pulse: 86 78 81 78  Resp: 20 18 16 16   Temp: 97.9 F (36.6 C)   98.7 F (37.1 C)  TempSrc: Oral   Oral  SpO2: 97% 96% 98% 100%  Weight:    138 lb 11.2 oz (62.9 kg)  Height:    5\' 2"  (1.575 m)   Eyes: Anicteric no pallor. ENMT: No discharge from the ears eyes nose or mouth. Neck: No mass felt. No neck rigidity. Respiratory: No rhonchi or crepitations. Cardiovascular: S1 and S2 heard. Abdomen: Mild tenderness in the left lower quadrant. Musculoskeletal: No edema. Skin: No rash. Neurologic: Alert awake oriented to time place and person. Moves all extremities. Psychiatric: Appears normal.   Labs on Admission: I have personally reviewed following labs and imaging studies  CBC:  Recent Labs Lab 06/27/16 1802  WBC 8.0  NEUTROABS 6.5  HGB 10.5*  HCT 31.9*  MCV 92.7  PLT 123456   Basic Metabolic Panel:  Recent Labs Lab 06/27/16 1802  NA 137  K 3.1*  CL 107  CO2 20*  GLUCOSE 132*  BUN 23*  CREATININE 2.01*  CALCIUM 8.3*   GFR: Estimated Creatinine  Clearance: 20.1 mL/min (by C-G formula based on SCr of 2.01 mg/dL). Liver Function Tests:  Recent Labs Lab 06/27/16 1802  AST 20  ALT 18  ALKPHOS 62  BILITOT 0.6  PROT 6.6  ALBUMIN 3.7   No results for input(s): LIPASE, AMYLASE in the last 168 hours. No results for input(s): AMMONIA in the last 168 hours. Coagulation Profile: No results for input(s): INR, PROTIME in the last 168 hours. Cardiac Enzymes: No results for input(s): CKTOTAL, CKMB, CKMBINDEX, TROPONINI in the last 168 hours. BNP (last 3 results) No results for input(s): PROBNP in the last 8760 hours. HbA1C: No results for input(s): HGBA1C in the last 72 hours. CBG: No results for input(s): GLUCAP in the last 168 hours. Lipid Profile: No results for input(s): CHOL, HDL, LDLCALC, TRIG, CHOLHDL, LDLDIRECT in the last 72 hours. Thyroid Function Tests: No results for input(s): TSH, T4TOTAL, FREET4, T3FREE, THYROIDAB in the last 72 hours. Anemia Panel: No results for input(s): VITAMINB12, FOLATE, FERRITIN, TIBC, IRON, RETICCTPCT in the last 72 hours. Urine analysis:    Component Value Date/Time   COLORURINE AMBER (A) 03/13/2016 1027   APPEARANCEUR TURBID (A) 03/13/2016 1027   LABSPEC 1.018 03/13/2016 1027   PHURINE 5.5 03/13/2016 1027   GLUCOSEU NEGATIVE 03/13/2016 1027   HGBUR MODERATE (A) 03/13/2016 1027   BILIRUBINUR NEGATIVE 03/13/2016 1027   KETONESUR NEGATIVE 03/13/2016 1027   PROTEINUR NEGATIVE 03/13/2016 1027   UROBILINOGEN 0.2 10/05/2013 1209   NITRITE NEGATIVE 03/13/2016 1027   LEUKOCYTESUR LARGE (A) 03/13/2016 1027   Sepsis Labs: @LABRCNTIP (procalcitonin:4,lacticidven:4) )No results found for this or any previous visit (from the past 240 hour(s)).   Radiological Exams on Admission: No results found.   Assessment/Plan Principal Problem:   AKI (acute kidney injury) ([place]) Active Problems:   Breast cancer, stage 2 (HCC)   Hypothyroidism (acquired)   DM type 2 (diabetes mellitus, type 2) (HCC)    Chronic systolic heart failure (HCC)   Diarrhea    1. Acute renal failure with non-anion gap metabolic acidosis  probably from poor oral intake and severe diarrhea - continue with hydration and hold Lasix and Cozaar for now. Closely follow intake output and metabolic panel. 2. Diarrhea with history of collagenous colitis - patient states she did missed 3 days of her prednisone when she had nausea vomiting last week. Since patient also was recently on antibiotics we'll check stool studies for C. difficile and also GI pathogens. Continue prednisone. Patient does complain of some lower quadrant pain and if lactate is elevated we'll get CT abdomen and pelvis with oral contrast. 3. Depression/grief - may discuss with psychiatrist in a.m. No suicidal ideation. 4. History of chronic systolic heart failure EF measured in 2013 was 20-25% and in 2016 was 50-55%. Holding off Lasix and Cozaar due to acute renal failure. Continue Coreg. 5. Hypothyroidism on Synthroid. 6. Chronic anemia with history of GI bleed in May 2017 secondary to gastric ulcer - on PPI. Follow CBC. 7. Hyperlipidemia on statins.   DVT prophylaxis: SCDs. Code Status: Full code.  Family Communication: Discussed with patient.  Disposition Plan: Home.  Consults called: None.  Admission status: Observation.    Rise Patience MD [facility] Pager [phone].  If 7PM-7AM, please contact night-coverage [email or web address] Password [facility]  06/28/2016, 4:49 AM

## 2016-06-28 NOTE — Progress Notes (Signed)
   06/28/16 1100  Clinical Encounter Type  Visited With Patient  Visit Type Psychological support;Spiritual support  Referral From Patient  Stress Factors  Patient Stress Factors Major life changes  Family Stress Factors Loss  Chaplain and counseling intern visited with patient in response to consult. Upon arrival, patient appeared tearful. She described her intense grief surrounding the death of her husband earlier this year, to whom she was married for 60 years. She expressed that never in her life has she felt so lonely and so scared and that she shouldn't have to go through this at her age. She expressed anger at God for "taking the wrong person" and described many of her husband's positive personality traits and behaviors. She described a very close relationship with her husband, noting especially his support of her during her battle with cancer. Because of her dependence on her husband for support, she appears to feelhelpless and is intensely afraid of dealing with her current health crisis on her own. Her stories about their relationship indicate that she relied heavily on him for both emotional support and to handle their business affairs. She stated that it was hard living alone in the house that they shared for 45 years and that every space in the house reminds her of him and the things they did together. She stated that she not been able to take care of things around the house such as the plants and their "adopted" feral cat. She stated that she was dreading the upcoming holiday season and described how special a time it was for her husband and her family. Chaplain and intern affirmed that the holidays would, indeed, be very different and offered to help her figure out a plan for how she would get through it.  Patient asked chaplain and intern several times to tell her "what to do now"; we affirmed that she is in the process of doing what she needs to do in order to mover through her  grief.  Chaplain and intern affirmed patient's grief and encouraged her to express all that she was feeling in the moment. Patient had several moments of tearfulness and intense emotion throughout the visit. Patient asked chaplain and intern to "please come back." Chaplain and intern will follow up with patient, continuing to affirm her in the grief process and to help her plan for finding support resources after discharge.  Lamount Cohen    Counseling Intern

## 2016-06-28 NOTE — Care Management Obs Status (Signed)
[place] NOTIFICATION   Patient Details  Name: [name] MRN: [id number] Date of Birth: [birth year]   Medicare Observation Status Notification Given:  Yes    Geran Haithcock, Antony Haste, RN 06/28/2016, 11:11 AM

## 2016-06-28 NOTE — Progress Notes (Signed)
PROGRESS NOTE  Olivia Mullen [id number] DOB: 06/21/1936 DOA: 06/27/2016 PCP: [name], MD  HPI/Recap of past 24 hours: Olivia Mullen is a 80 y.o. female with collagenous colitis, was admitted in May 2017 for GI bleed with gastric ulcer, history of chronic systolic failure, hypertension, hyperlipidemia, hypothyroidism was brought to the ER after patient was found to be depressed. Patient has lost her husband is February 7 months ago. Since then patient has been having depression. Patient has history of collagenous colitis and has been experiencing more than usual diarrhea for last 1 week. In the ER patient had at least 3 episodes of diarrhea and patient states she had initially some nausea vomiting 1 week ago. Patient 2 weeks ago was placed on antibiotics for right hand wound and since then patient has developed increased diarrhea. Over the last 1 week patient has been having multiple episodes of diarrhea. Feeling weak with poor appetite. Patient also has some lower quadrant pain. Labs revealed acute renal failure with non-anion gap metabolic acidosis. Patient was given fluids and admitted for further management of acute renal failure diarrhea and depression. Patient denies any suicidal ideation.    Assessment/Plan: Principal Problem:   AKI (acute kidney injury) ([facility]) Active Problems:   Breast cancer, stage 2 ([facility])   Hypothyroidism (acquired)   DM type 2 (diabetes mellitus, type 2) (HCC)   Chronic systolic heart failure (HCC)   Diarrhea  1. Acute renal failure with non-anion gap metabolic acidosis probably from poor oral intake and severe diarrhea - continue with hydration and hold Lasix and Cozaar for now. Closely follow intake output and metabolic panel. Note creatinine already quite improved in less than 24 hours. 2. Diarrhea with history of collagenous colitis - patient states she did missed 3 days of her prednisone when she had nausea vomiting last week. Since patient also  was recently on antibiotics we'll check stool studies for C. difficile and also GI pathogens. Continue prednisone. Patient does complain of some lower quadrant pain so CT scan done without acute findings. Patient says her abdominal cramping and diarrhea are no different from her usual baseline. 3. Depression/grief - feels better after chaplain visit this a.m. No suicidal ideation. 4. History of chronic systolic heart failure EF measured in 2013 was 20-25% and in 2016 was 50-55%. Holding off Lasix and Cozaar due to acute renal failure. Continue Coreg. 5. Hypothyroidism on Synthroid. 6. Chronic anemia with history of GI bleed in May 2017 secondary to gastric ulcer - on PPI. Follow CBC. 7. Hyperlipidemia on statins.  Code Status: FULL   Family Communication: None present   Disposition Plan: Likely to home pending recovery.  Consultants:  Chaplain   Procedures:  None   Antimicrobials:  None   Objective: Vitals:   06/27/16 1930 06/27/16 2210 06/28/16 0104 06/28/16 0258  BP: 149/72 143/67 136/61 (!) 141/63  Pulse: 86 78 81 78  Resp: 20 18 16 16   Temp: 97.9 F (36.6 C)   98.7 F (37.1 C)  TempSrc: Oral   Oral  SpO2: 97% 96% 98% 100%  Weight:    62.9 kg (138 lb 11.2 oz)  Height:    5\' 2"  (1.575 m)    Intake/Output Summary (Last 24 hours) at 06/28/16 1441 Last data filed at 06/28/16 1100  Gross per 24 hour  Intake                0 ml  Output  6 ml  Net               -6 ml   Filed Weights   06/28/16 0258  Weight: 62.9 kg (138 lb 11.2 oz)    Exam: General:  Alert, oriented, calm, in no acute distress Eyes: pupils round and reactive to light and accomodation, clear sclerea Neck: supple, no masses, trachea mildline  Cardiovascular: RRR, no murmurs or rubs, no peripheral edema  Respiratory: clear to auscultation bilaterally, no wheezes, no crackles  Abdomen: soft, nontender, nondistended, normal bowel tones heard  Skin: dry, no rashes  Musculoskeletal: no  joint effusions, normal range of motion  Psychiatric: appropriate affect, normal speech  Neurologic: extraocular muscles intact, clear speech, moving all extremities with intact sensorium    Data Reviewed: CBC:  Recent Labs Lab 06/27/16 1802  WBC 8.0  NEUTROABS 6.5  HGB 10.5*  HCT 31.9*  MCV 92.7  PLT 123456   Basic Metabolic Panel:  Recent Labs Lab 06/27/16 1802 06/28/16 0534  NA 137 142  K 3.1* 3.4*  CL 107 113*  CO2 20* 19*  GLUCOSE 132* 104*  BUN 23* 23*  CREATININE 2.01* 1.68*  CALCIUM 8.3* 8.3*   GFR: Estimated Creatinine Clearance: 24 mL/min (by C-G formula based on SCr of 1.68 mg/dL). Liver Function Tests:  Recent Labs Lab 06/27/16 1802 06/28/16 0534  AST 20 17  ALT 18 16  ALKPHOS 62 60  BILITOT 0.6 0.3  PROT 6.6 6.1*  ALBUMIN 3.7 3.6   No results for input(s): LIPASE, AMYLASE in the last 168 hours. No results for input(s): AMMONIA in the last 168 hours. Coagulation Profile: No results for input(s): INR, PROTIME in the last 168 hours. Cardiac Enzymes: No results for input(s): CKTOTAL, CKMB, CKMBINDEX, TROPONINI in the last 168 hours. BNP (last 3 results) No results for input(s): PROBNP in the last 8760 hours. HbA1C: No results for input(s): HGBA1C in the last 72 hours. CBG: No results for input(s): GLUCAP in the last 168 hours. Lipid Profile: No results for input(s): CHOL, HDL, LDLCALC, TRIG, CHOLHDL, LDLDIRECT in the last 72 hours. Thyroid Function Tests: No results for input(s): TSH, T4TOTAL, FREET4, T3FREE, THYROIDAB in the last 72 hours. Anemia Panel: No results for input(s): VITAMINB12, FOLATE, FERRITIN, TIBC, IRON, RETICCTPCT in the last 72 hours. Urine analysis:    Component Value Date/Time   COLORURINE AMBER (A) 03/13/2016 1027   APPEARANCEUR TURBID (A) 03/13/2016 1027   LABSPEC 1.018 03/13/2016 1027   PHURINE 5.5 03/13/2016 1027   GLUCOSEU NEGATIVE 03/13/2016 1027   HGBUR MODERATE (A) 03/13/2016 1027   BILIRUBINUR NEGATIVE  03/13/2016 1027   KETONESUR NEGATIVE 03/13/2016 1027   PROTEINUR NEGATIVE 03/13/2016 1027   UROBILINOGEN 0.2 10/05/2013 1209   NITRITE NEGATIVE 03/13/2016 1027   LEUKOCYTESUR LARGE (A) 03/13/2016 1027   Sepsis Labs: @LABRCNTIP (procalcitonin:4,lacticidven:4)  ) Recent Results (from the past 240 hour(s))  MRSA PCR Screening     Status: None   Collection Time: 06/28/16  3:51 AM  Result Value Ref Range Status   MRSA by PCR NEGATIVE NEGATIVE Final    Comment:        The GeneXpert MRSA Assay (FDA approved for NASAL specimens only), is one component of a comprehensive MRSA colonization surveillance program. It is not intended to diagnose MRSA infection nor to guide or monitor treatment for MRSA infections.       Studies: Ct Abdomen Pelvis Wo Contrast  Result Date: 06/28/2016 CLINICAL DATA:  Diffuse abdominal pain EXAM: CT ABDOMEN AND PELVIS  WITHOUT CONTRAST TECHNIQUE: Multidetector CT imaging of the abdomen and pelvis was performed following the standard protocol without IV contrast. COMPARISON:  None. FINDINGS: Lower chest: Lung bases are well aerated without focal infiltrate. Mitral valve calcifications are seen. Hepatobiliary: Gallbladder appears of been surgically removed. The liver is within normal limits. Pancreas: Within normal limits. Spleen: Multiple calcified granulomas. No other focal abnormality is seen. Adrenals/Urinary Tract: The adrenal glands are unremarkable. A tiny nonobstructing right lower pole renal stone is seen. The collecting systems and ureters are unremarkable. The bladder is decompressed. Stomach/Bowel: The appendix has been surgically removed. Scattered large and small bowel gas is noted without obstructive change. Diffuse laxity of the anterior abdominal wall is noted although no true hernia is seen. Vascular/Lymphatic: Diffuse aortoiliac calcifications are noted without aneurysmal dilatation. No significant lymphadenopathy is noted. Reproductive: Uterus has been  surgically removed. Other: No significant free pelvic fluid or pelvic mass lesion is noted. Musculoskeletal: The osseous structures show degenerative change of the lumbar spine as well as changes of prior hip replacement on the right. No compression deformities are seen. IMPRESSION: Chronic changes as described above.  No acute abnormality is noted. Electronically Signed   By: Inez Catalina M.D.   On: 06/28/2016 12:00    Scheduled Meds: . carvedilol  6.25 mg Oral BID WC  . clonazePAM  0.5 mg Oral BID  . doxepin  50 mg Oral QHS  . feeding supplement (ENSURE ENLIVE)  237 mL Oral BID BM  . folic acid  1 mg Oral Daily  . glycopyrrolate  2 mg Oral BID  . leflunomide  20 mg Oral Daily  . levothyroxine  75 mcg Oral QAC breakfast  . pantoprazole  40 mg Oral Daily  . predniSONE  10 mg Oral QAC breakfast  . predniSONE  20 mg Oral Q1200  . sertraline  50 mg Oral QHS  . simvastatin  10 mg Oral Daily    Continuous Infusions: . 0.9 % NaCl with KCl 20 mEq / L 75 mL/hr at 06/28/16 0517     LOS: 0 days   Time spent: 22 minutes  Olivia Walling Marry Guan, MD [facility] Pager [phone]  If 7PM-7AM, please contact night-coverage [email or web address] Password [email or web address] 06/28/2016, 2:41 PM

## 2016-06-28 NOTE — Progress Notes (Signed)
   06/28/16 1700  Clinical Encounter Type  Visited With Patient  Visit Type Follow-up;Psychological support  Counseling intern visited with patient again this afternoon to follow up. Patient appeared alert and in a brighter mood than during the previous visit. Patient stated that she had had a good meal but had not slept at all during the day. She was also waiting to hear from her son. Although patient continued to express grief over the loss of her husband, she cried less frequently and smiled and laughed more as she talked about him and their life together. During this visit, patient talked more about herself, describing her hobbies and ways she has found to take of herself now that she is living at home alone. She showed intern photos of her family members and described her relationships with them.   Intern continued to affirm patient's emotions and her process for working through her grief. Intern left as patient received phone call from her son.  Lamount Cohen, Counseling Intern Department of Spiritual Care and [facility] Supervisor - [address] [place], [place]

## 2016-06-29 DIAGNOSIS — N179 Acute kidney failure, unspecified: Principal | ICD-10-CM

## 2016-06-29 DIAGNOSIS — E43 Unspecified severe protein-calorie malnutrition: Secondary | ICD-10-CM | POA: Insufficient documentation

## 2016-06-29 LAB — BASIC METABOLIC PANEL WITH GFR
Anion gap: 7 (ref 5–15)
BUN: 19 mg/dL (ref 6–20)
CO2: 17 mmol/L — ABNORMAL LOW (ref 22–32)
Calcium: 8 mg/dL — ABNORMAL LOW (ref 8.9–10.3)
Chloride: 123 mmol/L — ABNORMAL HIGH (ref 101–111)
Creatinine, Ser: 1.2 mg/dL — ABNORMAL HIGH (ref 0.44–1.00)
GFR calc Af Amer: 49 mL/min — ABNORMAL LOW (ref >60)
GFR calc non Af Amer: 42 mL/min — ABNORMAL LOW (ref >60)
Glucose, Bld: 137 mg/dL — ABNORMAL HIGH (ref 65–99)
Potassium: 3.8 mmol/L (ref 3.5–5.1)
Sodium: 147 mmol/L — ABNORMAL HIGH (ref 135–145)

## 2016-06-29 LAB — GASTROINTESTINAL PANEL BY PCR, STOOL (REPLACES STOOL CULTURE)
ASTROVIRUS: NOT DETECTED
Adenovirus F40/41: NOT DETECTED
CYCLOSPORA CAYETANENSIS: NOT DETECTED
Campylobacter species: NOT DETECTED
Cryptosporidium: NOT DETECTED
E. COLI O157: NOT DETECTED
ENTEROTOXIGENIC E COLI (ETEC): NOT DETECTED
Entamoeba histolytica: NOT DETECTED
Enteroaggregative E coli (EAEC): NOT DETECTED
Enteropathogenic E coli (EPEC): NOT DETECTED
Giardia lamblia: NOT DETECTED
NOROVIRUS GI/GII: NOT DETECTED
Plesimonas shigelloides: NOT DETECTED
ROTAVIRUS A: NOT DETECTED
SAPOVIRUS (I, II, IV, AND V): NOT DETECTED
SHIGA LIKE TOXIN PRODUCING E COLI (STEC): NOT DETECTED
SHIGELLA/ENTEROINVASIVE E COLI (EIEC): NOT DETECTED
Salmonella species: NOT DETECTED
VIBRIO SPECIES: NOT DETECTED
Vibrio cholerae: NOT DETECTED
Yersinia enterocolitica: NOT DETECTED

## 2016-06-29 LAB — CBC
HCT: 29.9 % — ABNORMAL LOW (ref 36.0–46.0)
Hemoglobin: 9.5 g/dL — ABNORMAL LOW (ref 12.0–15.0)
MCH: 30 pg (ref 26.0–34.0)
MCHC: 31.8 g/dL (ref 30.0–36.0)
MCV: 94.3 fL (ref 78.0–100.0)
Platelets: 192 K/uL (ref 150–400)
RBC: 3.17 MIL/uL — ABNORMAL LOW (ref 3.87–5.11)
RDW: 17.1 % — ABNORMAL HIGH (ref 11.5–15.5)
WBC: 7.5 K/uL (ref 4.0–10.5)

## 2016-06-29 MED ORDER — LOPERAMIDE HCL 2 MG PO CAPS
2.0000 mg | ORAL_CAPSULE | ORAL | Status: DC | PRN
  Administered 2016-06-29 – 2016-06-30 (×4): 2 mg via ORAL
  Filled 2016-06-29 (×4): qty 1

## 2016-06-29 MED ORDER — BUDESONIDE 3 MG PO CPEP
9.0000 mg | ORAL_CAPSULE | Freq: Every day | ORAL | Status: DC
  Administered 2016-06-29 – 2016-06-30 (×2): 9 mg via ORAL
  Filled 2016-06-29 (×2): qty 3

## 2016-06-29 NOTE — Progress Notes (Signed)
PROGRESS NOTE  Olivia Mullen [id number] DOB: [birth year] DOA: 06/27/2016 PCP: [name], MD  HPI/Recap of past 24 hours: Olivia Mullen is a 80 y.o. female with collagenous colitis, was admitted in May 2017 for GI bleed with gastric ulcer, history of chronic systolic failure, hypertension, hyperlipidemia, hypothyroidism was brought to the ER after patient was found to be depressed. Patient has lost her husband is February 7 months ago. Since then patient has been having depression. Patient has history of collagenous colitis and has been experiencing more than usual diarrhea for last 1 week. In the ER patient had at least 3 episodes of diarrhea and patient states she had initially some nausea vomiting 1 week ago. Patient 2 weeks ago was placed on antibiotics for right hand wound and since then patient has developed increased diarrhea. Labs revealed AKI which is improving with IVF. She says her diarrhea is now as bad as it chronically is, though nursing says she is unable to get to the bathroom on time.     Assessment/Plan: Principal Problem:   AKI (acute kidney injury) ([facility]) Active Problems:   Breast cancer, stage 2 ([facility])   Hypothyroidism (acquired)   DM type 2 (diabetes mellitus, type 2) ([facility])   Chronic systolic heart failure ([facility])   Diarrhea   Protein-calorie malnutrition, severe  1. Acute renal failure with non-anion gap metabolic acidosis probably from poor oral intake and severe diarrhea - continue with hydration and hold Lasix and Cozaar for now. Closely follow intake output and metabolic panel. Creatinine improving. 2. Diarrhea with history of collagenous colitis - patient states she did missed 3 days of her prednisone when she had nausea vomiting last week. Since patient also was recently on antibiotics we checked stool studies for C. difficile and also GI pathogens. These were negative. Patient does complain of some lower quadrant pain so CT scan done without acute  findings. Patient says her abdominal cramping and diarrhea are no different from her usual baseline. Discussed today with GI on call for [facility], recommend Budesonide 9mg  PO daily and we have started this. Since no infection I've also started Immodium PRN.  3. Depression/grief - feels better after chaplain visit 9/8. No suicidal ideation. 4. History of chronic systolic heart failure EF measured in 2013 was 20-25% and in 2016 was 50-55%. Holding off Lasix and Cozaar due to acute renal failure. Continue Coreg. 5. Hypothyroidism on Synthroid. 6. Chronic anemia with history of GI bleed in May 2017 secondary to gastric ulcer - on PPI. Follow CBC. 7. Hyperlipidemia on statins.  Code Status: FULL   Family Communication: None present   Disposition Plan: Likely to home pending recovery in next 24 hours.  Consultants:  Chaplain   Procedures:  None   Antimicrobials:  None   Objective: Vitals:   06/28/16 0258 06/28/16 1330 06/28/16 2157 06/29/16 0516  BP: (!) 141/63 129/72 (!) 141/68 (!) 156/70  Pulse: 78 75 85 87  Resp: 16 18 16 16   Temp: 98.7 F (37.1 C) 98.6 F (37 C) 98 F (36.7 C) 98 F (36.7 C)  TempSrc: Oral Oral Oral Oral  SpO2: 100% 100% 96% 96%  Weight: 62.9 kg (138 lb 11.2 oz)   66.8 kg (147 lb 4.3 oz)  Height: 5\' 2"  (1.575 m)       Intake/Output Summary (Last 24 hours) at 06/29/16 1018 Last data filed at 06/29/16 0300  Gross per 24 hour  Intake          1628.75 ml  Output                2 ml  Net          1626.75 ml   Filed Weights   06/28/16 0258 06/29/16 0516  Weight: 62.9 kg (138 lb 11.2 oz) 66.8 kg (147 lb 4.3 oz)    Exam: General:  Alert, oriented, calm, in no acute distress Eyes: pupils round and reactive to light and accomodation, clear sclerea Neck: supple, no masses, trachea mildline  Cardiovascular: RRR, no murmurs or rubs, no peripheral edema  Respiratory: clear to auscultation bilaterally, no wheezes, no crackles  Abdomen: soft, nontender,  nondistended, normal bowel tones heard  Skin: dry, no rashes  Musculoskeletal: no joint effusions, normal range of motion  Psychiatric: appropriate affect, normal speech  Neurologic: extraocular muscles intact, clear speech, moving all extremities with intact sensorium    Data Reviewed: CBC:  Recent Labs Lab 06/27/16 1802 06/29/16 0545  WBC 8.0 7.5  NEUTROABS 6.5  --   HGB 10.5* 9.5*  HCT 31.9* 29.9*  MCV 92.7 94.3  PLT 230 [id number]   Basic Metabolic Panel:  Recent Labs Lab 06/27/16 1802 06/28/16 0534 06/29/16 0545  NA 137 142 147*  K 3.1* 3.4* 3.8  CL 107 113* 123*  CO2 20* 19* 17*  GLUCOSE 132* 104* 137*  BUN 23* 23* 19  CREATININE 2.01* 1.68* 1.20*  CALCIUM 8.3* 8.3* 8.0*   GFR: Estimated Creatinine Clearance: 34.6 mL/min (by C-G formula based on SCr of 1.2 mg/dL). Liver Function Tests:  Recent Labs Lab 06/27/16 1802 06/28/16 0534  AST 20 17  ALT 18 16  ALKPHOS 62 60  BILITOT 0.6 0.3  PROT 6.6 6.1*  ALBUMIN 3.7 3.6   No results for input(s): LIPASE, AMYLASE in the last 168 hours. No results for input(s): AMMONIA in the last 168 hours. Coagulation Profile: No results for input(s): INR, PROTIME in the last 168 hours. Cardiac Enzymes: No results for input(s): CKTOTAL, CKMB, CKMBINDEX, TROPONINI in the last 168 hours. BNP (last 3 results) No results for input(s): PROBNP in the last 8760 hours. HbA1C: No results for input(s): HGBA1C in the last 72 hours. CBG: No results for input(s): GLUCAP in the last 168 hours. Lipid Profile: No results for input(s): CHOL, HDL, LDLCALC, TRIG, CHOLHDL, LDLDIRECT in the last 72 hours. Thyroid Function Tests: No results for input(s): TSH, T4TOTAL, FREET4, T3FREE, THYROIDAB in the last 72 hours. Anemia Panel: No results for input(s): VITAMINB12, FOLATE, FERRITIN, TIBC, IRON, RETICCTPCT in the last 72 hours. Urine analysis:    Component Value Date/Time   COLORURINE AMBER (A) 03/13/2016 1027   APPEARANCEUR TURBID (A)  03/13/2016 1027   LABSPEC 1.018 03/13/2016 1027   PHURINE 5.5 03/13/2016 1027   GLUCOSEU NEGATIVE 03/13/2016 1027   HGBUR MODERATE (A) 03/13/2016 1027   BILIRUBINUR NEGATIVE 03/13/2016 1027   KETONESUR NEGATIVE 03/13/2016 1027   PROTEINUR NEGATIVE 03/13/2016 1027   UROBILINOGEN 0.2 10/05/2013 1209   NITRITE NEGATIVE 03/13/2016 1027   LEUKOCYTESUR LARGE (A) 03/13/2016 1027   Sepsis Labs: @LABRCNTIP (procalcitonin:4,lacticidven:4)  ) Recent Results (from the past 240 hour(s))  MRSA PCR Screening     Status: None   Collection Time: 06/28/16  3:51 AM  Result Value Ref Range Status   MRSA by PCR NEGATIVE NEGATIVE Final    Comment:        The GeneXpert MRSA Assay (FDA approved for NASAL specimens only), is one component of a comprehensive MRSA colonization surveillance program. It is not intended to  diagnose MRSA infection nor to guide or monitor treatment for MRSA infections.   C difficile quick scan w PCR reflex     Status: None   Collection Time: 06/28/16  8:26 PM  Result Value Ref Range Status   C Diff antigen NEGATIVE NEGATIVE Final   C Diff toxin NEGATIVE NEGATIVE Final   C Diff interpretation No C. difficile detected.  Final  Gastrointestinal Panel by PCR , Stool     Status: None   Collection Time: 06/28/16  8:26 PM  Result Value Ref Range Status   Campylobacter species NOT DETECTED NOT DETECTED Final   Plesimonas shigelloides NOT DETECTED NOT DETECTED Final   Salmonella species NOT DETECTED NOT DETECTED Final   Yersinia enterocolitica NOT DETECTED NOT DETECTED Final   Vibrio species NOT DETECTED NOT DETECTED Final   Vibrio cholerae NOT DETECTED NOT DETECTED Final   Enteroaggregative E coli (EAEC) NOT DETECTED NOT DETECTED Final   Enteropathogenic E coli (EPEC) NOT DETECTED NOT DETECTED Final   Enterotoxigenic E coli (ETEC) NOT DETECTED NOT DETECTED Final   Shiga like toxin producing E coli (STEC) NOT DETECTED NOT DETECTED Final   E. coli O157 NOT DETECTED NOT  DETECTED Final   Shigella/Enteroinvasive E coli (EIEC) NOT DETECTED NOT DETECTED Final   Cryptosporidium NOT DETECTED NOT DETECTED Final   Cyclospora cayetanensis NOT DETECTED NOT DETECTED Final   Entamoeba histolytica NOT DETECTED NOT DETECTED Final   Giardia lamblia NOT DETECTED NOT DETECTED Final   Adenovirus F40/41 NOT DETECTED NOT DETECTED Final   Astrovirus NOT DETECTED NOT DETECTED Final   Norovirus GI/GII NOT DETECTED NOT DETECTED Final   Rotavirus A NOT DETECTED NOT DETECTED Final   Sapovirus (I, II, IV, and V) NOT DETECTED NOT DETECTED Final      Studies: Ct Abdomen Pelvis Wo Contrast  Result Date: 06/28/2016 CLINICAL DATA:  Diffuse abdominal pain EXAM: CT ABDOMEN AND PELVIS WITHOUT CONTRAST TECHNIQUE: Multidetector CT imaging of the abdomen and pelvis was performed following the standard protocol without IV contrast. COMPARISON:  None. FINDINGS: Lower chest: Lung bases are well aerated without focal infiltrate. Mitral valve calcifications are seen. Hepatobiliary: Gallbladder appears of been surgically removed. The liver is within normal limits. Pancreas: Within normal limits. Spleen: Multiple calcified granulomas. No other focal abnormality is seen. Adrenals/Urinary Tract: The adrenal glands are unremarkable. A tiny nonobstructing right lower pole renal stone is seen. The collecting systems and ureters are unremarkable. The bladder is decompressed. Stomach/Bowel: The appendix has been surgically removed. Scattered large and small bowel gas is noted without obstructive change. Diffuse laxity of the anterior abdominal wall is noted although no true hernia is seen. Vascular/Lymphatic: Diffuse aortoiliac calcifications are noted without aneurysmal dilatation. No significant lymphadenopathy is noted. Reproductive: Uterus has been surgically removed. Other: No significant free pelvic fluid or pelvic mass lesion is noted. Musculoskeletal: The osseous structures show degenerative change of the  lumbar spine as well as changes of prior hip replacement on the right. No compression deformities are seen. IMPRESSION: Chronic changes as described above.  No acute abnormality is noted. Electronically Signed   By: Inez Catalina M.D.   On: 06/28/2016 12:00    Scheduled Meds: . budesonide  9 mg Oral Daily  . carvedilol  6.25 mg Oral BID WC  . clonazePAM  0.5 mg Oral BID  . doxepin  50 mg Oral QHS  . feeding supplement (ENSURE ENLIVE)  237 mL Oral BID BM  . folic acid  1 mg Oral Daily  .  glycopyrrolate  2 mg Oral BID  . leflunomide  20 mg Oral Daily  . levothyroxine  75 mcg Oral QAC breakfast  . pantoprazole  40 mg Oral Daily  . predniSONE  10 mg Oral QAC breakfast  . predniSONE  20 mg Oral Q1200  . sertraline  50 mg Oral QHS  . simvastatin  10 mg Oral Daily    Continuous Infusions:     LOS: 1 day   Time spent: 20 minutes  Leisl Spurrier Marry Guan, MD [facility] Pager [phone]  If 7PM-7AM, please contact night-coverage [email or web address] Password [email or web address] 06/29/2016, 10:18 AM

## 2016-06-30 LAB — CBC
HEMATOCRIT: 29.3 % — AB (ref 36.0–46.0)
HEMOGLOBIN: 9.2 g/dL — AB (ref 12.0–15.0)
MCH: 30.2 pg (ref 26.0–34.0)
MCHC: 31.4 g/dL (ref 30.0–36.0)
MCV: 96.1 fL (ref 78.0–100.0)
Platelets: 177 10*3/uL (ref 150–400)
RBC: 3.05 MIL/uL — ABNORMAL LOW (ref 3.87–5.11)
RDW: 17.6 % — AB (ref 11.5–15.5)
WBC: 7.9 10*3/uL (ref 4.0–10.5)

## 2016-06-30 LAB — BASIC METABOLIC PANEL
ANION GAP: 4 — AB (ref 5–15)
BUN: 16 mg/dL (ref 6–20)
CHLORIDE: 118 mmol/L — AB (ref 101–111)
CO2: 21 mmol/L — ABNORMAL LOW (ref 22–32)
Calcium: 8.3 mg/dL — ABNORMAL LOW (ref 8.9–10.3)
Creatinine, Ser: 1.18 mg/dL — ABNORMAL HIGH (ref 0.44–1.00)
GFR calc Af Amer: 50 mL/min — ABNORMAL LOW (ref >60)
GFR, EST NON AFRICAN AMERICAN: 43 mL/min — AB (ref >60)
GLUCOSE: 93 mg/dL (ref 65–99)
POTASSIUM: 3.7 mmol/L (ref 3.5–5.1)
SODIUM: 143 mmol/L (ref 135–145)

## 2016-06-30 MED ORDER — ENSURE ENLIVE PO LIQD: 237.0000 mL | Freq: Two times a day (BID) | ORAL | 12 refills | Status: AC

## 2016-06-30 MED ORDER — LOPERAMIDE HCL 2 MG PO CAPS: 2.0000 mg | ORAL_CAPSULE | ORAL | 0 refills | Status: AC | PRN

## 2016-06-30 MED ORDER — BUDESONIDE 3 MG PO CPEP: 9.0000 mg | ORAL_CAPSULE | Freq: Every day | ORAL | 0 refills | Status: AC

## 2016-06-30 NOTE — Care Management Note (Signed)
Case Management Note  Patient Details  Name: LANDA VASTOLA MRN: [id number] Date of Birth: 1936/01/24  Subjective/Objective:                  Depression and diarrhea  Action/Plan: CM spoke with patient. Patient is being discharge home today. States she will use a cab service to get home. She lives alone with her "husband's cat." Her family lives in [place] and another city. She states she will be fine at home since [facility] visits her twice a week. CM spoke with Tommi Emery at [place] [facility]. [facility] will continue to provide patient's home health services.   Expected Discharge Date:   06/30/16               Expected Discharge Plan:  [place]  In-House Referral:     Discharge planning Services  CM Consult  Post Acute Care Choice:  Resumption of Svcs/PTA Provider Choice offered to:  Patient  DME Arranged:    DME Agency:     HH Arranged:    [place]:  [facility] (now [facility])  Status of Service:  Completed, signed off  If discussed at [employer] of Stay Meetings, dates discussed:    Additional Comments:  Apolonio Schneiders, RN 06/30/2016, 10:59 AM

## 2016-06-30 NOTE — Progress Notes (Addendum)
CSW received consult for transportation home via cab voucher. CSW assessed patient. Patient shares that she lives at home alone, however has a strong support system of a neighbor next door. Resdient uses a walker, her walker is in the house. Resident shares that she feels confident she can get out of the cab independently and has chairs in the drive way to sit and rest if needed. She feels she can also ambulate up the 3-4 stairs to her front door as they have rails and she is use to doing so. CSW discussed with RN, who plans to call cab when she completes discharge with patient.   Patient very complimentary of staff and the team.  Assunta Gambles Cab [place], [place] Work   [phone]

## 2016-06-30 NOTE — Discharge Instructions (Signed)

## 2016-07-02 DIAGNOSIS — M79604 Pain in right leg: Secondary | ICD-10-CM | POA: Diagnosis not present

## 2016-07-02 DIAGNOSIS — E119 Type 2 diabetes mellitus without complications: Secondary | ICD-10-CM | POA: Diagnosis not present

## 2016-07-02 DIAGNOSIS — I5022 Chronic systolic (congestive) heart failure: Secondary | ICD-10-CM | POA: Diagnosis not present

## 2016-07-02 DIAGNOSIS — I110 Hypertensive heart disease with heart failure: Secondary | ICD-10-CM | POA: Diagnosis not present

## 2016-07-02 DIAGNOSIS — F329 Major depressive disorder, single episode, unspecified: Secondary | ICD-10-CM | POA: Diagnosis not present

## 2016-07-02 DIAGNOSIS — S61401D Unspecified open wound of right hand, subsequent encounter: Secondary | ICD-10-CM | POA: Diagnosis not present

## 2016-07-03 DIAGNOSIS — I110 Hypertensive heart disease with heart failure: Secondary | ICD-10-CM | POA: Diagnosis not present

## 2016-07-03 DIAGNOSIS — E119 Type 2 diabetes mellitus without complications: Secondary | ICD-10-CM | POA: Diagnosis not present

## 2016-07-03 DIAGNOSIS — M79604 Pain in right leg: Secondary | ICD-10-CM | POA: Diagnosis not present

## 2016-07-03 DIAGNOSIS — I5022 Chronic systolic (congestive) heart failure: Secondary | ICD-10-CM | POA: Diagnosis not present

## 2016-07-03 DIAGNOSIS — S61401D Unspecified open wound of right hand, subsequent encounter: Secondary | ICD-10-CM | POA: Diagnosis not present

## 2016-07-03 DIAGNOSIS — F329 Major depressive disorder, single episode, unspecified: Secondary | ICD-10-CM | POA: Diagnosis not present

## 2016-07-05 NOTE — Discharge Summary (Signed)
Discharge Summary  Olivia Mullen [id number] DOB: [birth year]  PCP: [name], MD  Admit date: 06/27/2016 Discharge date: 07/05/2016   Recommendations for Outpatient Follow-up:  1. PCP 1-2 weeks   Discharge Diagnoses:  Active Hospital Problems   Diagnosis Date Noted  . AKI (acute kidney injury) ([place]) 06/28/2016  . Protein-calorie malnutrition, severe 06/29/2016  . Diarrhea 06/28/2016  . Chronic systolic heart failure ([place])   . Hypothyroidism (acquired) 12/16/2011  . DM type 2 (diabetes mellitus, type 2) ([place]) 12/16/2011  . Breast cancer, stage 2 ([place]) 12/16/2011    Resolved Hospital Problems   Diagnosis Date Noted Date Resolved  No resolved problems to display.    Discharge Condition: Stable   Diet recommendation: Regular   Vitals:   06/29/16 2053 06/30/16 0647  BP: (!) 156/66 (!) 156/54  Pulse: 96 97  Resp: 16 17  Temp: 98.3 F (36.8 C) 98.9 F (37.2 C)    History of present illness:  Olivia Mullen a 80 y.o.femalewith collagenous colitis,was admitted in May 2017 for GI bleed with gastric ulcer, history of chronic systolic failure, hypertension, hyperlipidemia, hypothyroidism was brought to the ER after patient was found to be depressed. Patient has lost her husband is February 7 months ago. Since then patient has been having depression. Patient has history of collagenous colitis and has been experiencing more than usual diarrhea for last 1 week. In the ER patient had at least 3 episodes of diarrhea and patient states she had initially some nausea vomiting 1 week ago. Patient 2 weeks ago was placed on antibiotics for right hand wound and since then patient has developed increased diarrhea. Labs revealed AKI which is improving with IVF. She says her diarrhea is now as bad as it chronically is, though nursing says she is unable to get to the bathroom on time.     Hospital Course:  Principal Problem:   AKI (acute kidney injury) ([place]) Active  Problems:   Breast cancer, stage 2 ([place])   Hypothyroidism (acquired)   DM type 2 (diabetes mellitus, type 2) (H[facility])   Chronic systolic heart failure (H[facility])   Diarrhea   Protein-calorie malnutrition, severe  1. Acute renal failure with non-anion gap metabolic acidosisprobably from poor oral intake and severe diarrhea. Creatinine improved with hydration and held Lasix and Cozaar.  2. Diarrheawith history of collagenous colitis - patient states she did missed3 days of her prednisone when she had nausea vomiting last week. Since patient also was recently on antibiotics we checked stool studies for C. difficile and also GI pathogens. These were negative. Patient does complain of some lower quadrant pain so CT scan done without acute findings. Patient says her abdominal cramping and diarrhea are no different from her usual baseline. Discussed 9/9/ with GI on call for [facility], recommend Budesonide 9mg  PO daily and we have started this. Since no infection I've also started Immodium PRN. Today on day of discharge her diarrhea is much improved, she will continue the above regimen until she follows up with GI later this week. 3. Depression/grief- feels better after chaplain visit 9/8. No suicidal ideation. 4. History of chronic systolic heart failureEF measured in 2013 was 20-25% and in 2016 was 50-55%. Holding off Lasix and Cozaar due to acute renal failure. Continue Coreg. 5. Hypothyroidismon Synthroid. 6. Chronic anemia with history of GI bleed in May 2017secondary to gastric ulcer - on PPI. Follow CBC. 7. Hyperlipidemiaon statins.  Procedures:  None   Consultations:  GI by phone   Discharge  Exam: BP (!) 156/54 (BP Location: Left Arm)   Pulse 97   Temp 98.9 F (37.2 C) (Oral)   Resp 17   Ht 5\' 2"  (1.575 m)   Wt 67.2 kg (148 lb 2.4 oz)   SpO2 94%   BMI 27.10 kg/m  General:  Alert, oriented, calm, in no acute distress  Eyes: pupils round and reactive to light and accomodation, clear  sclerea Neck: supple, no masses, trachea mildline  Cardiovascular: RRR, no murmurs or rubs, no peripheral edema  Respiratory: clear to auscultation bilaterally, no wheezes, no crackles  Abdomen: soft, nontender, nondistended, normal bowel tones heard  Skin: dry, no rashes  Musculoskeletal: no joint effusions, normal range of motion  Psychiatric: appropriate affect, normal speech  Neurologic: extraocular muscles intact, clear speech, moving all extremities with intact sensorium    Discharge Instructions You were cared for by a hospitalist during your hospital stay. If you have any questions about your discharge medications or the care you received while you were in the hospital after you are discharged, you can call the unit and asked to speak with the hospitalist on call if the hospitalist that took care of you is not available. Once you are discharged, your primary care physician will handle any further medical issues. Please note that NO REFILLS for any discharge medications will be authorized once you are discharged, as it is imperative that you return to your primary care physician (or establish a relationship with a primary care physician if you do not have one) for your aftercare needs so that they can reassess your need for medications and monitor your lab values.  Discharge Instructions    Diet - low sodium heart healthy    Complete by:  As directed    Face-to-face encounter (required for Medicare/Medicaid patients)    Complete by:  As directed    I Olivia Mullen certify that this patient is under my care and that I, or a nurse practitioner or physician's assistant working with me, had a face-to-face encounter that meets the physician face-to-face encounter requirements with this patient on 06/30/2016. The encounter with the patient was in whole, or in part for the following medical condition(s) which is the primary reason for home health care (List medical condition):  dehydration, diarrhea, ambulatory dysfunction.   The encounter with the patient was in whole, or in part, for the following medical condition, which is the primary reason for home health care:  acute renail failure, diarrhea   I certify that, based on my findings, the following services are medically necessary home health services:   Nursing Physical therapy     Reason for Medically Necessary Home Health Services:  Skilled Nursing- Change/Decline in Patient Status   My clinical findings support the need for the above services:  Unable to leave home safely without assistance and/or assistive device   Further, I certify that my clinical findings support that this patient is homebound due to:  Unable to leave home safely without assistance   Home Health    Complete by:  As directed    To provide the following care/treatments:   [facility]     Increase activity slowly    Complete by:  As directed        Medication List    STOP taking these medications   feeding supplement (PRO-STAT SUGAR FREE 64) Liqd   furosemide 20 MG tablet Commonly known as:  LASIX   KLOR-CON M20 20 MEQ tablet  Generic drug:  potassium chloride SA   losartan 25 MG tablet Commonly known as:  COZAAR   predniSONE 10 MG tablet Commonly known as:  DELTASONE     TAKE these medications   acetaminophen 500 MG tablet Commonly known as:  TYLENOL Take 1,000 mg by mouth every 6 (six) hours as needed for moderate pain.   ADULT GUMMY PO Take 2 tablets by mouth daily.   budesonide 3 MG 24 hr capsule Commonly known as:  ENTOCORT EC Take 3 capsules (9 mg total) by mouth daily.   butalbital-acetaminophen-caffeine 50-325-40 MG tablet Commonly known as:  FIORICET, ESGIC Take 1 tablet by mouth 2 (two) times daily as needed for headache.   carvedilol 6.25 MG tablet Commonly known as:  COREG TAKE 1 TABLET (6.25 MG TOTAL) BY MOUTH 2 (TWO) TIMES DAILY WITH A MEAL.   cholecalciferol 1000 units  tablet Commonly known as:  VITAMIN D Take 2,000 Units by mouth daily.   doxepin 50 MG capsule Commonly known as:  SINEQUAN Take 50 mg by mouth at bedtime.   feeding supplement (ENSURE ENLIVE) Liqd Take 237 mLs by mouth 2 (two) times daily between meals.   folic acid 1 MG tablet Commonly known as:  FOLVITE Take 1 mg by mouth daily.   glycopyrrolate 2 MG tablet Commonly known as:  ROBINUL Take 2 mg by mouth 2 (two) times daily.   HUMIRA PEN 40 MG/0.8ML injection Generic drug:  adalimumab 1 injection every other week   KLONOPIN 0.5 MG tablet Generic drug:  clonazePAM Take 0.5 mg by mouth 2 (two) times daily.   leflunomide 20 MG tablet Commonly known as:  ARAVA Take 20 mg by mouth daily.   levothyroxine 75 MCG tablet Commonly known as:  SYNTHROID, LEVOTHROID Take 75 mcg by mouth daily before breakfast.   loperamide 2 MG capsule Commonly known as:  IMODIUM Take 1 capsule (2 mg total) by mouth as needed for diarrhea or loose stools.   pantoprazole 40 MG tablet Commonly known as:  PROTONIX Take 1 tablet (40 mg total) by mouth 2 (two) times daily. What changed:  when to take this   PROBIOTIC DAILY PO Take 2 tablets by mouth daily.   sertraline 50 MG tablet Commonly known as:  ZOLOFT Take 50 mg by mouth at bedtime.   simvastatin 10 MG tablet Commonly known as:  ZOCOR Take 10 mg by mouth daily.      Allergies  Allergen Reactions  . Gabapentin Itching  . Isosorbide Nitrate Itching  . Wellbutrin [Bupropion] Other (See Comments)    mental changes and increase anger  . Zonisamide Itching      The results of significant diagnostics from this hospitalization (including imaging, microbiology, ancillary and laboratory) are listed below for reference.    Significant Diagnostic Studies: Ct Abdomen Pelvis Wo Contrast  Result Date: 06/28/2016 CLINICAL DATA:  Diffuse abdominal pain EXAM: CT ABDOMEN AND PELVIS WITHOUT CONTRAST TECHNIQUE: Multidetector CT imaging of the  abdomen and pelvis was performed following the standard protocol without IV contrast. COMPARISON:  None. FINDINGS: Lower chest: Lung bases are well aerated without focal infiltrate. Mitral valve calcifications are seen. Hepatobiliary: Gallbladder appears of been surgically removed. The liver is within normal limits. Pancreas: Within normal limits. Spleen: Multiple calcified granulomas. No other focal abnormality is seen. Adrenals/Urinary Tract: The adrenal glands are unremarkable. A tiny nonobstructing right lower pole renal stone is seen. The collecting systems and ureters are unremarkable. The bladder is decompressed. Stomach/Bowel: The appendix has been surgically  removed. Scattered large and small bowel gas is noted without obstructive change. Diffuse laxity of the anterior abdominal wall is noted although no true hernia is seen. Vascular/Lymphatic: Diffuse aortoiliac calcifications are noted without aneurysmal dilatation. No significant lymphadenopathy is noted. Reproductive: Uterus has been surgically removed. Other: No significant free pelvic fluid or pelvic mass lesion is noted. Musculoskeletal: The osseous structures show degenerative change of the lumbar spine as well as changes of prior hip replacement on the right. No compression deformities are seen. IMPRESSION: Chronic changes as described above.  No acute abnormality is noted. Electronically Signed   By: Inez Catalina M.D.   On: 06/28/2016 12:00    Microbiology: Recent Results (from the past 240 hour(s))  MRSA PCR Screening     Status: None   Collection Time: 06/28/16  3:51 AM  Result Value Ref Range Status   MRSA by PCR NEGATIVE NEGATIVE Final    Comment:        The GeneXpert MRSA Assay (FDA approved for NASAL specimens only), is one component of a comprehensive MRSA colonization surveillance program. It is not intended to diagnose MRSA infection nor to guide or monitor treatment for MRSA infections.   C difficile quick scan w PCR  reflex     Status: None   Collection Time: 06/28/16  8:26 PM  Result Value Ref Range Status   C Diff antigen NEGATIVE NEGATIVE Final   C Diff toxin NEGATIVE NEGATIVE Final   C Diff interpretation No C. difficile detected.  Final  Gastrointestinal Panel by PCR , Stool     Status: None   Collection Time: 06/28/16  8:26 PM  Result Value Ref Range Status   Campylobacter species NOT DETECTED NOT DETECTED Final   Plesimonas shigelloides NOT DETECTED NOT DETECTED Final   Salmonella species NOT DETECTED NOT DETECTED Final   Yersinia enterocolitica NOT DETECTED NOT DETECTED Final   Vibrio species NOT DETECTED NOT DETECTED Final   Vibrio cholerae NOT DETECTED NOT DETECTED Final   Enteroaggregative E coli (EAEC) NOT DETECTED NOT DETECTED Final   Enteropathogenic E coli (EPEC) NOT DETECTED NOT DETECTED Final   Enterotoxigenic E coli (ETEC) NOT DETECTED NOT DETECTED Final   Shiga like toxin producing E coli (STEC) NOT DETECTED NOT DETECTED Final   E. coli O157 NOT DETECTED NOT DETECTED Final   Shigella/Enteroinvasive E coli (EIEC) NOT DETECTED NOT DETECTED Final   Cryptosporidium NOT DETECTED NOT DETECTED Final   Cyclospora cayetanensis NOT DETECTED NOT DETECTED Final   Entamoeba histolytica NOT DETECTED NOT DETECTED Final   Giardia lamblia NOT DETECTED NOT DETECTED Final   Adenovirus F40/41 NOT DETECTED NOT DETECTED Final   Astrovirus NOT DETECTED NOT DETECTED Final   Norovirus GI/GII NOT DETECTED NOT DETECTED Final   Rotavirus A NOT DETECTED NOT DETECTED Final   Sapovirus (I, II, IV, and V) NOT DETECTED NOT DETECTED Final     Labs: Basic Metabolic Panel:  Recent Labs Lab 06/29/16 0545 06/30/16 0500  NA 147* 143  K 3.8 3.7  CL 123* 118*  CO2 17* 21*  GLUCOSE 137* 93  BUN 19 16  CREATININE 1.20* 1.18*  CALCIUM 8.0* 8.3*   Liver Function Tests: No results for input(s): AST, ALT, ALKPHOS, BILITOT, PROT, ALBUMIN in the last 168 hours. No results for input(s): LIPASE, AMYLASE in the  last 168 hours. No results for input(s): AMMONIA in the last 168 hours. CBC:  Recent Labs Lab 06/29/16 0545 06/30/16 0500  WBC 7.5 7.9  HGB 9.5* 9.2*  HCT 29.9* 29.3*  MCV 94.3 96.1  PLT 192 177   Cardiac Enzymes: No results for input(s): CKTOTAL, CKMB, CKMBINDEX, TROPONINI in the last 168 hours. BNP: BNP (last 3 results)  Recent Labs  03/11/16 1550  BNP 60.9    ProBNP (last 3 results) No results for input(s): PROBNP in the last 8760 hours.  CBG: No results for input(s): GLUCAP in the last 168 hours.  Time spent: 41 minutes were spent in preparing this discharge including medication reconciliation, counseling, and coordination of care.  Signed:  Maxfield Gildersleeve [employer]  [facility] 07/05/2016, 8:44 AM

## 2016-07-08 DIAGNOSIS — I5022 Chronic systolic (congestive) heart failure: Secondary | ICD-10-CM | POA: Diagnosis not present

## 2016-07-08 DIAGNOSIS — M79604 Pain in right leg: Secondary | ICD-10-CM | POA: Diagnosis not present

## 2016-07-08 DIAGNOSIS — S61401D Unspecified open wound of right hand, subsequent encounter: Secondary | ICD-10-CM | POA: Diagnosis not present

## 2016-07-08 DIAGNOSIS — E119 Type 2 diabetes mellitus without complications: Secondary | ICD-10-CM | POA: Diagnosis not present

## 2016-07-08 DIAGNOSIS — I110 Hypertensive heart disease with heart failure: Secondary | ICD-10-CM | POA: Diagnosis not present

## 2016-07-08 DIAGNOSIS — F329 Major depressive disorder, single episode, unspecified: Secondary | ICD-10-CM | POA: Diagnosis not present

## 2016-07-09 DIAGNOSIS — F329 Major depressive disorder, single episode, unspecified: Secondary | ICD-10-CM | POA: Diagnosis not present

## 2016-07-09 DIAGNOSIS — Z23 Encounter for immunization: Secondary | ICD-10-CM | POA: Diagnosis not present

## 2016-07-09 DIAGNOSIS — R197 Diarrhea, unspecified: Secondary | ICD-10-CM | POA: Diagnosis not present

## 2016-07-09 DIAGNOSIS — I10 Essential (primary) hypertension: Secondary | ICD-10-CM | POA: Diagnosis not present

## 2016-07-11 DIAGNOSIS — E119 Type 2 diabetes mellitus without complications: Secondary | ICD-10-CM | POA: Diagnosis not present

## 2016-07-11 DIAGNOSIS — I110 Hypertensive heart disease with heart failure: Secondary | ICD-10-CM | POA: Diagnosis not present

## 2016-07-11 DIAGNOSIS — M79604 Pain in right leg: Secondary | ICD-10-CM | POA: Diagnosis not present

## 2016-07-11 DIAGNOSIS — S61401D Unspecified open wound of right hand, subsequent encounter: Secondary | ICD-10-CM | POA: Diagnosis not present

## 2016-07-11 DIAGNOSIS — F329 Major depressive disorder, single episode, unspecified: Secondary | ICD-10-CM | POA: Diagnosis not present

## 2016-07-11 DIAGNOSIS — I5022 Chronic systolic (congestive) heart failure: Secondary | ICD-10-CM | POA: Diagnosis not present

## 2016-07-15 DIAGNOSIS — M79604 Pain in right leg: Secondary | ICD-10-CM | POA: Diagnosis not present

## 2016-07-15 DIAGNOSIS — S61401D Unspecified open wound of right hand, subsequent encounter: Secondary | ICD-10-CM | POA: Diagnosis not present

## 2016-07-15 DIAGNOSIS — F329 Major depressive disorder, single episode, unspecified: Secondary | ICD-10-CM | POA: Diagnosis not present

## 2016-07-15 DIAGNOSIS — E119 Type 2 diabetes mellitus without complications: Secondary | ICD-10-CM | POA: Diagnosis not present

## 2016-07-15 DIAGNOSIS — I110 Hypertensive heart disease with heart failure: Secondary | ICD-10-CM | POA: Diagnosis not present

## 2016-07-15 DIAGNOSIS — I5022 Chronic systolic (congestive) heart failure: Secondary | ICD-10-CM | POA: Diagnosis not present

## 2016-07-18 DIAGNOSIS — I110 Hypertensive heart disease with heart failure: Secondary | ICD-10-CM | POA: Diagnosis not present

## 2016-07-18 DIAGNOSIS — E119 Type 2 diabetes mellitus without complications: Secondary | ICD-10-CM | POA: Diagnosis not present

## 2016-07-18 DIAGNOSIS — S61401D Unspecified open wound of right hand, subsequent encounter: Secondary | ICD-10-CM | POA: Diagnosis not present

## 2016-07-18 DIAGNOSIS — I5022 Chronic systolic (congestive) heart failure: Secondary | ICD-10-CM | POA: Diagnosis not present

## 2016-07-18 DIAGNOSIS — M79604 Pain in right leg: Secondary | ICD-10-CM | POA: Diagnosis not present

## 2016-07-18 DIAGNOSIS — F329 Major depressive disorder, single episode, unspecified: Secondary | ICD-10-CM | POA: Diagnosis not present

## 2016-07-19 DIAGNOSIS — S61401D Unspecified open wound of right hand, subsequent encounter: Secondary | ICD-10-CM | POA: Diagnosis not present

## 2016-07-19 DIAGNOSIS — E119 Type 2 diabetes mellitus without complications: Secondary | ICD-10-CM | POA: Diagnosis not present

## 2016-07-19 DIAGNOSIS — F329 Major depressive disorder, single episode, unspecified: Secondary | ICD-10-CM | POA: Diagnosis not present

## 2016-07-19 DIAGNOSIS — M79604 Pain in right leg: Secondary | ICD-10-CM | POA: Diagnosis not present

## 2016-07-19 DIAGNOSIS — I5022 Chronic systolic (congestive) heart failure: Secondary | ICD-10-CM | POA: Diagnosis not present

## 2016-07-19 DIAGNOSIS — I110 Hypertensive heart disease with heart failure: Secondary | ICD-10-CM | POA: Diagnosis not present

## 2016-07-21 ENCOUNTER — Other Ambulatory Visit: Payer: Self-pay | Admitting: Interventional Cardiology

## 2016-07-22 DIAGNOSIS — I110 Hypertensive heart disease with heart failure: Secondary | ICD-10-CM | POA: Diagnosis not present

## 2016-07-22 DIAGNOSIS — E119 Type 2 diabetes mellitus without complications: Secondary | ICD-10-CM | POA: Diagnosis not present

## 2016-07-22 DIAGNOSIS — I5022 Chronic systolic (congestive) heart failure: Secondary | ICD-10-CM | POA: Diagnosis not present

## 2016-07-22 DIAGNOSIS — S61401D Unspecified open wound of right hand, subsequent encounter: Secondary | ICD-10-CM | POA: Diagnosis not present

## 2016-07-22 DIAGNOSIS — M79604 Pain in right leg: Secondary | ICD-10-CM | POA: Diagnosis not present

## 2016-07-22 DIAGNOSIS — F329 Major depressive disorder, single episode, unspecified: Secondary | ICD-10-CM | POA: Diagnosis not present

## 2016-07-23 DIAGNOSIS — R197 Diarrhea, unspecified: Secondary | ICD-10-CM | POA: Diagnosis not present

## 2016-07-23 DIAGNOSIS — K5289 Other specified noninfective gastroenteritis and colitis: Secondary | ICD-10-CM | POA: Diagnosis not present

## 2016-07-23 DIAGNOSIS — K529 Noninfective gastroenteritis and colitis, unspecified: Secondary | ICD-10-CM | POA: Diagnosis not present

## 2016-07-24 DIAGNOSIS — E119 Type 2 diabetes mellitus without complications: Secondary | ICD-10-CM | POA: Diagnosis not present

## 2016-07-24 DIAGNOSIS — R197 Diarrhea, unspecified: Secondary | ICD-10-CM | POA: Diagnosis not present

## 2016-07-24 DIAGNOSIS — S61401D Unspecified open wound of right hand, subsequent encounter: Secondary | ICD-10-CM | POA: Diagnosis not present

## 2016-07-24 DIAGNOSIS — I5022 Chronic systolic (congestive) heart failure: Secondary | ICD-10-CM | POA: Diagnosis not present

## 2016-07-24 DIAGNOSIS — M79604 Pain in right leg: Secondary | ICD-10-CM | POA: Diagnosis not present

## 2016-07-24 DIAGNOSIS — I110 Hypertensive heart disease with heart failure: Secondary | ICD-10-CM | POA: Diagnosis not present

## 2016-07-24 DIAGNOSIS — F329 Major depressive disorder, single episode, unspecified: Secondary | ICD-10-CM | POA: Diagnosis not present

## 2016-07-26 DIAGNOSIS — S61401D Unspecified open wound of right hand, subsequent encounter: Secondary | ICD-10-CM | POA: Diagnosis not present

## 2016-07-26 DIAGNOSIS — I5022 Chronic systolic (congestive) heart failure: Secondary | ICD-10-CM | POA: Diagnosis not present

## 2016-07-26 DIAGNOSIS — F329 Major depressive disorder, single episode, unspecified: Secondary | ICD-10-CM | POA: Diagnosis not present

## 2016-07-26 DIAGNOSIS — E119 Type 2 diabetes mellitus without complications: Secondary | ICD-10-CM | POA: Diagnosis not present

## 2016-07-26 DIAGNOSIS — M79604 Pain in right leg: Secondary | ICD-10-CM | POA: Diagnosis not present

## 2016-07-26 DIAGNOSIS — I110 Hypertensive heart disease with heart failure: Secondary | ICD-10-CM | POA: Diagnosis not present

## 2016-07-30 DIAGNOSIS — F329 Major depressive disorder, single episode, unspecified: Secondary | ICD-10-CM | POA: Diagnosis not present

## 2016-07-30 DIAGNOSIS — M79604 Pain in right leg: Secondary | ICD-10-CM | POA: Diagnosis not present

## 2016-07-30 DIAGNOSIS — K5289 Other specified noninfective gastroenteritis and colitis: Secondary | ICD-10-CM | POA: Diagnosis not present

## 2016-07-30 DIAGNOSIS — I5022 Chronic systolic (congestive) heart failure: Secondary | ICD-10-CM | POA: Diagnosis not present

## 2016-07-30 DIAGNOSIS — I110 Hypertensive heart disease with heart failure: Secondary | ICD-10-CM | POA: Diagnosis not present

## 2016-07-30 DIAGNOSIS — S61401D Unspecified open wound of right hand, subsequent encounter: Secondary | ICD-10-CM | POA: Diagnosis not present

## 2016-07-30 DIAGNOSIS — E119 Type 2 diabetes mellitus without complications: Secondary | ICD-10-CM | POA: Diagnosis not present

## 2016-08-01 DIAGNOSIS — I110 Hypertensive heart disease with heart failure: Secondary | ICD-10-CM | POA: Diagnosis not present

## 2016-08-01 DIAGNOSIS — F329 Major depressive disorder, single episode, unspecified: Secondary | ICD-10-CM | POA: Diagnosis not present

## 2016-08-01 DIAGNOSIS — S61401D Unspecified open wound of right hand, subsequent encounter: Secondary | ICD-10-CM | POA: Diagnosis not present

## 2016-08-01 DIAGNOSIS — M79604 Pain in right leg: Secondary | ICD-10-CM | POA: Diagnosis not present

## 2016-08-01 DIAGNOSIS — I5022 Chronic systolic (congestive) heart failure: Secondary | ICD-10-CM | POA: Diagnosis not present

## 2016-08-01 DIAGNOSIS — E119 Type 2 diabetes mellitus without complications: Secondary | ICD-10-CM | POA: Diagnosis not present

## 2016-08-05 DIAGNOSIS — I5022 Chronic systolic (congestive) heart failure: Secondary | ICD-10-CM | POA: Diagnosis not present

## 2016-08-05 DIAGNOSIS — F329 Major depressive disorder, single episode, unspecified: Secondary | ICD-10-CM | POA: Diagnosis not present

## 2016-08-05 DIAGNOSIS — E119 Type 2 diabetes mellitus without complications: Secondary | ICD-10-CM | POA: Diagnosis not present

## 2016-08-05 DIAGNOSIS — S61401D Unspecified open wound of right hand, subsequent encounter: Secondary | ICD-10-CM | POA: Diagnosis not present

## 2016-08-05 DIAGNOSIS — M79604 Pain in right leg: Secondary | ICD-10-CM | POA: Diagnosis not present

## 2016-08-05 DIAGNOSIS — I110 Hypertensive heart disease with heart failure: Secondary | ICD-10-CM | POA: Diagnosis not present

## 2016-08-06 ENCOUNTER — Other Ambulatory Visit: Payer: Self-pay | Admitting: Interventional Cardiology

## 2016-08-06 DIAGNOSIS — E119 Type 2 diabetes mellitus without complications: Secondary | ICD-10-CM | POA: Diagnosis not present

## 2016-08-06 DIAGNOSIS — S61401D Unspecified open wound of right hand, subsequent encounter: Secondary | ICD-10-CM | POA: Diagnosis not present

## 2016-08-06 DIAGNOSIS — I5022 Chronic systolic (congestive) heart failure: Secondary | ICD-10-CM | POA: Diagnosis not present

## 2016-08-06 DIAGNOSIS — F329 Major depressive disorder, single episode, unspecified: Secondary | ICD-10-CM | POA: Diagnosis not present

## 2016-08-06 DIAGNOSIS — M79604 Pain in right leg: Secondary | ICD-10-CM | POA: Diagnosis not present

## 2016-08-06 DIAGNOSIS — I110 Hypertensive heart disease with heart failure: Secondary | ICD-10-CM | POA: Diagnosis not present

## 2016-08-08 DIAGNOSIS — M79604 Pain in right leg: Secondary | ICD-10-CM | POA: Diagnosis not present

## 2016-08-08 DIAGNOSIS — S61401D Unspecified open wound of right hand, subsequent encounter: Secondary | ICD-10-CM | POA: Diagnosis not present

## 2016-08-08 DIAGNOSIS — I110 Hypertensive heart disease with heart failure: Secondary | ICD-10-CM | POA: Diagnosis not present

## 2016-08-08 DIAGNOSIS — F329 Major depressive disorder, single episode, unspecified: Secondary | ICD-10-CM | POA: Diagnosis not present

## 2016-08-08 DIAGNOSIS — E119 Type 2 diabetes mellitus without complications: Secondary | ICD-10-CM | POA: Diagnosis not present

## 2016-08-08 DIAGNOSIS — I5022 Chronic systolic (congestive) heart failure: Secondary | ICD-10-CM | POA: Diagnosis not present

## 2016-08-09 DIAGNOSIS — E119 Type 2 diabetes mellitus without complications: Secondary | ICD-10-CM | POA: Diagnosis not present

## 2016-08-09 DIAGNOSIS — F329 Major depressive disorder, single episode, unspecified: Secondary | ICD-10-CM | POA: Diagnosis not present

## 2016-08-09 DIAGNOSIS — I110 Hypertensive heart disease with heart failure: Secondary | ICD-10-CM | POA: Diagnosis not present

## 2016-08-09 DIAGNOSIS — S61401D Unspecified open wound of right hand, subsequent encounter: Secondary | ICD-10-CM | POA: Diagnosis not present

## 2016-08-09 DIAGNOSIS — I5022 Chronic systolic (congestive) heart failure: Secondary | ICD-10-CM | POA: Diagnosis not present

## 2016-08-09 DIAGNOSIS — M79604 Pain in right leg: Secondary | ICD-10-CM | POA: Diagnosis not present

## 2016-08-13 DIAGNOSIS — E119 Type 2 diabetes mellitus without complications: Secondary | ICD-10-CM | POA: Diagnosis not present

## 2016-08-13 DIAGNOSIS — I5022 Chronic systolic (congestive) heart failure: Secondary | ICD-10-CM | POA: Diagnosis not present

## 2016-08-13 DIAGNOSIS — F329 Major depressive disorder, single episode, unspecified: Secondary | ICD-10-CM | POA: Diagnosis not present

## 2016-08-13 DIAGNOSIS — S61401D Unspecified open wound of right hand, subsequent encounter: Secondary | ICD-10-CM | POA: Diagnosis not present

## 2016-08-13 DIAGNOSIS — I110 Hypertensive heart disease with heart failure: Secondary | ICD-10-CM | POA: Diagnosis not present

## 2016-08-13 DIAGNOSIS — M79604 Pain in right leg: Secondary | ICD-10-CM | POA: Diagnosis not present

## 2016-08-14 DIAGNOSIS — E119 Type 2 diabetes mellitus without complications: Secondary | ICD-10-CM | POA: Diagnosis not present

## 2016-08-14 DIAGNOSIS — S61401D Unspecified open wound of right hand, subsequent encounter: Secondary | ICD-10-CM | POA: Diagnosis not present

## 2016-08-14 DIAGNOSIS — I5022 Chronic systolic (congestive) heart failure: Secondary | ICD-10-CM | POA: Diagnosis not present

## 2016-08-14 DIAGNOSIS — F329 Major depressive disorder, single episode, unspecified: Secondary | ICD-10-CM | POA: Diagnosis not present

## 2016-08-14 DIAGNOSIS — I110 Hypertensive heart disease with heart failure: Secondary | ICD-10-CM | POA: Diagnosis not present

## 2016-08-14 DIAGNOSIS — M79604 Pain in right leg: Secondary | ICD-10-CM | POA: Diagnosis not present

## 2016-08-15 DIAGNOSIS — E119 Type 2 diabetes mellitus without complications: Secondary | ICD-10-CM | POA: Diagnosis not present

## 2016-08-15 DIAGNOSIS — M79604 Pain in right leg: Secondary | ICD-10-CM | POA: Diagnosis not present

## 2016-08-15 DIAGNOSIS — S61401D Unspecified open wound of right hand, subsequent encounter: Secondary | ICD-10-CM | POA: Diagnosis not present

## 2016-08-15 DIAGNOSIS — F329 Major depressive disorder, single episode, unspecified: Secondary | ICD-10-CM | POA: Diagnosis not present

## 2016-08-15 DIAGNOSIS — I5022 Chronic systolic (congestive) heart failure: Secondary | ICD-10-CM | POA: Diagnosis not present

## 2016-08-15 DIAGNOSIS — I110 Hypertensive heart disease with heart failure: Secondary | ICD-10-CM | POA: Diagnosis not present

## 2016-08-16 DIAGNOSIS — F329 Major depressive disorder, single episode, unspecified: Secondary | ICD-10-CM | POA: Diagnosis not present

## 2016-08-16 DIAGNOSIS — S61401D Unspecified open wound of right hand, subsequent encounter: Secondary | ICD-10-CM | POA: Diagnosis not present

## 2016-08-16 DIAGNOSIS — I5022 Chronic systolic (congestive) heart failure: Secondary | ICD-10-CM | POA: Diagnosis not present

## 2016-08-16 DIAGNOSIS — E119 Type 2 diabetes mellitus without complications: Secondary | ICD-10-CM | POA: Diagnosis not present

## 2016-08-16 DIAGNOSIS — I110 Hypertensive heart disease with heart failure: Secondary | ICD-10-CM | POA: Diagnosis not present

## 2016-08-16 DIAGNOSIS — M79604 Pain in right leg: Secondary | ICD-10-CM | POA: Diagnosis not present

## 2016-08-20 DIAGNOSIS — E119 Type 2 diabetes mellitus without complications: Secondary | ICD-10-CM | POA: Diagnosis not present

## 2016-08-20 DIAGNOSIS — S61401D Unspecified open wound of right hand, subsequent encounter: Secondary | ICD-10-CM | POA: Diagnosis not present

## 2016-08-20 DIAGNOSIS — I5022 Chronic systolic (congestive) heart failure: Secondary | ICD-10-CM | POA: Diagnosis not present

## 2016-08-20 DIAGNOSIS — M79604 Pain in right leg: Secondary | ICD-10-CM | POA: Diagnosis not present

## 2016-08-20 DIAGNOSIS — F329 Major depressive disorder, single episode, unspecified: Secondary | ICD-10-CM | POA: Diagnosis not present

## 2016-08-20 DIAGNOSIS — I110 Hypertensive heart disease with heart failure: Secondary | ICD-10-CM | POA: Diagnosis not present

## 2016-08-22 DIAGNOSIS — M79604 Pain in right leg: Secondary | ICD-10-CM | POA: Diagnosis not present

## 2016-08-22 DIAGNOSIS — E119 Type 2 diabetes mellitus without complications: Secondary | ICD-10-CM | POA: Diagnosis not present

## 2016-08-22 DIAGNOSIS — I110 Hypertensive heart disease with heart failure: Secondary | ICD-10-CM | POA: Diagnosis not present

## 2016-08-22 DIAGNOSIS — F329 Major depressive disorder, single episode, unspecified: Secondary | ICD-10-CM | POA: Diagnosis not present

## 2016-08-22 DIAGNOSIS — S61401D Unspecified open wound of right hand, subsequent encounter: Secondary | ICD-10-CM | POA: Diagnosis not present

## 2016-08-22 DIAGNOSIS — I5022 Chronic systolic (congestive) heart failure: Secondary | ICD-10-CM | POA: Diagnosis not present

## 2016-08-23 DIAGNOSIS — S61401D Unspecified open wound of right hand, subsequent encounter: Secondary | ICD-10-CM | POA: Diagnosis not present

## 2016-08-23 DIAGNOSIS — I110 Hypertensive heart disease with heart failure: Secondary | ICD-10-CM | POA: Diagnosis not present

## 2016-08-23 DIAGNOSIS — E119 Type 2 diabetes mellitus without complications: Secondary | ICD-10-CM | POA: Diagnosis not present

## 2016-08-23 DIAGNOSIS — I5022 Chronic systolic (congestive) heart failure: Secondary | ICD-10-CM | POA: Diagnosis not present

## 2016-08-23 DIAGNOSIS — F329 Major depressive disorder, single episode, unspecified: Secondary | ICD-10-CM | POA: Diagnosis not present

## 2016-08-23 DIAGNOSIS — M79604 Pain in right leg: Secondary | ICD-10-CM | POA: Diagnosis not present

## 2016-08-24 DIAGNOSIS — M069 Rheumatoid arthritis, unspecified: Secondary | ICD-10-CM | POA: Diagnosis not present

## 2016-08-24 DIAGNOSIS — I5022 Chronic systolic (congestive) heart failure: Secondary | ICD-10-CM | POA: Diagnosis not present

## 2016-08-24 DIAGNOSIS — I110 Hypertensive heart disease with heart failure: Secondary | ICD-10-CM | POA: Diagnosis not present

## 2016-08-24 DIAGNOSIS — M545 Low back pain: Secondary | ICD-10-CM | POA: Diagnosis not present

## 2016-08-24 DIAGNOSIS — K52831 Collagenous colitis: Secondary | ICD-10-CM | POA: Diagnosis not present

## 2016-08-24 DIAGNOSIS — E119 Type 2 diabetes mellitus without complications: Secondary | ICD-10-CM | POA: Diagnosis not present

## 2016-08-26 DIAGNOSIS — I5022 Chronic systolic (congestive) heart failure: Secondary | ICD-10-CM | POA: Diagnosis not present

## 2016-08-26 DIAGNOSIS — E119 Type 2 diabetes mellitus without complications: Secondary | ICD-10-CM | POA: Diagnosis not present

## 2016-08-26 DIAGNOSIS — M069 Rheumatoid arthritis, unspecified: Secondary | ICD-10-CM | POA: Diagnosis not present

## 2016-08-26 DIAGNOSIS — I110 Hypertensive heart disease with heart failure: Secondary | ICD-10-CM | POA: Diagnosis not present

## 2016-08-26 DIAGNOSIS — M545 Low back pain: Secondary | ICD-10-CM | POA: Diagnosis not present

## 2016-08-26 DIAGNOSIS — K52831 Collagenous colitis: Secondary | ICD-10-CM | POA: Diagnosis not present

## 2016-08-27 ENCOUNTER — Emergency Department (HOSPITAL_COMMUNITY)
Admission: EM | Admit: 2016-08-27 | Discharge: 2016-08-27 | Disposition: A | Discharge status: Home / Self Care | Payer: Medicare Other | Attending: Emergency Medicine | Admitting: Emergency Medicine

## 2016-08-27 ENCOUNTER — Encounter (HOSPITAL_COMMUNITY): Payer: Self-pay | Admitting: *Deleted

## 2016-08-27 DIAGNOSIS — R5381 Other malaise: Secondary | ICD-10-CM | POA: Diagnosis not present

## 2016-08-27 DIAGNOSIS — T148XXA Other injury of unspecified body region, initial encounter: Secondary | ICD-10-CM | POA: Diagnosis not present

## 2016-08-27 DIAGNOSIS — Y929 Unspecified place or not applicable: Secondary | ICD-10-CM | POA: Diagnosis not present

## 2016-08-27 DIAGNOSIS — W19XXXA Unspecified fall, initial encounter: Secondary | ICD-10-CM | POA: Diagnosis not present

## 2016-08-27 DIAGNOSIS — Y9301 Activity, walking, marching and hiking: Secondary | ICD-10-CM | POA: Diagnosis not present

## 2016-08-27 DIAGNOSIS — Z791 Long term (current) use of non-steroidal anti-inflammatories (NSAID): Secondary | ICD-10-CM | POA: Insufficient documentation

## 2016-08-27 DIAGNOSIS — E119 Type 2 diabetes mellitus without complications: Secondary | ICD-10-CM | POA: Diagnosis not present

## 2016-08-27 DIAGNOSIS — Z043 Encounter for examination and observation following other accident: Secondary | ICD-10-CM | POA: Diagnosis present

## 2016-08-27 DIAGNOSIS — Z87891 Personal history of nicotine dependence: Secondary | ICD-10-CM | POA: Insufficient documentation

## 2016-08-27 DIAGNOSIS — R259 Unspecified abnormal involuntary movements: Secondary | ICD-10-CM | POA: Diagnosis not present

## 2016-08-27 DIAGNOSIS — I110 Hypertensive heart disease with heart failure: Secondary | ICD-10-CM | POA: Insufficient documentation

## 2016-08-27 DIAGNOSIS — I5022 Chronic systolic (congestive) heart failure: Secondary | ICD-10-CM | POA: Diagnosis not present

## 2016-08-27 DIAGNOSIS — Y999 Unspecified external cause status: Secondary | ICD-10-CM | POA: Insufficient documentation

## 2016-08-27 DIAGNOSIS — Z79899 Other long term (current) drug therapy: Secondary | ICD-10-CM | POA: Insufficient documentation

## 2016-08-27 DIAGNOSIS — E039 Hypothyroidism, unspecified: Secondary | ICD-10-CM | POA: Diagnosis not present

## 2016-08-27 DIAGNOSIS — Z794 Long term (current) use of insulin: Secondary | ICD-10-CM | POA: Diagnosis not present

## 2016-08-27 DIAGNOSIS — T1490XA Injury, unspecified, initial encounter: Secondary | ICD-10-CM | POA: Diagnosis not present

## 2016-08-27 DIAGNOSIS — D649 Anemia, unspecified: Secondary | ICD-10-CM | POA: Diagnosis not present

## 2016-08-27 DIAGNOSIS — R0902 Hypoxemia: Secondary | ICD-10-CM | POA: Diagnosis not present

## 2016-08-27 LAB — COMPREHENSIVE METABOLIC PANEL
ALBUMIN: 3.2 g/dL — AB (ref 3.5–5.0)
ALK PHOS: 87 U/L (ref 38–126)
ALT: 23 U/L (ref 14–54)
AST: 29 U/L (ref 15–41)
Anion gap: 9 (ref 5–15)
BILIRUBIN TOTAL: 0.7 mg/dL (ref 0.3–1.2)
BUN: 10 mg/dL (ref 6–20)
CALCIUM: 8.3 mg/dL — AB (ref 8.9–10.3)
CO2: 26 mmol/L (ref 22–32)
CREATININE: 1.16 mg/dL — AB (ref 0.44–1.00)
Chloride: 103 mmol/L (ref 101–111)
GFR calc Af Amer: 51 mL/min — ABNORMAL LOW (ref >60)
GFR calc non Af Amer: 44 mL/min — ABNORMAL LOW (ref >60)
GLUCOSE: 101 mg/dL — AB (ref 65–99)
POTASSIUM: 3.3 mmol/L — AB (ref 3.5–5.1)
Sodium: 138 mmol/L (ref 135–145)
TOTAL PROTEIN: 6.1 g/dL — AB (ref 6.5–8.1)

## 2016-08-27 LAB — CBC WITH DIFFERENTIAL/PLATELET
BASOS ABS: 0 10*3/uL (ref 0.0–0.1)
Basophils Relative: 0 %
EOS ABS: 0 10*3/uL (ref 0.0–0.7)
EOS PCT: 0 %
HCT: 29.3 % — ABNORMAL LOW (ref 36.0–46.0)
Hemoglobin: 9.3 g/dL — ABNORMAL LOW (ref 12.0–15.0)
LYMPHS PCT: 19 %
Lymphs Abs: 1.3 10*3/uL (ref 0.7–4.0)
MCH: 29.6 pg (ref 26.0–34.0)
MCHC: 31.7 g/dL (ref 30.0–36.0)
MCV: 93.3 fL (ref 78.0–100.0)
MONO ABS: 1 10*3/uL (ref 0.1–1.0)
Monocytes Relative: 15 %
Neutro Abs: 4.6 10*3/uL (ref 1.7–7.7)
Neutrophils Relative %: 66 %
PLATELETS: 165 10*3/uL (ref 150–400)
RBC: 3.14 MIL/uL — ABNORMAL LOW (ref 3.87–5.11)
RDW: 16.3 % — AB (ref 11.5–15.5)
WBC: 6.9 10*3/uL (ref 4.0–10.5)

## 2016-08-27 MED ORDER — SODIUM CHLORIDE 0.9 % IV BOLUS (SEPSIS): 1000.0000 mL | Freq: Once | INTRAVENOUS | Status: DC

## 2016-08-27 NOTE — ED Notes (Signed)
Pt admits that she had a fall on Friday night. She was brought in by EMS. She states she was walking funny. She is able to lift her hips with her legs to get undressed and rolls from side to side without difficulty.

## 2016-08-27 NOTE — ED Notes (Signed)
Dispatch called for PTAR

## 2016-08-27 NOTE — ED Provider Notes (Signed)
[facility] DEPT Provider Note   CSN: [id number] Arrival date & time: 08/27/16  1318     History   Chief Complaint Chief Complaint  Patient presents with  . Fall    HPI Olivia Mullen is a 80 y.o. female.  HPI Patient presents with concern of falls. Last fall was about 4 days ago. Today she has no new pain anywhere, denies weakness anywhere, acknowledges that she feels generally weaker than usual, but denies focal weakness. After her recent falls the patient's family members encouraged her to come here for evaluation. She denies any recent medication changes, diet changes, activity changes, though she acknowledges not eating as much since the death of her husband.    Past Medical History:  Diagnosis Date  . Anemia   . Anginal pain ([place])    long time ago  . Anxiety   . Cancer ([place])    right breast, 20 years ago  . CHF (congestive heart failure) ([place])   . Chronic systolic heart failure ([place])   . Colitis, collagenous   . Complication of anesthesia "long time ago"   one time woke up during surgery   . Depression   . Headache(784.0)    migraines  . History of kidney stones long time ago  . Hypercholesteremia    "doctor wants to get down to 100, now its 108"  . Hypertension   . Hypothyroidism (acquired) 12/16/2011  . Multinodular goiter (nontoxic) 12/16/2011  . Normochromic normocytic anemia 12/16/2011  . Pneumonia    hx of, years ago  . Psoriasis   . Rheumatoid arthritis(714.0) 12/16/2011  . UTI (lower urinary tract infection)     Patient Active Problem List   Diagnosis Date Noted  . Protein-calorie malnutrition, severe 06/29/2016  . AKI (acute kidney injury) ([place]) 06/28/2016  . Diarrhea 06/28/2016  . Acute blood loss anemia 03/11/2016  . Gastric ulcer with hemorrhage 03/11/2016  . Hyperlipidemia 02/19/2015  . Postoperative anemia due to acute blood loss 10/12/2013  . Osteoarthritis of right hip 08/31/2013  . Depression   . Chronic systolic heart failure  ([place])   . Hypertension 03/08/2012  . Breast cancer, stage 2 ([place]) 12/16/2011  . Rheumatoid arthritis(714.0) 12/16/2011  . Multinodular goiter (nontoxic) 12/16/2011  . Hypothyroidism (acquired) 12/16/2011  . DM type 2 (diabetes mellitus, type 2) ([place]) 12/16/2011  . Normochromic normocytic anemia 12/16/2011    Past Surgical History:  Procedure Laterality Date  . ABDOMINAL HYSTERECTOMY    . APPENDECTOMY    . BREAST SURGERY  20 years ago   reduced left breast, reconstruction  . CARPAL TUNNEL RELEASE Right   . CATARACT EXTRACTION Bilateral   . CHOLECYSTECTOMY    . ESOPHAGOGASTRODUODENOSCOPY (EGD) WITH PROPOFOL N/A 03/12/2016   Procedure: ESOPHAGOGASTRODUODENOSCOPY (EGD) WITH PROPOFOL;  Surgeon: [name], MD;  Location: [facility] ENDOSCOPY;  Service: Endoscopy;  Laterality: N/A;  . left shoulder     "replaced joint with rod"  . MASTECTOMY     right  . SKIN GRAFT Right    , Calf  . THYROID SURGERY    . TONSILLECTOMY    . TOTAL HIP ARTHROPLASTY Right 10/11/2013   Procedure: RIGHT TOTAL HIP ARTHROPLASTY ANTERIOR APPROACH;  Surgeon: Gearlean Alf, MD;  Location: [facility] ORS;  Service: Orthopedics;  Laterality: Right;    OB History    No data available       Home Medications    Prior to Admission medications   Medication Sig Start Date End Date Taking? Authorizing Provider  acetaminophen (  TYLENOL) 500 MG tablet Take 1,000 mg by mouth every 6 (six) hours as needed for moderate pain.    Historical Provider, MD  budesonide (ENTOCORT EC) 3 MG 24 hr capsule Take 3 capsules (9 mg total) by mouth daily. 06/30/16   Mir Marry Guan, MD  butalbital-acetaminophen-caffeine (FIORICET, ESGIC) [phone] MG tablet Take 1 tablet by mouth 2 (two) times daily as needed for headache.    Historical Provider, MD  carvedilol (COREG) 6.25 MG tablet TAKE 1 TABLET (6.25 MG TOTAL) BY MOUTH 2 (TWO) TIMES DAILY WITH A MEAL. 03/08/16   Belva Crome, MD  cholecalciferol (VITAMIN D) 1000 UNITS tablet Take  2,000 Units by mouth daily.     Historical Provider, MD  clonazePAM (KLONOPIN) 0.5 MG tablet Take 0.5 mg by mouth 2 (two) times daily.     Historical Provider, MD  doxepin (SINEQUAN) 50 MG capsule Take 50 mg by mouth at bedtime.    Historical Provider, MD  feeding supplement, ENSURE ENLIVE, (ENSURE ENLIVE) LIQD Take 237 mLs by mouth 2 (two) times daily between meals. 06/30/16   Mir Marry Guan, MD  folic acid (FOLVITE) 1 MG tablet Take 1 mg by mouth daily.  01/17/14   Historical Provider, MD  glycopyrrolate (ROBINUL) 2 MG tablet Take 2 mg by mouth 2 (two) times daily. 02/19/16   Historical Provider, MD  HUMIRA PEN 40 MG/0.8ML injection 1 injection every other week 01/22/14   Historical Provider, MD  leflunomide (ARAVA) 20 MG tablet Take 20 mg by mouth daily.    Historical Provider, MD  levothyroxine (SYNTHROID, LEVOTHROID) 75 MCG tablet Take 75 mcg by mouth daily before breakfast.    Historical Provider, MD  loperamide (IMODIUM) 2 MG capsule Take 1 capsule (2 mg total) by mouth as needed for diarrhea or loose stools. 06/30/16   Mir Marry Guan, MD  Multiple Vitamins-Minerals (ADULT GUMMY PO) Take 2 tablets by mouth daily.    Historical Provider, MD  pantoprazole (PROTONIX) 40 MG tablet Take 1 tablet (40 mg total) by mouth 2 (two) times daily. Patient taking differently: Take 40 mg by mouth daily.  03/16/16   Charlynne Cousins, MD  Probiotic Product (PROBIOTIC DAILY PO) Take 2 tablets by mouth daily.     Historical Provider, MD  sertraline (ZOLOFT) 50 MG tablet Take 50 mg by mouth at bedtime. 06/11/16   Historical Provider, MD  simvastatin (ZOCOR) 10 MG tablet Take 10 mg by mouth daily.    Historical Provider, MD    Family History Family History  Problem Relation Age of Onset  . Heart disease Mother   . Healthy Sister     Social History Social History  Substance Use Topics  . Smoking status: Former Smoker    Packs/day: 3.00    Types: Cigarettes    Quit date: 10/21/1992  .  Smokeless tobacco: Never Used  . Alcohol use Yes     Comment: occasional     Allergies   Gabapentin; Isosorbide nitrate; Wellbutrin [bupropion]; and Zonisamide   Review of Systems Review of Systems  Constitutional:       Per HPI, otherwise negative  HENT:       Per HPI, otherwise negative  Respiratory:       Per HPI, otherwise negative  Cardiovascular:       Per HPI, otherwise negative  Gastrointestinal: Negative for vomiting.  Endocrine:       Negative aside from HPI  Genitourinary:       Neg aside from HPI  Musculoskeletal:       Per HPI, otherwise negative  Skin: Negative.  Negative for wound.  Neurological: Negative for syncope.  Hematological: Bruises/bleeds easily.     Physical Exam Updated Vital Signs BP 137/58 (BP Location: Left Arm)   Pulse 79   Temp 98.5 F (36.9 C) (Oral)   Resp 18   SpO2 95%   Physical Exam  Constitutional: She is oriented to person, place, and time. She appears well-developed and well-nourished. No distress.  HENT:  Head: Normocephalic and atraumatic.  Eyes: Conjunctivae and EOM are normal.  Neck: Neck supple. No tracheal deviation present.  Cardiovascular: Normal rate and regular rhythm.   Pulmonary/Chest: Effort normal and breath sounds normal. No stridor. No respiratory distress.  Abdominal: She exhibits no distension.  Musculoskeletal: She exhibits no edema.  No hip tenderness to palpation, no difficulty with hip flexion bilaterally, legs are symmetric, strength is appropriate in all 4 extremities  Neurological: She is alert and oriented to person, place, and time. No cranial nerve deficit.  Skin: Skin is warm and dry.  Psychiatric: She has a normal mood and affect.  Nursing note and vitals reviewed.    ED Treatments / Results  Labs (all labs ordered are listed, but only abnormal results are displayed) Labs Reviewed  COMPREHENSIVE METABOLIC PANEL - Abnormal; Notable for the following:       Result Value   Potassium  3.3 (*)    Glucose, Bld 101 (*)    Creatinine, Ser 1.16 (*)    Calcium 8.3 (*)    Total Protein 6.1 (*)    Albumin 3.2 (*)    GFR calc non Af Amer 44 (*)    GFR calc Af Amer 51 (*)    All other components within normal limits  CBC WITH DIFFERENTIAL/PLATELET - Abnormal; Notable for the following:    RBC 3.14 (*)    Hemoglobin 9.3 (*)    HCT 29.3 (*)    RDW 16.3 (*)    All other components within normal limits  URINALYSIS, ROUTINE W REFLEX MICROSCOPIC (NOT AT [facility])    EKG  EKG Interpretation  Date/Time:  Tuesday August 27 2016 13:53:00 EST Ventricular Rate:  76 PR Interval:    QRS Duration: 127 QT Interval:  428 QTC Calculation: 482 R Axis:   -57 Text Interpretation:  Sinus rhythm Prolonged PR interval Left bundle branch block Baseline wander T wave abnormality Abnormal ekg Confirmed by Carmin Muskrat  MD [phone]) on 08/27/2016 2:14:21 PM        Procedures Procedures (including critical care time)  Medications Ordered in ED Medications  sodium chloride 0.9 % bolus 1,000 mL (not administered)     Initial Impression / Assessment and Plan / ED Course  I have reviewed the triage vital signs and the nursing notes.  Pertinent labs & imaging results that were available during my care of the patient were reviewed by me and considered in my medical decision making (see chart for details).  Clinical Course     On repeat exam the patient is in no distress. She is awake, alert, speaking clearly. I discussed all findings, including reassuring lab results. Patient has a mild anemia, though no evidence for active hemorrhage. Patient will follow up with primary care.   Final Clinical Impressions(s) / ED Diagnoses  Elderly female presents with ongoing falls. However, here the patient is awake and alert, no pain anywhere. Patient is neurologically intact throughout, and without substantial complaints, and with reassuring labs aside from  mild anemia, the patient was  appropriate for discharge with outpatient evaluation, management.    Carmin Muskrat, MD 08/27/16 1530

## 2016-08-27 NOTE — ED Notes (Signed)
PTAR left with pt 

## 2016-08-27 NOTE — Progress Notes (Signed)
CSW attempted to speak with patient at bedside. Nurse was in room at the time.  Genice Rouge [id number] ED CSW 08/27/2016 2:08 PM

## 2016-08-27 NOTE — ED Notes (Signed)
PTAR got vitals and left with pt. No distress noted.

## 2016-08-27 NOTE — Discharge Instructions (Signed)
As discussed, your evaluation today has been largely reassuring.  But, it is important that you monitor your condition carefully, and do not hesitate to return to the ED if you develop new, or concerning changes in your condition. ? ?Otherwise, please follow-up with your physician for appropriate ongoing care. ? ?

## 2016-08-27 NOTE — ED Triage Notes (Signed)
Pt BIB EMS after falling from home. She has deformities from previous falls. Has arthritis and states her legs just got weak and she fell. EMS reports that son states she is not eating since husband passed away.

## 2016-08-29 DIAGNOSIS — I5022 Chronic systolic (congestive) heart failure: Secondary | ICD-10-CM | POA: Diagnosis not present

## 2016-08-29 DIAGNOSIS — E119 Type 2 diabetes mellitus without complications: Secondary | ICD-10-CM | POA: Diagnosis not present

## 2016-08-29 DIAGNOSIS — M545 Low back pain: Secondary | ICD-10-CM | POA: Diagnosis not present

## 2016-08-29 DIAGNOSIS — M069 Rheumatoid arthritis, unspecified: Secondary | ICD-10-CM | POA: Diagnosis not present

## 2016-08-29 DIAGNOSIS — K52831 Collagenous colitis: Secondary | ICD-10-CM | POA: Diagnosis not present

## 2016-08-29 DIAGNOSIS — I110 Hypertensive heart disease with heart failure: Secondary | ICD-10-CM | POA: Diagnosis not present

## 2016-09-02 DIAGNOSIS — I5022 Chronic systolic (congestive) heart failure: Secondary | ICD-10-CM | POA: Diagnosis not present

## 2016-09-02 DIAGNOSIS — I110 Hypertensive heart disease with heart failure: Secondary | ICD-10-CM | POA: Diagnosis not present

## 2016-09-02 DIAGNOSIS — M069 Rheumatoid arthritis, unspecified: Secondary | ICD-10-CM | POA: Diagnosis not present

## 2016-09-02 DIAGNOSIS — K52831 Collagenous colitis: Secondary | ICD-10-CM | POA: Diagnosis not present

## 2016-09-02 DIAGNOSIS — M545 Low back pain: Secondary | ICD-10-CM | POA: Diagnosis not present

## 2016-09-02 DIAGNOSIS — E119 Type 2 diabetes mellitus without complications: Secondary | ICD-10-CM | POA: Diagnosis not present

## 2016-09-03 DIAGNOSIS — I5022 Chronic systolic (congestive) heart failure: Secondary | ICD-10-CM | POA: Diagnosis not present

## 2016-09-03 DIAGNOSIS — M069 Rheumatoid arthritis, unspecified: Secondary | ICD-10-CM | POA: Diagnosis not present

## 2016-09-03 DIAGNOSIS — M545 Low back pain: Secondary | ICD-10-CM | POA: Diagnosis not present

## 2016-09-03 DIAGNOSIS — E119 Type 2 diabetes mellitus without complications: Secondary | ICD-10-CM | POA: Diagnosis not present

## 2016-09-03 DIAGNOSIS — I110 Hypertensive heart disease with heart failure: Secondary | ICD-10-CM | POA: Diagnosis not present

## 2016-09-03 DIAGNOSIS — K52831 Collagenous colitis: Secondary | ICD-10-CM | POA: Diagnosis not present

## 2016-09-04 DIAGNOSIS — I5022 Chronic systolic (congestive) heart failure: Secondary | ICD-10-CM | POA: Diagnosis not present

## 2016-09-04 DIAGNOSIS — E119 Type 2 diabetes mellitus without complications: Secondary | ICD-10-CM | POA: Diagnosis not present

## 2016-09-04 DIAGNOSIS — M069 Rheumatoid arthritis, unspecified: Secondary | ICD-10-CM | POA: Diagnosis not present

## 2016-09-04 DIAGNOSIS — M545 Low back pain: Secondary | ICD-10-CM | POA: Diagnosis not present

## 2016-09-04 DIAGNOSIS — I110 Hypertensive heart disease with heart failure: Secondary | ICD-10-CM | POA: Diagnosis not present

## 2016-09-04 DIAGNOSIS — K52831 Collagenous colitis: Secondary | ICD-10-CM | POA: Diagnosis not present

## 2016-09-05 DIAGNOSIS — K52831 Collagenous colitis: Secondary | ICD-10-CM | POA: Diagnosis not present

## 2016-09-05 DIAGNOSIS — E119 Type 2 diabetes mellitus without complications: Secondary | ICD-10-CM | POA: Diagnosis not present

## 2016-09-05 DIAGNOSIS — I5022 Chronic systolic (congestive) heart failure: Secondary | ICD-10-CM | POA: Diagnosis not present

## 2016-09-05 DIAGNOSIS — I110 Hypertensive heart disease with heart failure: Secondary | ICD-10-CM | POA: Diagnosis not present

## 2016-09-05 DIAGNOSIS — M069 Rheumatoid arthritis, unspecified: Secondary | ICD-10-CM | POA: Diagnosis not present

## 2016-09-05 DIAGNOSIS — M545 Low back pain: Secondary | ICD-10-CM | POA: Diagnosis not present

## 2016-09-06 DIAGNOSIS — I5022 Chronic systolic (congestive) heart failure: Secondary | ICD-10-CM | POA: Diagnosis not present

## 2016-09-06 DIAGNOSIS — K52831 Collagenous colitis: Secondary | ICD-10-CM | POA: Diagnosis not present

## 2016-09-06 DIAGNOSIS — M069 Rheumatoid arthritis, unspecified: Secondary | ICD-10-CM | POA: Diagnosis not present

## 2016-09-06 DIAGNOSIS — M545 Low back pain: Secondary | ICD-10-CM | POA: Diagnosis not present

## 2016-09-06 DIAGNOSIS — E119 Type 2 diabetes mellitus without complications: Secondary | ICD-10-CM | POA: Diagnosis not present

## 2016-09-06 DIAGNOSIS — I110 Hypertensive heart disease with heart failure: Secondary | ICD-10-CM | POA: Diagnosis not present

## 2016-09-09 DIAGNOSIS — I5022 Chronic systolic (congestive) heart failure: Secondary | ICD-10-CM | POA: Diagnosis not present

## 2016-09-09 DIAGNOSIS — E119 Type 2 diabetes mellitus without complications: Secondary | ICD-10-CM | POA: Diagnosis not present

## 2016-09-09 DIAGNOSIS — M069 Rheumatoid arthritis, unspecified: Secondary | ICD-10-CM | POA: Diagnosis not present

## 2016-09-09 DIAGNOSIS — K52831 Collagenous colitis: Secondary | ICD-10-CM | POA: Diagnosis not present

## 2016-09-09 DIAGNOSIS — I110 Hypertensive heart disease with heart failure: Secondary | ICD-10-CM | POA: Diagnosis not present

## 2016-09-09 DIAGNOSIS — M545 Low back pain: Secondary | ICD-10-CM | POA: Diagnosis not present

## 2016-09-11 ENCOUNTER — Other Ambulatory Visit: Payer: Self-pay

## 2016-09-11 DIAGNOSIS — E119 Type 2 diabetes mellitus without complications: Secondary | ICD-10-CM | POA: Diagnosis not present

## 2016-09-11 DIAGNOSIS — K52831 Collagenous colitis: Secondary | ICD-10-CM | POA: Diagnosis not present

## 2016-09-11 DIAGNOSIS — M545 Low back pain: Secondary | ICD-10-CM | POA: Diagnosis not present

## 2016-09-11 DIAGNOSIS — M069 Rheumatoid arthritis, unspecified: Secondary | ICD-10-CM | POA: Diagnosis not present

## 2016-09-11 DIAGNOSIS — I110 Hypertensive heart disease with heart failure: Secondary | ICD-10-CM | POA: Diagnosis not present

## 2016-09-11 DIAGNOSIS — I5022 Chronic systolic (congestive) heart failure: Secondary | ICD-10-CM | POA: Diagnosis not present

## 2016-09-11 NOTE — Patient Outreach (Signed)
[facility]) Care Management  09/11/2016  Olivia Mullen May 03, 1936 [id number]  Referral received 09/06/16  Referral source: [facility] Referral reason: limited support system, history of falls, high risk for rehospitalization  Telephone call to patient regarding referral. Unable to reach.  HIPAA compliant voice message left with call back phone [phone].    PLAN; Rncm will attempt 2nd telephone call to patient within 1 week.   Quinn Plowman RN,BSN,CCM [facility] Telephonic  [phone]

## 2016-09-13 ENCOUNTER — Encounter (HOSPITAL_COMMUNITY): Payer: Self-pay | Admitting: Emergency Medicine

## 2016-09-13 ENCOUNTER — Emergency Department (HOSPITAL_COMMUNITY)
Admission: EM | Admit: 2016-09-13 | Discharge: 2016-09-16 | Disposition: A | Discharge status: Home / Self Care | Payer: Medicare Other | Attending: Emergency Medicine | Admitting: Emergency Medicine

## 2016-09-13 ENCOUNTER — Emergency Department (HOSPITAL_COMMUNITY): Payer: Medicare Other

## 2016-09-13 DIAGNOSIS — Z853 Personal history of malignant neoplasm of breast: Secondary | ICD-10-CM | POA: Diagnosis not present

## 2016-09-13 DIAGNOSIS — Z87891 Personal history of nicotine dependence: Secondary | ICD-10-CM | POA: Diagnosis not present

## 2016-09-13 DIAGNOSIS — Y929 Unspecified place or not applicable: Secondary | ICD-10-CM | POA: Diagnosis not present

## 2016-09-13 DIAGNOSIS — R404 Transient alteration of awareness: Secondary | ICD-10-CM | POA: Diagnosis not present

## 2016-09-13 DIAGNOSIS — I5022 Chronic systolic (congestive) heart failure: Secondary | ICD-10-CM | POA: Insufficient documentation

## 2016-09-13 DIAGNOSIS — W06XXXA Fall from bed, initial encounter: Secondary | ICD-10-CM | POA: Insufficient documentation

## 2016-09-13 DIAGNOSIS — Y999 Unspecified external cause status: Secondary | ICD-10-CM | POA: Insufficient documentation

## 2016-09-13 DIAGNOSIS — S199XXA Unspecified injury of neck, initial encounter: Secondary | ICD-10-CM | POA: Diagnosis not present

## 2016-09-13 DIAGNOSIS — N3000 Acute cystitis without hematuria: Secondary | ICD-10-CM | POA: Diagnosis not present

## 2016-09-13 DIAGNOSIS — I110 Hypertensive heart disease with heart failure: Secondary | ICD-10-CM | POA: Diagnosis not present

## 2016-09-13 DIAGNOSIS — R0989 Other specified symptoms and signs involving the circulatory and respiratory systems: Secondary | ICD-10-CM | POA: Diagnosis not present

## 2016-09-13 DIAGNOSIS — R51 Headache: Secondary | ICD-10-CM | POA: Diagnosis not present

## 2016-09-13 DIAGNOSIS — E119 Type 2 diabetes mellitus without complications: Secondary | ICD-10-CM | POA: Insufficient documentation

## 2016-09-13 DIAGNOSIS — M542 Cervicalgia: Secondary | ICD-10-CM | POA: Diagnosis not present

## 2016-09-13 DIAGNOSIS — E039 Hypothyroidism, unspecified: Secondary | ICD-10-CM | POA: Diagnosis not present

## 2016-09-13 DIAGNOSIS — Z96641 Presence of right artificial hip joint: Secondary | ICD-10-CM | POA: Insufficient documentation

## 2016-09-13 DIAGNOSIS — R791 Abnormal coagulation profile: Secondary | ICD-10-CM | POA: Diagnosis not present

## 2016-09-13 DIAGNOSIS — R531 Weakness: Secondary | ICD-10-CM | POA: Diagnosis not present

## 2016-09-13 DIAGNOSIS — W19XXXA Unspecified fall, initial encounter: Secondary | ICD-10-CM

## 2016-09-13 DIAGNOSIS — Y939 Activity, unspecified: Secondary | ICD-10-CM | POA: Insufficient documentation

## 2016-09-13 DIAGNOSIS — S299XXA Unspecified injury of thorax, initial encounter: Secondary | ICD-10-CM | POA: Diagnosis not present

## 2016-09-13 DIAGNOSIS — Z79899 Other long term (current) drug therapy: Secondary | ICD-10-CM | POA: Insufficient documentation

## 2016-09-13 DIAGNOSIS — S0990XA Unspecified injury of head, initial encounter: Secondary | ICD-10-CM | POA: Diagnosis not present

## 2016-09-13 DIAGNOSIS — R0602 Shortness of breath: Secondary | ICD-10-CM

## 2016-09-13 LAB — COMPREHENSIVE METABOLIC PANEL
ALBUMIN: 3.4 g/dL — AB (ref 3.5–5.0)
ALT: 13 U/L — ABNORMAL LOW (ref 14–54)
ANION GAP: 12 (ref 5–15)
AST: 24 U/L (ref 15–41)
Alkaline Phosphatase: 98 U/L (ref 38–126)
BILIRUBIN TOTAL: 0.7 mg/dL (ref 0.3–1.2)
BUN: 13 mg/dL (ref 6–20)
CO2: 22 mmol/L (ref 22–32)
Calcium: 8.4 mg/dL — ABNORMAL LOW (ref 8.9–10.3)
Chloride: 103 mmol/L (ref 101–111)
Creatinine, Ser: 1.11 mg/dL — ABNORMAL HIGH (ref 0.44–1.00)
GFR, EST AFRICAN AMERICAN: 54 mL/min — AB (ref >60)
GFR, EST NON AFRICAN AMERICAN: 46 mL/min — AB (ref >60)
GLUCOSE: 122 mg/dL — AB (ref 65–99)
POTASSIUM: 3.5 mmol/L (ref 3.5–5.1)
Sodium: 137 mmol/L (ref 135–145)
TOTAL PROTEIN: 6.5 g/dL (ref 6.5–8.1)

## 2016-09-13 LAB — URINALYSIS, ROUTINE W REFLEX MICROSCOPIC
BILIRUBIN URINE: NEGATIVE
Glucose, UA: NEGATIVE mg/dL
HGB URINE DIPSTICK: NEGATIVE
KETONES UR: NEGATIVE mg/dL
NITRITE: POSITIVE — AB
PH: 5 (ref 5.0–8.0)
Protein, ur: NEGATIVE mg/dL
Specific Gravity, Urine: 1.008 (ref 1.005–1.030)

## 2016-09-13 LAB — URINE MICROSCOPIC-ADD ON: RBC / HPF: NONE SEEN RBC/hpf (ref 0–5)

## 2016-09-13 LAB — CBC WITH DIFFERENTIAL/PLATELET
BASOS ABS: 0 10*3/uL (ref 0.0–0.1)
Basophils Relative: 1 %
Eosinophils Absolute: 0 10*3/uL (ref 0.0–0.7)
Eosinophils Relative: 0 %
HEMATOCRIT: 31.7 % — AB (ref 36.0–46.0)
Hemoglobin: 10.1 g/dL — ABNORMAL LOW (ref 12.0–15.0)
LYMPHS PCT: 14 %
Lymphs Abs: 1.1 10*3/uL (ref 0.7–4.0)
MCH: 29.1 pg (ref 26.0–34.0)
MCHC: 31.9 g/dL (ref 30.0–36.0)
MCV: 91.4 fL (ref 78.0–100.0)
Monocytes Absolute: 0.3 10*3/uL (ref 0.1–1.0)
Monocytes Relative: 4 %
NEUTROS ABS: 6.6 10*3/uL (ref 1.7–7.7)
NEUTROS PCT: 81 %
Platelets: 299 10*3/uL (ref 150–400)
RBC: 3.47 MIL/uL — AB (ref 3.87–5.11)
RDW: 16.7 % — ABNORMAL HIGH (ref 11.5–15.5)
WBC: 8 10*3/uL (ref 4.0–10.5)

## 2016-09-13 LAB — I-STAT TROPONIN, ED: TROPONIN I, POC: 0.02 ng/mL (ref 0.00–0.08)

## 2016-09-13 LAB — D-DIMER, QUANTITATIVE: D-Dimer, Quant: 4.26 ug/mL-FEU — ABNORMAL HIGH (ref 0.00–0.50)

## 2016-09-13 LAB — BRAIN NATRIURETIC PEPTIDE: B NATRIURETIC PEPTIDE 5: 81.7 pg/mL (ref 0.0–100.0)

## 2016-09-13 LAB — LIPASE, BLOOD: Lipase: 26 U/L (ref 11–51)

## 2016-09-13 MED ORDER — IOPAMIDOL (ISOVUE-300) INJECTION 61%
INTRAVENOUS | Status: AC
  Administered 2016-09-13: 21:00:00
  Filled 2016-09-13: qty 100

## 2016-09-13 MED ORDER — CEFTRIAXONE SODIUM 1 G IJ SOLR
1.0000 g | Freq: Once | INTRAMUSCULAR | Status: AC
  Administered 2016-09-13: 1 g via INTRAVENOUS
  Filled 2016-09-13: qty 10

## 2016-09-13 MED ORDER — SODIUM CHLORIDE 0.9 % IJ SOLN
INTRAMUSCULAR | Status: AC
  Administered 2016-09-13: 21:00:00
  Filled 2016-09-13: qty 50

## 2016-09-13 MED ORDER — IOPAMIDOL (ISOVUE-370) INJECTION 76%
100.0000 mL | Freq: Once | INTRAVENOUS | Status: AC | PRN
  Administered 2016-09-13: 100 mL via INTRAVENOUS

## 2016-09-13 NOTE — ED Notes (Signed)
Bed: [id number] Expected date: 09/13/16 Expected time: 5:08 PM Means of arrival: Ambulance Comments: Weak, had to break in home to get to pt, ?fall

## 2016-09-13 NOTE — ED Triage Notes (Signed)
Patient arrived via EMS after falling out of bed today.  Patient was talking to a friend and the friend became concerned and called EMS.  Upon arrival patient was rolling around on the floor and pulling herself back on the bed.  Patient is alert and oriented, verbal with no c/o pain.  Patient's family is concerned because she has gradually become unable to care for herself at home and family has been trying to get her to go into an assisted living situation.

## 2016-09-13 NOTE — ED Provider Notes (Signed)
[facility] DEPT Provider Note   CSN: [id number] Arrival date & time: 09/13/16  1717     History   Chief Complaint Chief Complaint  Patient presents with  . Fall    HPI Olivia Mullen is a 80 y.o. female.  80 year old Caucasian female with a past medical history significant for CHF (last EF 50-55% 02/2015), hypertension, hypothyroidism, arthritis, colitis that presents to the ED today after a fall. Patient states that she was in her bed this afternoon and reached to get the TV changer and fell out of bed. Patient states that she hit her head. She denies LOC. She denies headache or neck pain at this time. At the time patient was talking to a friend and the friend was concerned and called EMS. When EMS arrived patient was on the floor rolling around trying to pull herself back on the bed. Patient states that she has been feeling weak for the past 3 days. She states that she just does not have any energy to do anything. In report the nurse was told that the family was concerned about the patient. She has gradually become more unable to care for herself at home and they've been trying to get her into an assisted living. Patient states that she has been short of breath for the past few days. She denies any chest pain. She denies any hemoptysis. States she does not wear oxygen at home.. She also states that she has had diarrhea over the past week which is not abnormal for her given that she has colitis. She denies any hematochezia or melena. She denies any fever, chills, headache, vision changes, lightheadedness, dizziness, cough, chest pain, abd pain, nausea, emesis, urinary symptoms, numbness/tingling.      Past Medical History:  Diagnosis Date  . Anemia   . Anginal pain ([place])    long time ago  . Anxiety   . Cancer ([place])    right breast, 20 years ago  . CHF (congestive heart failure) ([place])   . Chronic systolic heart failure ([place])   . Colitis, collagenous   . Complication of  anesthesia "long time ago"   one time woke up during surgery   . Depression   . Headache(784.0)    migraines  . History of kidney stones long time ago  . Hypercholesteremia    "doctor wants to get down to 100, now its 108"  . Hypertension   . Hypothyroidism (acquired) 12/16/2011  . Multinodular goiter (nontoxic) 12/16/2011  . Normochromic normocytic anemia 12/16/2011  . Pneumonia    hx of, years ago  . Psoriasis   . Rheumatoid arthritis(714.0) 12/16/2011  . UTI (lower urinary tract infection)     Patient Active Problem List   Diagnosis Date Noted  . Protein-calorie malnutrition, severe 06/29/2016  . AKI (acute kidney injury) ([place]) 06/28/2016  . Diarrhea 06/28/2016  . Acute blood loss anemia 03/11/2016  . Gastric ulcer with hemorrhage 03/11/2016  . Hyperlipidemia 02/19/2015  . Postoperative anemia due to acute blood loss 10/12/2013  . Osteoarthritis of right hip 08/31/2013  . Depression   . Chronic systolic heart failure ([place])   . Hypertension 03/08/2012  . Breast cancer, stage 2 ([place]) 12/16/2011  . Rheumatoid arthritis(714.0) 12/16/2011  . Multinodular goiter (nontoxic) 12/16/2011  . Hypothyroidism (acquired) 12/16/2011  . DM type 2 (diabetes mellitus, type 2) ([place]) 12/16/2011  . Normochromic normocytic anemia 12/16/2011    Past Surgical History:  Procedure Laterality Date  . ABDOMINAL HYSTERECTOMY    . APPENDECTOMY    .  BREAST SURGERY  20 years ago   reduced left breast, reconstruction  . CARPAL TUNNEL RELEASE Right   . CATARACT EXTRACTION Bilateral   . CHOLECYSTECTOMY    . ESOPHAGOGASTRODUODENOSCOPY (EGD) WITH PROPOFOL N/A 03/12/2016   Procedure: ESOPHAGOGASTRODUODENOSCOPY (EGD) WITH PROPOFOL;  Surgeon: [name], MD;  Location: [facility] ENDOSCOPY;  Service: Endoscopy;  Laterality: N/A;  . left shoulder     "replaced joint with rod"  . MASTECTOMY     right  . SKIN GRAFT Right    , Calf  . THYROID SURGERY    . TONSILLECTOMY    . TOTAL HIP ARTHROPLASTY Right  10/11/2013   Procedure: RIGHT TOTAL HIP ARTHROPLASTY ANTERIOR APPROACH;  Surgeon: Gearlean Alf, MD;  Location: [facility] ORS;  Service: Orthopedics;  Laterality: Right;    OB History    No data available       Home Medications    Prior to Admission medications   Medication Sig Start Date End Date Taking? Authorizing Provider  acetaminophen (TYLENOL) 500 MG tablet Take 1,000 mg by mouth every 6 (six) hours as needed for moderate pain.    Historical Provider, MD  budesonide (ENTOCORT EC) 3 MG 24 hr capsule Take 3 capsules (9 mg total) by mouth daily. 06/30/16   Mir Marry Guan, MD  butalbital-acetaminophen-caffeine (FIORICET, ESGIC) [phone] MG tablet Take 1 tablet by mouth 2 (two) times daily as needed for headache.    Historical Provider, MD  carvedilol (COREG) 6.25 MG tablet TAKE 1 TABLET (6.25 MG TOTAL) BY MOUTH 2 (TWO) TIMES DAILY WITH A MEAL. 03/08/16   Belva Crome, MD  cholecalciferol (VITAMIN D) 1000 UNITS tablet Take 2,000 Units by mouth daily.     Historical Provider, MD  clonazePAM (KLONOPIN) 0.5 MG tablet Take 0.5 mg by mouth 2 (two) times daily.     Historical Provider, MD  doxepin (SINEQUAN) 50 MG capsule Take 50 mg by mouth at bedtime.    Historical Provider, MD  feeding supplement, ENSURE ENLIVE, (ENSURE ENLIVE) LIQD Take 237 mLs by mouth 2 (two) times daily between meals. 06/30/16   Mir Marry Guan, MD  folic acid (FOLVITE) 1 MG tablet Take 1 mg by mouth daily.  01/17/14   Historical Provider, MD  glycopyrrolate (ROBINUL) 2 MG tablet Take 2 mg by mouth 2 (two) times daily. 02/19/16   Historical Provider, MD  HUMIRA PEN 40 MG/0.8ML injection 1 injection every other week 01/22/14   Historical Provider, MD  leflunomide (ARAVA) 20 MG tablet Take 20 mg by mouth daily.    Historical Provider, MD  levothyroxine (SYNTHROID, LEVOTHROID) 75 MCG tablet Take 75 mcg by mouth daily before breakfast.    Historical Provider, MD  loperamide (IMODIUM) 2 MG capsule Take 1 capsule (2 mg  total) by mouth as needed for diarrhea or loose stools. 06/30/16   Mir Marry Guan, MD  Multiple Vitamins-Minerals (ADULT GUMMY PO) Take 2 tablets by mouth daily.    Historical Provider, MD  pantoprazole (PROTONIX) 40 MG tablet Take 1 tablet (40 mg total) by mouth 2 (two) times daily. Patient taking differently: Take 40 mg by mouth daily.  03/16/16   Charlynne Cousins, MD  Probiotic Product (PROBIOTIC DAILY PO) Take 2 tablets by mouth daily.     Historical Provider, MD  sertraline (ZOLOFT) 50 MG tablet Take 50 mg by mouth at bedtime. 06/11/16   Historical Provider, MD  simvastatin (ZOCOR) 10 MG tablet Take 10 mg by mouth daily.    Historical Provider, MD  Family History Family History  Problem Relation Age of Onset  . Heart disease Mother   . Healthy Sister     Social History Social History  Substance Use Topics  . Smoking status: Former Smoker    Packs/day: 3.00    Types: Cigarettes    Quit date: 10/21/1992  . Smokeless tobacco: Never Used  . Alcohol use Yes     Comment: occasional     Allergies   Gabapentin; Isosorbide nitrate; Wellbutrin [bupropion]; and Zonisamide   Review of Systems Review of Systems  Constitutional: Positive for fatigue. Negative for chills and fever.  HENT: Negative for congestion, rhinorrhea and sore throat.   Eyes: Negative for photophobia and visual disturbance.  Respiratory: Negative for cough, shortness of breath and wheezing.   Cardiovascular: Negative for chest pain, palpitations and leg swelling.  Gastrointestinal: Positive for diarrhea. Negative for abdominal pain, nausea and vomiting.  Genitourinary: Negative for dysuria, flank pain, frequency and urgency.  Musculoskeletal: Negative for back pain, neck pain and neck stiffness.  Skin: Negative.   Neurological: Negative for dizziness, syncope, weakness, light-headedness and numbness.  All other systems reviewed and are negative.    Physical Exam Updated Vital Signs BP 133/58  (BP Location: Left Arm)   Pulse 73   Temp 97.5 F (36.4 C) (Oral)   Resp 16   SpO2 (!) 89%   Physical Exam  Constitutional: She is oriented to person, place, and time. She appears well-developed and well-nourished. No distress.  Patient appears disheveled and weak appearing. She appears pale and dehydrated.  HENT:  Head: Normocephalic and atraumatic.  Right Ear: Tympanic membrane, external ear and ear canal normal.  Left Ear: Tympanic membrane, external ear and ear canal normal.  Nose: Nose normal.  Mouth/Throat: Uvula is midline, oropharynx is clear and moist and mucous membranes are normal.  Eyes: Conjunctivae and EOM are normal. Pupils are equal, round, and reactive to light. Right eye exhibits no discharge. Left eye exhibits no discharge. No scleral icterus.  Neck: Normal range of motion. Neck supple. No thyromegaly present.  No midline C-spine tenderness. Full range of motion.  Cardiovascular: Normal rate, regular rhythm, normal heart sounds and intact distal pulses.  Exam reveals no gallop and no friction rub.   No murmur heard. Pulmonary/Chest: Effort normal and breath sounds normal. She has no wheezes.  Patient noted to be hypoxic at 89%. She was put on 2 L and improved to 95%. Patient does not appear to. She is not in respiratory distress. Lungs clear to ascultation bilatearaly  Abdominal: Soft. Bowel sounds are normal. She exhibits no distension. There is no tenderness. There is no rebound and no guarding.  No guarding, rebound, distention, tenderness to palpation.  Musculoskeletal: Normal range of motion.  Pelvis is stable and patient is moving all 4 extremities without any difficulty. She denies any pain at this time. No L-spine or T-spine midline tenderness. No lower extremity edema. No calf tenderness. DP pulses are 2+ bilaterally. Radial pulses are 2+ bilaterally.  Patient is moving all 4 extremities without any difficulties. She is able to ambulate in the hallway with a  walker.  Lymphadenopathy:    She has no cervical adenopathy.  Neurological: She is alert and oriented to person, place, and time. GCS eye subscore is 4. GCS verbal subscore is 5. GCS motor subscore is 6.  The patient is alert, attentive, and oriented x 3. Speech is clear. Cranial nerve II-VII grossly intact. Negative pronator drift. Sensation intact. Strength 5/5 in all  extremities. Reflexes 2+ and symmetric at biceps, triceps, knees, and ankles. Rapid alternating movement and fine finger movements intact. Unable to asses romberg or gait due to weakness.   Skin: Skin is warm and dry. Capillary refill takes less than 2 seconds.  Nursing note and vitals reviewed.    ED Treatments / Results  Labs (all labs ordered are listed, but only abnormal results are displayed) Labs Reviewed  COMPREHENSIVE METABOLIC PANEL - Abnormal; Notable for the following:       Result Value   Glucose, Bld 122 (*)    Creatinine, Ser 1.11 (*)    Calcium 8.4 (*)    Albumin 3.4 (*)    ALT 13 (*)    GFR calc non Af Amer 46 (*)    GFR calc Af Amer 54 (*)    All other components within normal limits  CBC WITH DIFFERENTIAL/PLATELET - Abnormal; Notable for the following:    RBC 3.47 (*)    Hemoglobin 10.1 (*)    HCT 31.7 (*)    RDW 16.7 (*)    All other components within normal limits  D-DIMER, QUANTITATIVE (NOT AT [facility]) - Abnormal; Notable for the following:    D-Dimer, Quant 4.26 (*)    All other components within normal limits  URINALYSIS, ROUTINE W REFLEX MICROSCOPIC (NOT AT [facility]) - Abnormal; Notable for the following:    APPearance CLOUDY (*)    Nitrite POSITIVE (*)    Leukocytes, UA SMALL (*)    All other components within normal limits  URINE MICROSCOPIC-ADD ON - Abnormal; Notable for the following:    Squamous Epithelial / LPF 0-5 (*)    Bacteria, UA RARE (*)    All other components within normal limits  URINE CULTURE  LIPASE, BLOOD  BRAIN NATRIURETIC PEPTIDE  I-STAT TROPOININ, ED    EKG  EKG  Interpretation  Date/Time:  Friday September 13 2016 18:04:25 EST Ventricular Rate:  72 PR Interval:    QRS Duration: 128 QT Interval:  429 QTC Calculation: 470 R Axis:   -57 Text Interpretation:  Sinus rhythm Prolonged PR interval Left bundle branch block Baseline wander Nonspecific T wave abnormality No significant change since last tracing Confirmed by [facility] MD, PEDRO ([id number]) on 09/13/2016 6:22:16 PM       Radiology Dg Chest 2 View  Result Date: 09/13/2016 CLINICAL DATA:  Fall out of bed, found on floor EXAM: CHEST  2 VIEW COMPARISON:  03/11/2016 FINDINGS: Surgical clips at the base of the neck. Surgical clips in the right axilla. No acute pulmonary infiltrate, consolidation, or pleural effusion. Cardiomediastinal silhouette within normal limits. Atherosclerosis of the aorta. No pneumothorax. Partially visualized left humerus prosthesis. IMPRESSION: 1. No acute infiltrate or edema 2. Atherosclerosis of the aorta Electronically Signed   By: Donavan Foil M.D.   On: 09/13/2016 18:41   Ct Head Wo Contrast  Result Date: 09/13/2016 CLINICAL DATA:  Fall out of bed with headaches and neck pain, initial encounter EXAM: CT HEAD WITHOUT CONTRAST CT CERVICAL SPINE WITHOUT CONTRAST TECHNIQUE: Multidetector CT imaging of the head and cervical spine was performed following the standard protocol without intravenous contrast. Multiplanar CT image reconstructions of the cervical spine were also generated. COMPARISON:  None. FINDINGS: CT HEAD FINDINGS Brain: No evidence of acute infarction, hemorrhage, hydrocephalus, extra-axial collection or mass lesion/mass effect. Atrophic changes are noted. Vascular: No hyperdense vessel or unexpected calcification. Skull: Normal. Negative for fracture or focal lesion. Sinuses/Orbits: No acute finding. Other: None. CT CERVICAL SPINE FINDINGS Alignment: Mild  anterolisthesis of C4 on C5 and C5 on C6 is noted of a degenerative nature. Skull base and vertebrae: Multilevel  facet hypertrophic changes are seen. Disc space narrowing is noted at C4-5 and C5-6. Anterolisthesis is noted as described above. No acute fracture or acute facet abnormality is noted. Soft tissues and spinal canal: Within normal limits. Carotid calcifications are noted bilaterally. Disc levels:  Disc space narrowing at C4-5 and C5-6 as described. Upper chest: Negative. IMPRESSION: CT of the head: Atrophic changes without acute abnormality. CT of the cervical spine: Multilevel degenerative change as described. No acute abnormality noted. Electronically Signed   By: Inez Catalina M.D.   On: 09/13/2016 18:29   Ct Angio Chest Pe W/cm &/or Wo Cm  Result Date: 09/13/2016 CLINICAL DATA:  80 year old female with fatigue and abnormal D-dimer. Initial encounter. EXAM: CT ANGIOGRAPHY CHEST WITH CONTRAST TECHNIQUE: Multidetector CT imaging of the chest was performed using the standard protocol during bolus administration of intravenous contrast. Multiplanar CT image reconstructions and MIPs were obtained to evaluate the vascular anatomy. CONTRAST:  100 mL Isovue 370 COMPARISON:  Chest CTA 03/06/2012. CT Abdomen and Pelvis 06/28/2016. FINDINGS: Cardiovascular: Good contrast bolus timing in the pulmonary arterial tree. No focal filling defect identified in the pulmonary arteries to suggest acute pulmonary embolism. No pericardial effusion. Extensive soft and calcified atherosclerosis of the visible aorta. Calcified coronary artery atherosclerosis. Mediastinum/Nodes: No lymphadenopathy. Lungs/Pleura: Major airways are patent. Mildly improved lung volumes. Resolved small bilateral pleural effusions that were present in 2013. Mild dependent atelectasis and/or scarring. No other acute pulmonary opacity. Upper Abdomen: Gallbladder probably is surgically absent. Prominent intrahepatic bile ducts, but somewhat chronic. No upper abdominal free fluid. Stable and negative spleen, visible stomach and right adrenal gland. Other  findings: Postoperative changes from thyroidectomy and right axillary node dissection. Musculoskeletal: Advanced degenerative changes in the spine. No acute or suspicious osseous lesion. Review of the MIP images confirms the above findings. IMPRESSION: 1. No evidence of acute pulmonary embolus. No acute findings in the chest. 2. Chronic but questionably increased intrahepatic bile duct enlargement. Query hyperbilirubinemia. 3. Extensive calcified aortic atherosclerosis. Calcified coronary artery atherosclerosis. Electronically Signed   By: Genevie Ann M.D.   On: 09/13/2016 21:17   Ct Cervical Spine Wo Contrast  Result Date: 09/13/2016 CLINICAL DATA:  Fall out of bed with headaches and neck pain, initial encounter EXAM: CT HEAD WITHOUT CONTRAST CT CERVICAL SPINE WITHOUT CONTRAST TECHNIQUE: Multidetector CT imaging of the head and cervical spine was performed following the standard protocol without intravenous contrast. Multiplanar CT image reconstructions of the cervical spine were also generated. COMPARISON:  None. FINDINGS: CT HEAD FINDINGS Brain: No evidence of acute infarction, hemorrhage, hydrocephalus, extra-axial collection or mass lesion/mass effect. Atrophic changes are noted. Vascular: No hyperdense vessel or unexpected calcification. Skull: Normal. Negative for fracture or focal lesion. Sinuses/Orbits: No acute finding. Other: None. CT CERVICAL SPINE FINDINGS Alignment: Mild anterolisthesis of C4 on C5 and C5 on C6 is noted of a degenerative nature. Skull base and vertebrae: Multilevel facet hypertrophic changes are seen. Disc space narrowing is noted at C4-5 and C5-6. Anterolisthesis is noted as described above. No acute fracture or acute facet abnormality is noted. Soft tissues and spinal canal: Within normal limits. Carotid calcifications are noted bilaterally. Disc levels:  Disc space narrowing at C4-5 and C5-6 as described. Upper chest: Negative. IMPRESSION: CT of the head: Atrophic changes without  acute abnormality. CT of the cervical spine: Multilevel degenerative change as described. No acute abnormality noted. Electronically  Signed   By: Inez Catalina M.D.   On: 09/13/2016 18:29    Procedures Procedures (including critical care time)  Medications Ordered in ED Medications  iopamidol (ISOVUE-370) 76 % injection 100 mL (100 mLs Intravenous Contrast Given 09/13/16 2054)  iopamidol (ISOVUE-300) 61 % injection (  Contrast Given 09/13/16 2054)  sodium chloride 0.9 % injection (  Given by Other 09/13/16 2054)  cefTRIAXone (ROCEPHIN) 1 g in dextrose 5 % 50 mL IVPB (0 g Intravenous Stopped 09/14/16 0121)     Initial Impression / Assessment and Plan / ED Course  I have reviewed the triage vital signs and the nursing notes.  Pertinent labs & imaging results that were available during my care of the patient were reviewed by me and considered in my medical decision making (see chart for details).  Clinical Course   Patient presents to the ED after falling out of bed and hitting her head. She also endorses weakness for the past 3 days. Exam was unremarkable. Patient denies any pain at this time. No focal neurological deficits noted. Patient has no signs of intracranial, intrathoracic, intra-abdominal injuries. CAT scan of head and cervical spine were unremarkable. Chest x-ray unremarkable. EKG without any acute changes. Patient's labs have been grossly unremarkable. Creatinine is at baseline. Patient is with microcytic anemia that is at baseline and up from previous lab results. D dimer was obtained given patient's acute hypoxia on arrival to the ED and reports shortness of breath. d-dimer was noted to be elevated CTA of the chest was performed. No signs of pulmonary embolism was seen. Patient has no signs of pneumonia. She is satting at 94-96% on room air. Patient was able to ambulate in the hallway with a walker and assistance of the nurse and patient states she ambulates at home with a walker at  baseline. Sats while ambulating were 94%. She has no complaints at this time. UA shows signs of urinary tract infection. This is likely causing patient's weakness. Rocephin given in the ED and patient was discharged home on 7 day course of Keflex. Patient is a follow-up with her primary care doctor in the outpatient setting next week for repeat labs and exam. Patient has no acute complaints at this time. She is hemodynamically stable and in no acute distress. Vital signs are normal. Patient was discharged back home. She was given strict return precautions. Patient was seen and examined by Dr. Leonette Monarch, who agrees with the above plan he recommended fluid bolus.   Final Clinical Impressions(s) / ED Diagnoses   Final diagnoses:  Acute cystitis without hematuria  Fall, initial encounter  Weakness    New Prescriptions New Prescriptions   CEPHALEXIN (KEFLEX) 500 MG CAPSULE    Take 1 capsule (500 mg total) by mouth 2 (two) times daily.     Doristine Devoid, PA-C 09/14/16 0155    Fatima Blank, MD 09/14/16 [phone]

## 2016-09-14 ENCOUNTER — Emergency Department (HOSPITAL_COMMUNITY): Payer: Medicare Other

## 2016-09-14 DIAGNOSIS — R0989 Other specified symptoms and signs involving the circulatory and respiratory systems: Secondary | ICD-10-CM | POA: Diagnosis not present

## 2016-09-14 DIAGNOSIS — N3000 Acute cystitis without hematuria: Secondary | ICD-10-CM | POA: Diagnosis not present

## 2016-09-14 MED ORDER — ACETAMINOPHEN 325 MG PO TABS
650.0000 mg | ORAL_TABLET | Freq: Once | ORAL | Status: AC
  Administered 2016-09-14: 650 mg via ORAL
  Filled 2016-09-14: qty 2

## 2016-09-14 MED ORDER — ACETAMINOPHEN 325 MG PO TABS
650.0000 mg | ORAL_TABLET | Freq: Four times a day (QID) | ORAL | Status: DC | PRN
  Administered 2016-09-14 – 2016-09-16 (×6): 650 mg via ORAL
  Filled 2016-09-14 (×6): qty 2

## 2016-09-14 MED ORDER — CEPHALEXIN 500 MG PO CAPS: 500.0000 mg | ORAL_CAPSULE | Freq: Two times a day (BID) | ORAL | 0 refills | Status: AC

## 2016-09-14 NOTE — ED Notes (Signed)
Pt's son is present in room with patient, son has concerns about patient being released back to her home because this is her 4th fall in the past year, per the son the patient does not have anyone to help at home and patient lives alone as he lives in [place], son also states that he is POA/POH and that he provided this paperwork to the hospital earlier this year when the patient was admitted to the hospital.  Per the patient she wants to go home, contacted SW, consult ordered.

## 2016-09-14 NOTE — ED Notes (Signed)
Pt awaiting son to pick up. Son en route from [place], [place].

## 2016-09-14 NOTE — Discharge Instructions (Signed)
Please take the antibiotics twice a day for 7 days. Your urine showed signs of urinary tract infection. This is likely causing your weakness. Please continue to drink plenty of fluids and stay hydrated. You need to follow-up with her primary care doctor this week for a recheck.

## 2016-09-14 NOTE — ED Notes (Signed)
Pt reports that she "got choked on eggs" this am at breakfast, ever since then the patient has been coughing up secretions into a cup, EDP notified, new order for chest xray placed.

## 2016-09-14 NOTE — ED Provider Notes (Signed)
Patient seen and examined. Spoke with patient's son and she agrees that she is unsafe to go home. Have counseled did Education officer, museum for placement as well as PT for evaluation   Lacretia Leigh, MD 09/14/16 1418

## 2016-09-14 NOTE — ED Notes (Signed)
Pt.'s son, [name]  Notified thru phone of  pt's discharged back to her residence. Pt.'s son asked to speak to ED provider, requested pt. To be placed in an Assisted Living, son was worried of mother being by herself in her house and being weak .  Pt. Is for case management consult.

## 2016-09-14 NOTE — Progress Notes (Signed)
CSW spoke with patient at bedside, patient's son present. CSW provided update on placment process. CSW notified patient's RN on placement status.

## 2016-09-14 NOTE — Progress Notes (Signed)
CSW spoke with patient at bedside, patient's son present. Patient's son reported concern for patient's safety returning home upon discharge. Patient's son reported that patient has had multiple falls and is worried about her being home alone. Patient's son reported that the patient has received in home health services and that it was not successful. CSW inquired about patient's plan after being discharged. Patient reported that she will go to a facility upon discharge. CSW explained placement process and provided patient and patient's son with resources regarding local facilities. Patient currently awaiting PT evaluation to determine recommended level of care.

## 2016-09-14 NOTE — NC FL2 (Signed)
[place] LEVEL OF CARE SCREENING TOOL     IDENTIFICATION  Patient Name: Olivia Mullen Birthdate: December 04, 1935 Sex: female Admission Date (Current Location): 09/13/2016  [facility] and [place] Number:  Herbalist and Address:  [facility],  [place] [place], [place]      Provider Number: [id number]  Attending Physician Name and Address:  Fatima Blank, MD  Relative Name and Phone Number:  Carolene Potrykus [phone]    Current Level of Care: Hospital Recommended Level of Care: [place] Prior Approval Number:    Date Approved/Denied:   PASRR Number: [id number]  Discharge Plan: SNF    Current Diagnoses: Patient Active Problem List   Diagnosis Date Noted  . Protein-calorie malnutrition, severe 06/29/2016  . AKI (acute kidney injury) ([place]) 06/28/2016  . Diarrhea 06/28/2016  . Acute blood loss anemia 03/11/2016  . Gastric ulcer with hemorrhage 03/11/2016  . Hyperlipidemia 02/19/2015  . Postoperative anemia due to acute blood loss 10/12/2013  . Osteoarthritis of right hip 08/31/2013  . Depression   . Chronic systolic heart failure ([place])   . Hypertension 03/08/2012  . Breast cancer, stage 2 ([place]) 12/16/2011  . Rheumatoid arthritis(714.0) 12/16/2011  . Multinodular goiter (nontoxic) 12/16/2011  . Hypothyroidism (acquired) 12/16/2011  . DM type 2 (diabetes mellitus, type 2) ([place]) 12/16/2011  . Normochromic normocytic anemia 12/16/2011    Orientation RESPIRATION BLADDER Height & Weight     Self, Time, Situation, Place  Normal Continent (wears brief but is able to verbalize when she needs to go to the bathroom) Weight:   Height:     BEHAVIORAL SYMPTOMS/MOOD NEUROLOGICAL BOWEL NUTRITION STATUS  Other (Comment) (none)  (none) Continent (wears brief but is able to verbalize when she needs to go to the bathroom) Diet  AMBULATORY STATUS COMMUNICATION OF NEEDS Skin   Extensive Assist Verbally Normal                       Personal Care Assistance Level of Assistance  Feeding, Bathing, Dressing Bathing Assistance: Maximum assistance Feeding assistance: Independent Dressing Assistance: Limited assistance     Functional Limitations Info  Sight, Hearing, Speech Sight Info: Adequate Hearing Info: Adequate Speech Info: Adequate    SPECIAL CARE FACTORS FREQUENCY  PT (By licensed PT), OT (By licensed OT), Blood pressure Blood Pressure Frequency: Hypertension    PT Frequency: 5x/week OT Frequency: 5x/week            Contractures Contractures Info: Not present    Additional Factors Info  Code Status, Allergies, Psychotropic Code Status Info: Full Allergies Info: Gabapentin; Isosorbide Nitrate; Zonisamide; Psychotropic Info: doxepin; clonazePAM; sertraline;          Current Medications (09/14/2016):  This is the current hospital active medication list Current Facility-Administered Medications  Medication Dose Route Frequency Provider Last Rate Last Dose  . acetaminophen (TYLENOL) tablet 650 mg  650 mg Oral Q6H PRN Fatima Blank, MD   650 mg at 09/14/16 1518   Current Outpatient Prescriptions  Medication Sig Dispense Refill  . acetaminophen (TYLENOL) 500 MG tablet Take 1,000 mg by mouth every 6 (six) hours as needed for moderate pain.    . budesonide (ENTOCORT EC) 3 MG 24 hr capsule Take 3 capsules (9 mg total) by mouth daily. 30 capsule 0  . butalbital-acetaminophen-caffeine (FIORICET, ESGIC) 50-325-40 MG tablet Take 1 tablet by mouth 2 (two) times daily as needed for headache.    . carvedilol (COREG) 6.25 MG  tablet TAKE 1 TABLET (6.25 MG TOTAL) BY MOUTH 2 (TWO) TIMES DAILY WITH A MEAL. 60 tablet 10  . cholecalciferol (VITAMIN D) 1000 UNITS tablet Take 2,000 Units by mouth daily.     . clonazePAM (KLONOPIN) 0.5 MG tablet Take 1 mg by mouth at bedtime.     Marland Kitchen doxepin (SINEQUAN) 50 MG capsule Take 50 mg by mouth at bedtime.    . folic acid (FOLVITE) 1 MG tablet Take 1 mg  by mouth daily.     . furosemide (LASIX) 20 MG tablet Take 20 mg by mouth daily.    Marland Kitchen glycopyrrolate (ROBINUL) 2 MG tablet Take 2 mg by mouth 2 (two) times daily.  6  . HUMIRA PEN 40 MG/0.8ML injection Inject 40 mg into the skin every 14 (fourteen) days.     Marland Kitchen KLOR-CON M20 20 MEQ tablet Take 20 mEq by mouth daily.    Marland Kitchen leflunomide (ARAVA) 20 MG tablet Take 20 mg by mouth daily.    Marland Kitchen levothyroxine (SYNTHROID, LEVOTHROID) 75 MCG tablet Take 75 mcg by mouth daily before breakfast.    . loperamide (IMODIUM) 2 MG capsule Take 1 capsule (2 mg total) by mouth as needed for diarrhea or loose stools. (Patient taking differently: Take 2-4 mg by mouth as needed for diarrhea or loose stools. ) 30 capsule 0  . losartan (COZAAR) 25 MG tablet Take 25 mg by mouth daily.    . Multiple Vitamins-Minerals (ADULT GUMMY PO) Take 2 tablets by mouth daily.    . pantoprazole (PROTONIX) 40 MG tablet Take 1 tablet (40 mg total) by mouth 2 (two) times daily. (Patient taking differently: Take 40 mg by mouth daily with breakfast. ) 60 tablet 3  . Probiotic Product (PROBIOTIC DAILY PO) Take 2 tablets by mouth daily.     . rizatriptan (MAXALT) 10 MG tablet Take 1 tablet by mouth every 2 (two) hours as needed for migraine.    . sertraline (ZOLOFT) 100 MG tablet Take 100 mg by mouth at bedtime.     . simvastatin (ZOCOR) 10 MG tablet Take 10 mg by mouth daily.    . cephALEXin (KEFLEX) 500 MG capsule Take 1 capsule (500 mg total) by mouth 2 (two) times daily. 14 capsule 0  . feeding supplement, ENSURE ENLIVE, (ENSURE ENLIVE) LIQD Take 237 mLs by mouth 2 (two) times daily between meals. (Patient not taking: Reported on 09/13/2016) 237 mL 12     Discharge Medications: Follow current medications.   Relevant Imaging Results:  Relevant Lab Results:   Additional Information    Burnis Medin, LCSW

## 2016-09-14 NOTE — ED Notes (Signed)
Bed: [place] Expected date:  Expected time:  Means of arrival:  Comments: Hold for 31

## 2016-09-14 NOTE — NC FL2 (Signed)
[place] LEVEL OF CARE SCREENING TOOL     IDENTIFICATION  Patient Name: Olivia Mullen Birthdate: May 26, 1936 Sex: female Admission Date (Current Location): 09/13/2016  [facility] and [place] Number:  Herbalist and Address:  [facility],  [place], [place]      Provider Number: [id number]  Attending Physician Name and Address:  Fatima Blank, MD  Relative Name and Phone Number:  Joline Slominski [phone]    Current Level of Care: Hospital Recommended Level of Care: [place] Prior Approval Number:    Date Approved/Denied:   PASRR Number: [id number]  Discharge Plan: SNF    Current Diagnoses: Patient Active Problem List   Diagnosis Date Noted  . Protein-calorie malnutrition, severe 06/29/2016  . AKI (acute kidney injury) ([place]) 06/28/2016  . Diarrhea 06/28/2016  . Acute blood loss anemia 03/11/2016  . Gastric ulcer with hemorrhage 03/11/2016  . Hyperlipidemia 02/19/2015  . Postoperative anemia due to acute blood loss 10/12/2013  . Osteoarthritis of right hip 08/31/2013  . Depression   . Chronic systolic heart failure ([place])   . Hypertension 03/08/2012  . Breast cancer, stage 2 ([place]) 12/16/2011  . Rheumatoid arthritis(714.0) 12/16/2011  . Multinodular goiter (nontoxic) 12/16/2011  . Hypothyroidism (acquired) 12/16/2011  . DM type 2 (diabetes mellitus, type 2) ([place]) 12/16/2011  . Normochromic normocytic anemia 12/16/2011    Orientation RESPIRATION BLADDER Height & Weight     Self, Time, Situation, Place  Normal Continent (wears brief but is able to verbalize when she needs to go to the bathroom) Weight:   Height:     BEHAVIORAL SYMPTOMS/MOOD NEUROLOGICAL BOWEL NUTRITION STATUS  Other (Comment) (none)  (none) Continent (wears brief but is able to verbalize when she needs to go to the bathroom) Diet heart healthy  AMBULATORY STATUS COMMUNICATION OF NEEDS Skin   Extensive Assist Verbally  Normal                       Personal Care Assistance Level of Assistance  Feeding, Bathing, Dressing Bathing Assistance: Maximum assistance Feeding assistance: Independent Dressing Assistance: Limited assistance     Functional Limitations Info  Sight, Hearing, Speech Sight Info: Adequate Hearing Info: Adequate Speech Info: Adequate    SPECIAL CARE FACTORS FREQUENCY  PT (By licensed PT), OT (By licensed OT), Blood pressure Blood Pressure Frequency: Hypertension    PT Frequency: 5x/week OT Frequency: 5x/week            Contractures Contractures Info: Not present    Additional Factors Info  Code Status, Allergies, Psychotropic Code Status Info: Full Allergies Info: Gabapentin; Isosorbide Nitrate; Zonisamide; Psychotropic Info: doxepin; clonazePAM; sertraline;          Current Medications (09/14/2016):  This is the current hospital active medication list Current Facility-Administered Medications  Medication Dose Route Frequency Provider Last Rate Last Dose  . acetaminophen (TYLENOL) tablet 650 mg  650 mg Oral Q6H PRN Fatima Blank, MD   650 mg at 09/14/16 1518   Current Outpatient Prescriptions  Medication Sig Dispense Refill  . acetaminophen (TYLENOL) 500 MG tablet Take 1,000 mg by mouth every 6 (six) hours as needed for moderate pain.    . budesonide (ENTOCORT EC) 3 MG 24 hr capsule Take 3 capsules (9 mg total) by mouth daily. 30 capsule 0  . butalbital-acetaminophen-caffeine (FIORICET, ESGIC) 50-325-40 MG tablet Take 1 tablet by mouth 2 (two) times daily as needed for headache.    . carvedilol (  COREG) 6.25 MG tablet TAKE 1 TABLET (6.25 MG TOTAL) BY MOUTH 2 (TWO) TIMES DAILY WITH A MEAL. 60 tablet 10  . cholecalciferol (VITAMIN D) 1000 UNITS tablet Take 2,000 Units by mouth daily.     . clonazePAM (KLONOPIN) 0.5 MG tablet Take 1 mg by mouth at bedtime.     Marland Kitchen doxepin (SINEQUAN) 50 MG capsule Take 50 mg by mouth at bedtime.    . folic acid (FOLVITE) 1 MG  tablet Take 1 mg by mouth daily.     . furosemide (LASIX) 20 MG tablet Take 20 mg by mouth daily.    Marland Kitchen glycopyrrolate (ROBINUL) 2 MG tablet Take 2 mg by mouth 2 (two) times daily.  6  . HUMIRA PEN 40 MG/0.8ML injection Inject 40 mg into the skin every 14 (fourteen) days.     Marland Kitchen KLOR-CON M20 20 MEQ tablet Take 20 mEq by mouth daily.    Marland Kitchen leflunomide (ARAVA) 20 MG tablet Take 20 mg by mouth daily.    Marland Kitchen levothyroxine (SYNTHROID, LEVOTHROID) 75 MCG tablet Take 75 mcg by mouth daily before breakfast.    . loperamide (IMODIUM) 2 MG capsule Take 1 capsule (2 mg total) by mouth as needed for diarrhea or loose stools. (Patient taking differently: Take 2-4 mg by mouth as needed for diarrhea or loose stools. ) 30 capsule 0  . losartan (COZAAR) 25 MG tablet Take 25 mg by mouth daily.    . Multiple Vitamins-Minerals (ADULT GUMMY PO) Take 2 tablets by mouth daily.    . pantoprazole (PROTONIX) 40 MG tablet Take 1 tablet (40 mg total) by mouth 2 (two) times daily. (Patient taking differently: Take 40 mg by mouth daily with breakfast. ) 60 tablet 3  . Probiotic Product (PROBIOTIC DAILY PO) Take 2 tablets by mouth daily.     . rizatriptan (MAXALT) 10 MG tablet Take 1 tablet by mouth every 2 (two) hours as needed for migraine.    . sertraline (ZOLOFT) 100 MG tablet Take 100 mg by mouth at bedtime.     . simvastatin (ZOCOR) 10 MG tablet Take 10 mg by mouth daily.    . cephALEXin (KEFLEX) 500 MG capsule Take 1 capsule (500 mg total) by mouth 2 (two) times daily. 14 capsule 0  . feeding supplement, ENSURE ENLIVE, (ENSURE ENLIVE) LIQD Take 237 mLs by mouth 2 (two) times daily between meals. (Patient not taking: Reported on 09/13/2016) 237 mL 12     Discharge Medications: Please see discharge summary for a list of discharge medications.  Relevant Imaging Results:  Relevant Lab Results:   Additional Information    Burnis Medin, LCSW

## 2016-09-14 NOTE — ED Notes (Signed)
Paged PT to notify that patient has a eval ordered for discharge dispo.

## 2016-09-14 NOTE — ED Notes (Signed)
Pt returned to room from xray.

## 2016-09-14 NOTE — Progress Notes (Addendum)
PT EVALUATION.    Pt admitted s/p fall at home and presents with functional mobility limitations 2* generalized weakness, questionable safety awareness and significant ambulatory balance deficits.  Pt lives alone and is currently a high risk for repeated falls and would greatly benefit from follow up rehab at SNF level to maximize safety and IND.   09/14/16 1600  PT Visit Information  Last PT Received On 09/14/16  Assistance Needed +1  History of Present Illness Pt admitted from home s/p fall and unable to get up.  Pt with hx of falls and numerous recent ED visits.  Tp wtih Hx of RA, CHF, R mastecromy and R THR (14)  Precautions  Precautions Fall  Restrictions  Weight Bearing Restrictions No  Home Living  Family/patient expects to be discharged to: Skilled nursing facility  Additional Comments Pt and family very concerned for pt safety living alone  Prior Function  Level of Independence Independent with assistive device(s)  Comments Pt uses 4 Wh RW or QC to mobilize but with hx of multiple falls.  Communication  Communication No difficulties  Pain Assessment  Pain Assessment No/denies pain  Cognition  Arousal/Alertness Awake/alert  Behavior During Therapy WFL for tasks assessed/performed  Overall Cognitive Status Within Functional Limits for tasks assessed  Upper Extremity Assessment  Upper Extremity Assessment Generalized weakness  Lower Extremity Assessment  Lower Extremity Assessment Generalized weakness  Cervical / Trunk Assessment  Cervical / Trunk Assessment Kyphotic  Bed Mobility  Overal bed mobility Needs Assistance  Bed Mobility Supine to Sit;Sit to Supine  Supine to sit Min assist  Sit to supine Min guard  General bed mobility comments Assist steady initially with move to sitting  Transfers  Overall transfer level Needs assistance  Equipment used None  Transfers Sit to/from Stand  Sit to Stand Min assist  General transfer comment cues for safe transition position  and use of UEs to self assist.  Physical assist to bring wt up and fwd and to steady in standing  Ambulation/Gait  Ambulation/Gait assistance Min assist;Mod assist  Ambulation Distance (Feet) 75 Feet  Assistive device Rolling walker (2 wheeled);None  Gait Pattern/deviations Step-through pattern;Decreased step length - right;Decreased step length - left;Shuffle;Scissoring;Trunk flexed;Narrow base of support;Antalgic  General Gait Details R trandelenberg noted.  Pt very unsteady with buckling at knees and reaching for support.  RW partially compensated.  Pt requiring cues for posture, position from RW and for safe RW management  Gait velocity decr  Gait velocity interpretation Below normal speed for age/gender  Balance  Overall balance assessment Needs assistance  Sitting-balance support No upper extremity supported;Feet supported  Sitting balance-Leahy Scale Good  Standing balance support No upper extremity supported  Standing balance-Leahy Scale Poor  PT - End of Session  Equipment Utilized During Treatment Gait belt  Activity Tolerance Patient limited by fatigue  Patient left in bed;with call bell/phone within reach;with family/visitor present  Nurse Communication Mobility status  PT Assessment  PT Recommendation/Assessment Patient needs continued PT services  PT Problem List Decreased strength;Decreased range of motion;Decreased activity tolerance;Decreased mobility;Decreased balance;Decreased knowledge of use of DME;Decreased safety awareness  PT Plan  PT Frequency (ACUTE ONLY) Min 3X/week  PT Treatment/Interventions (ACUTE ONLY) DME instruction;Gait training;Stair training;Functional mobility training;Therapeutic activities;Therapeutic exercise;Patient/family education;Balance training  PT Recommendation  Recommendations for Other Services OT consult  Follow Up Recommendations SNF  PT equipment None recommended by PT  Individuals Consulted  Consulted and Agree with Results and  Recommendations Patient;Family member/caregiver  Family Member Consulted son  Acute Rehab PT Goals  Patient Stated Goal Not fall again  PT Goal Formulation With patient  Time For Goal Achievement 09/28/16  Potential to Achieve Goals Fair  PT Time Calculation  PT Start Time (ACUTE ONLY) 1554  PT Stop Time (ACUTE ONLY) 1625  PT Time Calculation (min) (ACUTE ONLY) 31 min  PT G-Codes **NOT FOR INPATIENT CLASS**  Functional Assessment Tool Used Clinical Judgement  Functional Limitation Mobility: Walking and moving around  Mobility: Walking and Moving Around Current Status [id number]) CJ  Mobility: Walking and Moving Around Goal Status [id number]) CI  PT General Charges  $$ ACUTE PT VISIT 1 Procedure  PT Evaluation  $PT Eval Low Complexity 1 Procedure  PT Treatments  $Gait Training 8-22 mins  Written Expression  Dominant Hand Right

## 2016-09-14 NOTE — Care Management Note (Signed)
Case Management Note  Patient Details  Name: Olivia Mullen MRN: [id number] Date of Birth: [birth year]  Subjective/Objective:     falls               Action/Plan: Discharge Planning: AVS reviewed: Chart reviewed. CSW working on SNF placement.    Expected Discharge Date:                  Expected Discharge Plan:  Skilled Nursing Facility  In-House Referral:  Clinical Social Work  Discharge planning Services  CM Consult  Post Acute Care Choice:  NA Choice offered to:  NA  DME Arranged:  N/A DME Agency:  NA  HH Arranged:  NA HH Agency:  NA  Status of Service:  Completed, signed off  If discussed at [place] of Stay Meetings, dates discussed:    Additional Comments:  Erenest Rasher, RN 09/14/2016, 5:17 PM

## 2016-09-14 NOTE — ED Notes (Signed)
Pt still having difficulty swallowing and coughing some secretions, MD notified, order placed for speech therapist swallow eval. Report given to Aileen Fass RN

## 2016-09-14 NOTE — ED Notes (Signed)
Bed: WA31 Expected date:  Expected time:  Means of arrival:  Comments: 

## 2016-09-15 NOTE — ED Notes (Signed)
AC spoke with son regarding placement.  He has appointments tomorrow to speak about getting her placed at a facility.

## 2016-09-15 NOTE — ED Notes (Addendum)
Pt.'s son brought pt.'s personal belongings to bedside ; includes 1 black wallet with driver's license, 1 pair of sandal, 1 pink night gown and 3 blouses , black purse. Placed in a pt.'s bag ,labelled and kept at the pt.'s belonging's cabinet. Pt. Keeping her cell phone with her at bedside. Pt.'s son took pt.'s  wedding ring with him for safe keeping, witnessed by [place], [place].

## 2016-09-15 NOTE — Progress Notes (Signed)
CSW contacted by Abigail Butts from [facility] informed CSW that patient could be transported to the facility and that the nurse needed to call report at [phone].   CSW spoke with patient at bedside and informed patient about information listed above. Patient thanked CSW. CSW agreed to inform patient's son about patient going to [facility] today.

## 2016-09-15 NOTE — ED Notes (Signed)
Bed: WA26 Expected date:  Expected time:  Means of arrival:  Comments: 

## 2016-09-15 NOTE — Progress Notes (Signed)
  CSW contacted by Abigail Butts from [facility] informed CSW that patient had Medicare and [facility] secondary which would not cover the cost therefore, patient would not be accepted into the facility.   CSW contacted patient's son and informed him that patient's insurance would not cover the stay at [facility]. Patient's son reported that he would private pay because the patient could not be alone in her home. CSW provided patient's son with [facility] contact information Abigail Butts [phone]).

## 2016-09-15 NOTE — Progress Notes (Signed)
CSW contacted supervisor and debriefed case with supervisor. Supervisor informed CSW that patient would have to pay out of pocket to go to a SNF since the insurance would not cover the stay. CSW contacted patient's son and informed him that patient's insurance would not cover the stay at [facility]. Patient's son reported that he would private pay because the patient could not be alone in her home. CSW provided patient's son with [facility] contact information Abigail Butts [phone]). CSW contacted by patient's son. Patient's son reported that he did not understand how the insurance did not cover the stay and he was not sure that he would be able to private pay. Patient's son reported that patient could not go back to her home alone due to patient falling often and being unable to care for herself. Patient's son inquired about why patient was not admitted, CSW informed patient's son that the patient's EDP would have that information and that CSW was not able to provide that information. Patient's son reported that he needs an answer on what to do. CSW informed patient's son that CSW did not have an answer on what the patient's son should do. CSW reminded patient's son that PT recommended that patient go to a SNF and due to patient's insurance that the patient would need to pay out of pocket. Patient's son inquired about patient going to ALF versus SNF and how the process would differ. CSW informed patient's son that PT recommended that the patient go to SNF and that if the patient were to go to ALF that the patient would still have to private pay. CSW informed patient's son that he could contact [facility] to inquire about applying for Medicaid for patient.  Patient's son requested to be called by an advocate. CSW informed patient's son that the patient advocate hotline [phone]) was accessible Monday through Friday and offered that number to patient's son, patient's son  reported that he wanted to speak with someone immediately CSW provided patient's son with House supervisor phone number [phone]).  CSW spoke with patient at bedside. CSW informed patient that her insurance would not cover the stay at [facility] and that [place] had been in contact with the patient's son about private pay, patient replied okay.   CSW contacted by Abigail Butts from [facility] inquiring about patient coming to facility. CSW informed Abigail Butts that CSW provided patient's son with contact information for [facility] and was unaware if the patient would be coming to the facility.

## 2016-09-15 NOTE — Progress Notes (Signed)
CSW contacted by [place] in regards to placement for patient. CSW informed [name] about [employer]. [name] informed CSW that she would be able to make a bed offer after being able to verify patient's payer source tomorrow. CSW informed [name] that the Education officer, museum tomorrow would be [place] and the contact information would be the same.

## 2016-09-15 NOTE — Progress Notes (Signed)
CSW contacted [facility] and spoke with employee named Olivia Mullen [phone]) in regards to patient being accepted to the facility. Staff requested that patient's family member come into the facility at 1:45pm to fill out paperwork to obtain admission for patient into facility. CSW contacted by Olivia Mullen requesting information about patient's insurance. Olivia Mullen informed CSW that she would return phone call after speaking with another staff member in regards to the patient's insurance. CSW contacted by Olivia Mullen informed CSW that patient's insurance was accepted at the facility and requested return phone call if patient was going to be coming to the facility.   CSW spoke with patient at bedside. CSW informed patient that patient received bed offer at [facility] and that patient would need a family member to go fill out paperwork prior to patient going to facility. Patient reported that her son returned to [place] last night and that she could sign herself in. CSW informed patient that CSW would call her son and the facility to continue the process of getting patient placed. Patient thanked CSW.   CSW contacted patient's son Olivia Mullen [phone]), patient's son confirmed that he returned to [place] last night and would not be able to come and fill out paperwork. Patient's son reported that there were no other family members locally that could fill out paperwork. Patient's son inquired about additional options so that patient could be placed into facility today.   CSW contacted staff member Olivia Mullen and informed her that no family was available to fill out paperwork for patient. Olivia Mullen reported that patient could sign herself in and requested that [place] send discharge summary to the facility. Olivia Mullen informed CSW that she would call CSW after completing the patient's paperwork. CSW sent discharge summary to the facility via the [place].

## 2016-09-16 ENCOUNTER — Other Ambulatory Visit: Payer: Self-pay

## 2016-09-16 DIAGNOSIS — M6281 Muscle weakness (generalized): Secondary | ICD-10-CM | POA: Diagnosis not present

## 2016-09-16 DIAGNOSIS — A0472 Enterocolitis due to Clostridium difficile, not specified as recurrent: Secondary | ICD-10-CM | POA: Diagnosis not present

## 2016-09-16 DIAGNOSIS — I10 Essential (primary) hypertension: Secondary | ICD-10-CM | POA: Diagnosis not present

## 2016-09-16 DIAGNOSIS — G9341 Metabolic encephalopathy: Secondary | ICD-10-CM | POA: Diagnosis not present

## 2016-09-16 DIAGNOSIS — E43 Unspecified severe protein-calorie malnutrition: Secondary | ICD-10-CM | POA: Diagnosis not present

## 2016-09-16 DIAGNOSIS — N390 Urinary tract infection, site not specified: Secondary | ICD-10-CM | POA: Diagnosis not present

## 2016-09-16 DIAGNOSIS — E2740 Unspecified adrenocortical insufficiency: Secondary | ICD-10-CM | POA: Diagnosis present

## 2016-09-16 DIAGNOSIS — M150 Primary generalized (osteo)arthritis: Secondary | ICD-10-CM | POA: Diagnosis not present

## 2016-09-16 DIAGNOSIS — I110 Hypertensive heart disease with heart failure: Secondary | ICD-10-CM | POA: Diagnosis present

## 2016-09-16 DIAGNOSIS — I5042 Chronic combined systolic (congestive) and diastolic (congestive) heart failure: Secondary | ICD-10-CM | POA: Diagnosis present

## 2016-09-16 DIAGNOSIS — R4182 Altered mental status, unspecified: Secondary | ICD-10-CM | POA: Diagnosis present

## 2016-09-16 DIAGNOSIS — F332 Major depressive disorder, recurrent severe without psychotic features: Secondary | ICD-10-CM | POA: Diagnosis not present

## 2016-09-16 DIAGNOSIS — N3000 Acute cystitis without hematuria: Secondary | ICD-10-CM | POA: Diagnosis not present

## 2016-09-16 DIAGNOSIS — R402441 Other coma, without documented Glasgow coma scale score, or with partial score reported, in the field [EMT or ambulance]: Secondary | ICD-10-CM | POA: Diagnosis not present

## 2016-09-16 DIAGNOSIS — R531 Weakness: Secondary | ICD-10-CM | POA: Diagnosis not present

## 2016-09-16 DIAGNOSIS — R2689 Other abnormalities of gait and mobility: Secondary | ICD-10-CM | POA: Diagnosis not present

## 2016-09-16 DIAGNOSIS — N170 Acute kidney failure with tubular necrosis: Secondary | ICD-10-CM | POA: Diagnosis present

## 2016-09-16 DIAGNOSIS — E872 Acidosis: Secondary | ICD-10-CM | POA: Diagnosis present

## 2016-09-16 DIAGNOSIS — I447 Left bundle-branch block, unspecified: Secondary | ICD-10-CM | POA: Diagnosis present

## 2016-09-16 DIAGNOSIS — R0602 Shortness of breath: Secondary | ICD-10-CM | POA: Diagnosis not present

## 2016-09-16 DIAGNOSIS — K254 Chronic or unspecified gastric ulcer with hemorrhage: Secondary | ICD-10-CM | POA: Diagnosis not present

## 2016-09-16 DIAGNOSIS — R5381 Other malaise: Secondary | ICD-10-CM | POA: Diagnosis not present

## 2016-09-16 DIAGNOSIS — F4321 Adjustment disorder with depressed mood: Secondary | ICD-10-CM | POA: Diagnosis not present

## 2016-09-16 DIAGNOSIS — R198 Other specified symptoms and signs involving the digestive system and abdomen: Secondary | ICD-10-CM | POA: Diagnosis not present

## 2016-09-16 DIAGNOSIS — K52831 Collagenous colitis: Secondary | ICD-10-CM | POA: Diagnosis not present

## 2016-09-16 DIAGNOSIS — M0689 Other specified rheumatoid arthritis, multiple sites: Secondary | ICD-10-CM | POA: Diagnosis not present

## 2016-09-16 DIAGNOSIS — R4189 Other symptoms and signs involving cognitive functions and awareness: Secondary | ICD-10-CM | POA: Diagnosis not present

## 2016-09-16 DIAGNOSIS — J189 Pneumonia, unspecified organism: Secondary | ICD-10-CM | POA: Diagnosis present

## 2016-09-16 DIAGNOSIS — F419 Anxiety disorder, unspecified: Secondary | ICD-10-CM | POA: Diagnosis not present

## 2016-09-16 DIAGNOSIS — Z9181 History of falling: Secondary | ICD-10-CM | POA: Diagnosis not present

## 2016-09-16 DIAGNOSIS — I5021 Acute systolic (congestive) heart failure: Secondary | ICD-10-CM | POA: Diagnosis not present

## 2016-09-16 DIAGNOSIS — W19XXXA Unspecified fall, initial encounter: Secondary | ICD-10-CM | POA: Diagnosis not present

## 2016-09-16 DIAGNOSIS — D649 Anemia, unspecified: Secondary | ICD-10-CM | POA: Diagnosis present

## 2016-09-16 DIAGNOSIS — E870 Hyperosmolality and hypernatremia: Secondary | ICD-10-CM | POA: Diagnosis present

## 2016-09-16 DIAGNOSIS — F320 Major depressive disorder, single episode, mild: Secondary | ICD-10-CM | POA: Diagnosis not present

## 2016-09-16 DIAGNOSIS — R64 Cachexia: Secondary | ICD-10-CM | POA: Diagnosis present

## 2016-09-16 DIAGNOSIS — M069 Rheumatoid arthritis, unspecified: Secondary | ICD-10-CM | POA: Diagnosis present

## 2016-09-16 DIAGNOSIS — Z66 Do not resuscitate: Secondary | ICD-10-CM | POA: Diagnosis present

## 2016-09-16 DIAGNOSIS — R6521 Severe sepsis with septic shock: Secondary | ICD-10-CM | POA: Diagnosis present

## 2016-09-16 DIAGNOSIS — Z87891 Personal history of nicotine dependence: Secondary | ICD-10-CM | POA: Diagnosis not present

## 2016-09-16 DIAGNOSIS — R6520 Severe sepsis without septic shock: Secondary | ICD-10-CM | POA: Diagnosis not present

## 2016-09-16 DIAGNOSIS — D500 Iron deficiency anemia secondary to blood loss (chronic): Secondary | ICD-10-CM | POA: Diagnosis not present

## 2016-09-16 DIAGNOSIS — Z8249 Family history of ischemic heart disease and other diseases of the circulatory system: Secondary | ICD-10-CM | POA: Diagnosis not present

## 2016-09-16 DIAGNOSIS — I5022 Chronic systolic (congestive) heart failure: Secondary | ICD-10-CM | POA: Diagnosis not present

## 2016-09-16 DIAGNOSIS — D696 Thrombocytopenia, unspecified: Secondary | ICD-10-CM | POA: Diagnosis present

## 2016-09-16 DIAGNOSIS — M791 Myalgia: Secondary | ICD-10-CM | POA: Diagnosis not present

## 2016-09-16 DIAGNOSIS — A419 Sepsis, unspecified organism: Secondary | ICD-10-CM | POA: Diagnosis not present

## 2016-09-16 DIAGNOSIS — A048 Other specified bacterial intestinal infections: Secondary | ICD-10-CM | POA: Diagnosis not present

## 2016-09-16 DIAGNOSIS — E876 Hypokalemia: Secondary | ICD-10-CM | POA: Diagnosis not present

## 2016-09-16 DIAGNOSIS — N179 Acute kidney failure, unspecified: Secondary | ICD-10-CM | POA: Diagnosis not present

## 2016-09-16 DIAGNOSIS — E272 Addisonian crisis: Secondary | ICD-10-CM | POA: Diagnosis not present

## 2016-09-16 DIAGNOSIS — R51 Headache: Secondary | ICD-10-CM | POA: Diagnosis not present

## 2016-09-16 DIAGNOSIS — R41841 Cognitive communication deficit: Secondary | ICD-10-CM | POA: Diagnosis not present

## 2016-09-16 DIAGNOSIS — I639 Cerebral infarction, unspecified: Secondary | ICD-10-CM | POA: Diagnosis not present

## 2016-09-16 DIAGNOSIS — R259 Unspecified abnormal involuntary movements: Secondary | ICD-10-CM | POA: Diagnosis not present

## 2016-09-16 DIAGNOSIS — E039 Hypothyroidism, unspecified: Secondary | ICD-10-CM | POA: Diagnosis not present

## 2016-09-16 DIAGNOSIS — E119 Type 2 diabetes mellitus without complications: Secondary | ICD-10-CM | POA: Diagnosis not present

## 2016-09-16 DIAGNOSIS — Y95 Nosocomial condition: Secondary | ICD-10-CM | POA: Diagnosis not present

## 2016-09-16 DIAGNOSIS — E860 Dehydration: Secondary | ICD-10-CM | POA: Diagnosis present

## 2016-09-16 DIAGNOSIS — E46 Unspecified protein-calorie malnutrition: Secondary | ICD-10-CM | POA: Diagnosis not present

## 2016-09-16 DIAGNOSIS — R197 Diarrhea, unspecified: Secondary | ICD-10-CM | POA: Diagnosis not present

## 2016-09-16 LAB — URINE CULTURE: Culture: >=100000 — AB

## 2016-09-16 MED ORDER — DIPHENHYDRAMINE HCL 25 MG PO CAPS
25.0000 mg | ORAL_CAPSULE | Freq: Once | ORAL | Status: AC
  Administered 2016-09-16: 25 mg via ORAL
  Filled 2016-09-16: qty 1

## 2016-09-16 NOTE — Progress Notes (Signed)
Olivia Mullen with Olivia Mullen received FL2, therapy notes, and AVS. RN updated and transportation to be scheduled. Patient and patients family notified.   Kingsley Spittle, LCSWA Clinical Social Worker [phone]

## 2016-09-16 NOTE — Progress Notes (Addendum)
Staffed with Nurse  CSW spoke with [name] at [facility] regarding patient. She stated she was waiting for the bookkeeper to arrive for them to review at financial information. She asked if patients insurance is not accepted if she would be able to pay privately. CSW will have to discuss this information with patient and son again.  CSW spoke with patient's son, Olivia Mullen to see if insurance would not pay for a SNF if he would be able to private pay. He stated it would depend on the rates. He stated [facility] would be good because patient's home is close to the facility. CSW informed him he would be notified after speaking with Admissions at [facility].   [name], [place] Worker [phone] 8:36 AM

## 2016-09-16 NOTE — ED Notes (Signed)
Spoke with Junie Panning, Education officer, museum, she will be working on paper work to have patient be sent to a facility. Will wait for Erin for further instructions.

## 2016-09-16 NOTE — Patient Outreach (Addendum)
[facility]) Care Management  09/16/2016  Olivia Mullen December 27, 1935 [id number]   Referral received 09/06/16      Referral source: [facility] Referral reason: limited support system, history of falls, high risk for rehospitalization  Upon chart review noted patient currently admitted to [place] called Jon Billings, medical social worker with [facility].  Unable to reach.  HIPAA compliant voice message left with call back phone number.   PLAN;  RNCM will attempt 2nd telephone outreach to [employer] within 3 business days.  RNCM will notify hospital liaison of patients admission.   Quinn Plowman RN,BSN,CCM [facility] Telephonic  [phone]

## 2016-09-16 NOTE — ED Notes (Addendum)
Gave report to [place] with [facility]. Spoke with [facility] about transportation to facility for room 210.

## 2016-09-16 NOTE — Progress Notes (Signed)
CSW spoke with Olivia Mullen, Admissions Director to see if they would be able to accept patient. She stated she would be able to provide a bed offer for patient. She stated she would be able to come around 12:45pm to meet with patient for her to sign the paperwork for admission.   CSW met Olivia Mullen in patient's room today. CSW reminded patient to ask Olivia Mullen any questions she had for clarity. She stated she would need the [name] and discharge summary on the HUB for review.   CSW called and spoke with patient's son, Olivia Mullen [phone] to inform him that patient had been accepted at [name] and per the Admissions Director, the insurance would cover. CSW provided him with the name and contact number to call Olivia Mullen for clarity and any questions.    [name], LCSWA Clinical Social Worker [phone] 3:33 PM

## 2016-09-16 NOTE — Progress Notes (Signed)
CSW received call from [place] stating they will need AVS before patient can be transported. CSW will fax AVS.   Kingsley Spittle, [facility] Clinical Social Worker [phone]

## 2016-09-16 NOTE — Progress Notes (Signed)
SLP received order for swallow evaluation, spoke to RN [place] who reports pt stated she has problems with eggs on day of admit and was coughing up "foamy" secretions but since then has been eating and tolerating po well. Note pt does have h/o of EGD completed 03/12/2016.  MD if you desire please dc order, otherwise will complete later.  Informed RN and he agrees with plan.  Thanks.  Luanna Salk, [place] [facility] SLP [phone]

## 2016-09-17 ENCOUNTER — Other Ambulatory Visit: Payer: Self-pay

## 2016-09-17 ENCOUNTER — Telehealth (HOSPITAL_BASED_OUTPATIENT_CLINIC_OR_DEPARTMENT_OTHER): Payer: Self-pay | Admitting: Emergency Medicine

## 2016-09-17 ENCOUNTER — Encounter: Payer: Self-pay | Admitting: *Deleted

## 2016-09-17 NOTE — Patient Outreach (Addendum)
[facility]) Care Management  09/17/2016  Olivia Mullen 12/22/1935 [id number]   Telephone call to [facility] and rehab.  Spoke with AFo at facility.  Afo states that patient is being transferred to [place] today.   Contacted Ann Vern Claude, social worker with [facility].  Informed by Ms. Trammell that patient is being transferred to [facility] and rehab.    PLAN:  RNCM will refer patient to [facility] care management social worker to follow up on patients status at [place]. RNCM will refer patient to case management assistant.  Patient closed to telephonic outreach.    Quinn Plowman RN,BSN,CCM [facility] Telephonic  [phone]

## 2016-09-17 NOTE — Telephone Encounter (Signed)
Post ED Visit - Positive Culture Follow-up  Culture report reviewed by antimicrobial stewardship pharmacist:  []  Elenor Quinones, Pharm.D. []  Heide Guile, Pharm.D., BCPS []  Parks Neptune, Pharm.D. []  Alycia Rossetti, Pharm.D., BCPS []  [place], Pharm.D., BCPS, AAHIVP []  Legrand Como, Pharm.D., BCPS, AAHIVP []  Milus Glazier, Pharm.D. []  Stephens November, Pharm.D. Apryl Ouida [place] PharmD  Positive urine culture Treated with cephalexin, organism sensitive to the same and no further patient follow-up is required at this time.  Hazle Nordmann 09/17/2016, 1:14 PM

## 2016-09-18 ENCOUNTER — Other Ambulatory Visit: Payer: Self-pay | Admitting: *Deleted

## 2016-09-18 DIAGNOSIS — M0689 Other specified rheumatoid arthritis, multiple sites: Secondary | ICD-10-CM | POA: Diagnosis not present

## 2016-09-18 DIAGNOSIS — R5381 Other malaise: Secondary | ICD-10-CM | POA: Diagnosis not present

## 2016-09-18 DIAGNOSIS — M150 Primary generalized (osteo)arthritis: Secondary | ICD-10-CM | POA: Diagnosis not present

## 2016-09-18 DIAGNOSIS — F320 Major depressive disorder, single episode, mild: Secondary | ICD-10-CM | POA: Diagnosis not present

## 2016-09-19 ENCOUNTER — Encounter: Payer: Self-pay | Admitting: *Deleted

## 2016-09-19 NOTE — Patient Outreach (Signed)
[facility]) Care Management  09/19/2016  Olivia Mullen April 19, 1936 [id number]   CSW was able to make contact with patient today to perform the initial assessment, as well as assess and assist with social work needs and services, when [place] met with patient at [facility] [place] where patient currently resides to receive short-term rehabilitative services.  CSW introduced self, explained role and types of services provided through [facility] [facility] ([place] [facility]).  CSW further explained to patient that CSW works with patient's Telephonic RNCM, also with [facility], Quinn Plowman. CSW then explained the reason for the visit, indicating that Mrs. Olivia Mullen thought that patient would benefit from social work services and resources to assist with possible discharge planning needs and services from the skilled nursing facility.  CSW obtained two HIPAA compliant identifiers from patient, which included patient's name and date of birth. Patient was pleasant, but vague with regards to answering questions presented to her by CSW.  Patient admitted that she was unsure of her discharge planning arrangements, at patient indicated that she does not have a caregiver, or anyone that can look in on her from time-to-time, as her son, Olivia Mullen currently lives in [place].  CSW agreed to make contact with Mr. Flanagin, per patient's request, to discuss patient's discharge plan of care.  Patient was agreeable to working with CSW and plans to meet with CSW for the next scheduled visit at [place] on Thursday, December 14th to assess and assist with any additional discharge planning needs and services. Nat Christen, BSW, MSW, LCSW  Licensed Education officer, environmental Health System  Mailing [place] [address] [place], [place] [address] Physical Address-[address],  [place],  [address] Toll Free Main # [phone] Fax # [phone] Cell # [phone]  Fax # [phone]  [email or web address]

## 2016-09-20 DIAGNOSIS — I5022 Chronic systolic (congestive) heart failure: Secondary | ICD-10-CM | POA: Diagnosis not present

## 2016-09-20 DIAGNOSIS — M150 Primary generalized (osteo)arthritis: Secondary | ICD-10-CM | POA: Diagnosis not present

## 2016-09-20 DIAGNOSIS — F320 Major depressive disorder, single episode, mild: Secondary | ICD-10-CM | POA: Diagnosis not present

## 2016-09-20 DIAGNOSIS — M0689 Other specified rheumatoid arthritis, multiple sites: Secondary | ICD-10-CM | POA: Diagnosis not present

## 2016-10-03 ENCOUNTER — Other Ambulatory Visit: Payer: Self-pay | Admitting: *Deleted

## 2016-10-03 NOTE — Patient Outreach (Signed)
[facility]) Care Management  10/03/2016  LYNANNE VANDERWERFF 12/07/1935 [id number]   CSW was able to meet with patient briefly at [facility] [place] where patient currently resides to receive short-term rehabilitative services, today to perform a routine visit.  Patient was in the bathroom when CSW arrived, where patient remained for the majority of the visit.  Patient did not report feeling ill or experiencing an upset stomach. CSW was able to converse with patient through closed doors, but did not feel comfortable.  When patient did not appear to be coming out of the bathroom anytime in the near future, CSW agreed to follow-up with patient at a later time.  Patient denied having any urgent needs, bathroom related, nor did patient report needing any social work specific needs at present.  CSW agreed to follow-up with patient in two weeks (December 28th) at [place] to discuss discharge planning needs and services. Nat Christen, BSW, MSW, LCSW  Licensed Education officer, environmental Health System  Mailing [place] [address], [place], [place] [address] Physical Address-[address], [place], [place] [address] Toll Free Main # [phone] Fax # [phone] Cell # [phone]  Fax # [phone]  [email or web address]

## 2016-10-08 DIAGNOSIS — K52831 Collagenous colitis: Secondary | ICD-10-CM | POA: Diagnosis not present

## 2016-10-09 DIAGNOSIS — I10 Essential (primary) hypertension: Secondary | ICD-10-CM | POA: Diagnosis not present

## 2016-10-09 DIAGNOSIS — M791 Myalgia: Secondary | ICD-10-CM | POA: Diagnosis not present

## 2016-10-09 DIAGNOSIS — R198 Other specified symptoms and signs involving the digestive system and abdomen: Secondary | ICD-10-CM | POA: Diagnosis not present

## 2016-10-11 DIAGNOSIS — M069 Rheumatoid arthritis, unspecified: Secondary | ICD-10-CM | POA: Diagnosis not present

## 2016-10-11 DIAGNOSIS — M150 Primary generalized (osteo)arthritis: Secondary | ICD-10-CM | POA: Diagnosis not present

## 2016-10-11 DIAGNOSIS — K52831 Collagenous colitis: Secondary | ICD-10-CM | POA: Diagnosis not present

## 2016-10-16 DIAGNOSIS — I10 Essential (primary) hypertension: Secondary | ICD-10-CM | POA: Diagnosis not present

## 2016-10-16 DIAGNOSIS — E876 Hypokalemia: Secondary | ICD-10-CM | POA: Diagnosis not present

## 2016-10-16 DIAGNOSIS — I5022 Chronic systolic (congestive) heart failure: Secondary | ICD-10-CM | POA: Diagnosis not present

## 2016-10-16 DIAGNOSIS — R531 Weakness: Secondary | ICD-10-CM | POA: Diagnosis not present

## 2016-10-17 ENCOUNTER — Other Ambulatory Visit: Payer: Self-pay | Admitting: *Deleted

## 2016-10-17 DIAGNOSIS — F419 Anxiety disorder, unspecified: Secondary | ICD-10-CM | POA: Diagnosis not present

## 2016-10-17 DIAGNOSIS — F332 Major depressive disorder, recurrent severe without psychotic features: Secondary | ICD-10-CM | POA: Diagnosis not present

## 2016-10-17 DIAGNOSIS — F4321 Adjustment disorder with depressed mood: Secondary | ICD-10-CM | POA: Diagnosis not present

## 2016-10-17 NOTE — Patient Outreach (Signed)
[place] [facility]) Care Management  10/17/2016  Olivia Mullen 1936/08/08 [id number]   CSW was able to meet with patient briefly at [facility] [place] where patient currently resides to receive short-term rehabilitative services, today to perform a routine visit.  Patient did not appear to be in good spirits today, not wanting to work with therapies (both physical and occupational), not wanting to get out of bed, not wanting to be consulted by psychiatry, etc.  CSW spoke at length with patient's attending nurse at [facility] who reports that patient is very slow to progress with therapies.  Olivia Mullen further reported that patient does not appear to be motivated to regain her strength and mobility, with very little endurance and stamina.  Patient does not have a tentative discharge date scheduled at this time, possibly requiring long-term care services in a skilled facility. Patient will undergo additional labs and psychological testing to assess her current state of mind and to rule out any new medical conditions.  CSW will plan to meet with patient for the next routine visit on January 11th at [place], to assess and assist with discharge planning needs and services. Nat Christen, BSW, MSW, LCSW  Licensed Education officer, environmental Health System  Mailing [place] [address], [place], [place] [address] Physical Address-[address], [place], [place] [address] Toll Free Main # [phone] Fax # [phone] Cell # [phone]  Fax # [phone]  [email or web address]

## 2016-10-18 DIAGNOSIS — R198 Other specified symptoms and signs involving the digestive system and abdomen: Secondary | ICD-10-CM | POA: Diagnosis not present

## 2016-10-18 DIAGNOSIS — E876 Hypokalemia: Secondary | ICD-10-CM | POA: Diagnosis not present

## 2016-10-25 DIAGNOSIS — E876 Hypokalemia: Secondary | ICD-10-CM | POA: Diagnosis not present

## 2016-10-25 DIAGNOSIS — R4189 Other symptoms and signs involving cognitive functions and awareness: Secondary | ICD-10-CM | POA: Diagnosis not present

## 2016-10-25 DIAGNOSIS — A048 Other specified bacterial intestinal infections: Secondary | ICD-10-CM | POA: Diagnosis not present

## 2016-10-25 DIAGNOSIS — F332 Major depressive disorder, recurrent severe without psychotic features: Secondary | ICD-10-CM | POA: Diagnosis not present

## 2016-10-31 ENCOUNTER — Other Ambulatory Visit: Payer: Self-pay | Admitting: *Deleted

## 2016-10-31 NOTE — Patient Outreach (Signed)
[facility]) Care Management  10/31/2016  Olivia Mullen December 25, 1935 [id number]   CSW was able to meet with patient today at [facility] [place] where patient currently resides to receive short-term rehabilitative services.  Patient was eating her breakfast at the time of CSW's arrival.  Patient admits that she is anxious to return home and resuming home health services through [facility].  CSW spoke with patient about the fact that she has limited support in the home and a significant history of falls.  CSW also spoke with patient about the possibility of going into a long-term care assisted living facility; however, patient was not the least bit interested.  CSW agreed to continue to follow patient while at [facility] and assist with resuming services through [facility], once patient has a tentative discharge date scheduled.  CSW will notify [place]ula S[place] with [facility] of patient's discharge.  CSW will plan to meet with patient for the next routine visit on Thursday, January 25th. Nat Christen, BSW, MSW, LCSW  Licensed Education officer, environmental Health System  Mailing [place] [address], [place], [place] [address] Physical Address-[address], [place], [place] [address] Toll Free Main # [phone] Fax # [phone] Cell # [phone]  Fax # [phone]  [email or web address]

## 2016-11-06 ENCOUNTER — Inpatient Hospital Stay (HOSPITAL_COMMUNITY)
Admission: EM | Admit: 2016-11-06 | Discharge: 2016-11-21 | DRG: 871 | Disposition: E | Payer: Medicare Other | Attending: Internal Medicine | Admitting: Internal Medicine

## 2016-11-06 ENCOUNTER — Emergency Department (HOSPITAL_COMMUNITY): Payer: Medicare Other

## 2016-11-06 ENCOUNTER — Encounter (HOSPITAL_COMMUNITY): Payer: Self-pay | Admitting: Emergency Medicine

## 2016-11-06 DIAGNOSIS — I639 Cerebral infarction, unspecified: Secondary | ICD-10-CM | POA: Diagnosis not present

## 2016-11-06 DIAGNOSIS — E870 Hyperosmolality and hypernatremia: Secondary | ICD-10-CM | POA: Diagnosis present

## 2016-11-06 DIAGNOSIS — E272 Addisonian crisis: Secondary | ICD-10-CM | POA: Diagnosis not present

## 2016-11-06 DIAGNOSIS — A419 Sepsis, unspecified organism: Secondary | ICD-10-CM

## 2016-11-06 DIAGNOSIS — E876 Hypokalemia: Secondary | ICD-10-CM | POA: Diagnosis not present

## 2016-11-06 DIAGNOSIS — E43 Unspecified severe protein-calorie malnutrition: Secondary | ICD-10-CM | POA: Diagnosis not present

## 2016-11-06 DIAGNOSIS — R680 Hypothermia, not associated with low environmental temperature: Secondary | ICD-10-CM | POA: Diagnosis present

## 2016-11-06 DIAGNOSIS — N390 Urinary tract infection, site not specified: Secondary | ICD-10-CM | POA: Diagnosis not present

## 2016-11-06 DIAGNOSIS — I5042 Chronic combined systolic (congestive) and diastolic (congestive) heart failure: Secondary | ICD-10-CM | POA: Diagnosis present

## 2016-11-06 DIAGNOSIS — D696 Thrombocytopenia, unspecified: Secondary | ICD-10-CM | POA: Diagnosis present

## 2016-11-06 DIAGNOSIS — A048 Other specified bacterial intestinal infections: Secondary | ICD-10-CM | POA: Diagnosis not present

## 2016-11-06 DIAGNOSIS — E2740 Unspecified adrenocortical insufficiency: Secondary | ICD-10-CM | POA: Diagnosis present

## 2016-11-06 DIAGNOSIS — G9340 Encephalopathy, unspecified: Secondary | ICD-10-CM

## 2016-11-06 DIAGNOSIS — Z87891 Personal history of nicotine dependence: Secondary | ICD-10-CM | POA: Diagnosis not present

## 2016-11-06 DIAGNOSIS — E039 Hypothyroidism, unspecified: Secondary | ICD-10-CM | POA: Diagnosis present

## 2016-11-06 DIAGNOSIS — E878 Other disorders of electrolyte and fluid balance, not elsewhere classified: Secondary | ICD-10-CM | POA: Diagnosis not present

## 2016-11-06 DIAGNOSIS — R4182 Altered mental status, unspecified: Secondary | ICD-10-CM | POA: Diagnosis present

## 2016-11-06 DIAGNOSIS — G2581 Restless legs syndrome: Secondary | ICD-10-CM | POA: Diagnosis present

## 2016-11-06 DIAGNOSIS — R197 Diarrhea, unspecified: Secondary | ICD-10-CM | POA: Diagnosis present

## 2016-11-06 DIAGNOSIS — I110 Hypertensive heart disease with heart failure: Secondary | ICD-10-CM | POA: Diagnosis present

## 2016-11-06 DIAGNOSIS — Z9841 Cataract extraction status, right eye: Secondary | ICD-10-CM

## 2016-11-06 DIAGNOSIS — Z8249 Family history of ischemic heart disease and other diseases of the circulatory system: Secondary | ICD-10-CM

## 2016-11-06 DIAGNOSIS — R6520 Severe sepsis without septic shock: Secondary | ICD-10-CM | POA: Diagnosis not present

## 2016-11-06 DIAGNOSIS — M069 Rheumatoid arthritis, unspecified: Secondary | ICD-10-CM | POA: Diagnosis present

## 2016-11-06 DIAGNOSIS — J189 Pneumonia, unspecified organism: Secondary | ICD-10-CM | POA: Diagnosis present

## 2016-11-06 DIAGNOSIS — A0472 Enterocolitis due to Clostridium difficile, not specified as recurrent: Secondary | ICD-10-CM

## 2016-11-06 DIAGNOSIS — R402441 Other coma, without documented Glasgow coma scale score, or with partial score reported, in the field [EMT or ambulance]: Secondary | ICD-10-CM | POA: Diagnosis not present

## 2016-11-06 DIAGNOSIS — R6521 Severe sepsis with septic shock: Secondary | ICD-10-CM | POA: Diagnosis present

## 2016-11-06 DIAGNOSIS — Y95 Nosocomial condition: Secondary | ICD-10-CM | POA: Diagnosis not present

## 2016-11-06 DIAGNOSIS — I447 Left bundle-branch block, unspecified: Secondary | ICD-10-CM | POA: Diagnosis present

## 2016-11-06 DIAGNOSIS — I509 Heart failure, unspecified: Secondary | ICD-10-CM

## 2016-11-06 DIAGNOSIS — G9341 Metabolic encephalopathy: Secondary | ICD-10-CM | POA: Diagnosis not present

## 2016-11-06 DIAGNOSIS — I959 Hypotension, unspecified: Secondary | ICD-10-CM | POA: Diagnosis not present

## 2016-11-06 DIAGNOSIS — N179 Acute kidney failure, unspecified: Secondary | ICD-10-CM | POA: Diagnosis present

## 2016-11-06 DIAGNOSIS — Z452 Encounter for adjustment and management of vascular access device: Secondary | ICD-10-CM | POA: Diagnosis not present

## 2016-11-06 DIAGNOSIS — R0989 Other specified symptoms and signs involving the circulatory and respiratory systems: Secondary | ICD-10-CM | POA: Diagnosis not present

## 2016-11-06 DIAGNOSIS — Z4659 Encounter for fitting and adjustment of other gastrointestinal appliance and device: Secondary | ICD-10-CM

## 2016-11-06 DIAGNOSIS — N170 Acute kidney failure with tubular necrosis: Secondary | ICD-10-CM | POA: Diagnosis present

## 2016-11-06 DIAGNOSIS — E860 Dehydration: Secondary | ICD-10-CM | POA: Diagnosis present

## 2016-11-06 DIAGNOSIS — R64 Cachexia: Secondary | ICD-10-CM | POA: Diagnosis present

## 2016-11-06 DIAGNOSIS — Z90710 Acquired absence of both cervix and uterus: Secondary | ICD-10-CM

## 2016-11-06 DIAGNOSIS — F419 Anxiety disorder, unspecified: Secondary | ICD-10-CM | POA: Diagnosis present

## 2016-11-06 DIAGNOSIS — B952 Enterococcus as the cause of diseases classified elsewhere: Secondary | ICD-10-CM | POA: Diagnosis not present

## 2016-11-06 DIAGNOSIS — Z66 Do not resuscitate: Secondary | ICD-10-CM | POA: Diagnosis present

## 2016-11-06 DIAGNOSIS — R531 Weakness: Secondary | ICD-10-CM | POA: Diagnosis not present

## 2016-11-06 DIAGNOSIS — D496 Neoplasm of unspecified behavior of brain: Secondary | ICD-10-CM

## 2016-11-06 DIAGNOSIS — Z6820 Body mass index (BMI) 20.0-20.9, adult: Secondary | ICD-10-CM

## 2016-11-06 DIAGNOSIS — Z515 Encounter for palliative care: Secondary | ICD-10-CM | POA: Diagnosis not present

## 2016-11-06 DIAGNOSIS — K52831 Collagenous colitis: Secondary | ICD-10-CM | POA: Diagnosis present

## 2016-11-06 DIAGNOSIS — Z9049 Acquired absence of other specified parts of digestive tract: Secondary | ICD-10-CM

## 2016-11-06 DIAGNOSIS — E872 Acidosis: Secondary | ICD-10-CM | POA: Diagnosis present

## 2016-11-06 DIAGNOSIS — I10 Essential (primary) hypertension: Secondary | ICD-10-CM | POA: Diagnosis present

## 2016-11-06 DIAGNOSIS — F329 Major depressive disorder, single episode, unspecified: Secondary | ICD-10-CM | POA: Diagnosis present

## 2016-11-06 DIAGNOSIS — G43909 Migraine, unspecified, not intractable, without status migrainosus: Secondary | ICD-10-CM | POA: Diagnosis present

## 2016-11-06 DIAGNOSIS — Z96641 Presence of right artificial hip joint: Secondary | ICD-10-CM | POA: Diagnosis present

## 2016-11-06 DIAGNOSIS — T17908A Unspecified foreign body in respiratory tract, part unspecified causing other injury, initial encounter: Secondary | ICD-10-CM

## 2016-11-06 DIAGNOSIS — Z9842 Cataract extraction status, left eye: Secondary | ICD-10-CM

## 2016-11-06 DIAGNOSIS — Z853 Personal history of malignant neoplasm of breast: Secondary | ICD-10-CM

## 2016-11-06 DIAGNOSIS — I469 Cardiac arrest, cause unspecified: Secondary | ICD-10-CM | POA: Diagnosis not present

## 2016-11-06 DIAGNOSIS — Z9011 Acquired absence of right breast and nipple: Secondary | ICD-10-CM

## 2016-11-06 DIAGNOSIS — Z87442 Personal history of urinary calculi: Secondary | ICD-10-CM

## 2016-11-06 DIAGNOSIS — Z888 Allergy status to other drugs, medicaments and biological substances status: Secondary | ICD-10-CM

## 2016-11-06 DIAGNOSIS — L409 Psoriasis, unspecified: Secondary | ICD-10-CM | POA: Diagnosis present

## 2016-11-06 DIAGNOSIS — D649 Anemia, unspecified: Secondary | ICD-10-CM | POA: Diagnosis present

## 2016-11-06 DIAGNOSIS — E1165 Type 2 diabetes mellitus with hyperglycemia: Secondary | ICD-10-CM | POA: Diagnosis present

## 2016-11-06 LAB — CBC WITH DIFFERENTIAL/PLATELET
BASOS ABS: 0 10*3/uL (ref 0.0–0.1)
Basophils Relative: 0 %
EOS PCT: 0 %
Eosinophils Absolute: 0 10*3/uL (ref 0.0–0.7)
HEMATOCRIT: 29.2 % — AB (ref 36.0–46.0)
Hemoglobin: 9.6 g/dL — ABNORMAL LOW (ref 12.0–15.0)
Lymphocytes Relative: 7 %
Lymphs Abs: 1.2 10*3/uL (ref 0.7–4.0)
MCH: 28.2 pg (ref 26.0–34.0)
MCHC: 32.9 g/dL (ref 30.0–36.0)
MCV: 85.6 fL (ref 78.0–100.0)
MONO ABS: 1.2 10*3/uL — AB (ref 0.1–1.0)
MONOS PCT: 7 %
NEUTROS PCT: 86 %
Neutro Abs: 14.2 10*3/uL — ABNORMAL HIGH (ref 1.7–7.7)
Platelets: 166 10*3/uL (ref 150–400)
RBC: 3.41 MIL/uL — AB (ref 3.87–5.11)
RDW: 18.2 % — AB (ref 11.5–15.5)
WBC Morphology: INCREASED
WBC: 16.6 10*3/uL — AB (ref 4.0–10.5)

## 2016-11-06 LAB — COMPREHENSIVE METABOLIC PANEL
ALT: 9 U/L — ABNORMAL LOW (ref 14–54)
AST: 23 U/L (ref 15–41)
Albumin: 2.2 g/dL — ABNORMAL LOW (ref 3.5–5.0)
Alkaline Phosphatase: 81 U/L (ref 38–126)
BUN: 50 mg/dL — ABNORMAL HIGH (ref 6–20)
CHLORIDE: 124 mmol/L — AB (ref 101–111)
CO2: <7 mmol/L — ABNORMAL LOW (ref 22–32)
CREATININE: 4.42 mg/dL — AB (ref 0.44–1.00)
Calcium: 8 mg/dL — ABNORMAL LOW (ref 8.9–10.3)
GFR, EST AFRICAN AMERICAN: 10 mL/min — AB (ref >60)
GFR, EST NON AFRICAN AMERICAN: 9 mL/min — AB (ref >60)
Glucose, Bld: 155 mg/dL — ABNORMAL HIGH (ref 65–99)
POTASSIUM: 3.9 mmol/L (ref 3.5–5.1)
Sodium: 142 mmol/L (ref 135–145)
Total Bilirubin: 0.4 mg/dL (ref 0.3–1.2)
Total Protein: 4.7 g/dL — ABNORMAL LOW (ref 6.5–8.1)

## 2016-11-06 LAB — URINALYSIS, ROUTINE W REFLEX MICROSCOPIC
BILIRUBIN URINE: NEGATIVE
Glucose, UA: NEGATIVE mg/dL
Ketones, ur: NEGATIVE mg/dL
NITRITE: NEGATIVE
PH: 5 (ref 5.0–8.0)
Protein, ur: 100 mg/dL — AB
SPECIFIC GRAVITY, URINE: 1.013 (ref 1.005–1.030)

## 2016-11-06 LAB — I-STAT TROPONIN, ED: Troponin i, poc: 0.02 ng/mL (ref 0.00–0.08)

## 2016-11-06 LAB — CBC
HEMATOCRIT: 33 % — AB (ref 36.0–46.0)
HEMOGLOBIN: 11 g/dL — AB (ref 12.0–15.0)
MCH: 27.7 pg (ref 26.0–34.0)
MCHC: 33.3 g/dL (ref 30.0–36.0)
MCV: 83.1 fL (ref 78.0–100.0)
Platelets: 182 10*3/uL (ref 150–400)
RBC: 3.97 MIL/uL (ref 3.87–5.11)
RDW: 17.9 % — ABNORMAL HIGH (ref 11.5–15.5)
WBC: 14.4 10*3/uL — ABNORMAL HIGH (ref 4.0–10.5)

## 2016-11-06 LAB — APTT: aPTT: 33 seconds (ref 24–36)

## 2016-11-06 LAB — I-STAT CG4 LACTIC ACID, ED
LACTIC ACID, VENOUS: 1.98 mmol/L — AB (ref 0.5–1.9)
Lactic Acid, Venous: 4.96 mmol/L (ref 0.5–1.9)

## 2016-11-06 LAB — PROTIME-INR
INR: 1.26
Prothrombin Time: 15.9 seconds — ABNORMAL HIGH (ref 11.4–15.2)

## 2016-11-06 LAB — CREATININE, SERUM
Creatinine, Ser: 3.13 mg/dL — ABNORMAL HIGH (ref 0.44–1.00)
GFR calc Af Amer: 15 mL/min — ABNORMAL LOW (ref >60)
GFR calc non Af Amer: 13 mL/min — ABNORMAL LOW (ref >60)

## 2016-11-06 LAB — PROCALCITONIN
Procalcitonin: 0.42 ng/mL
Procalcitonin: 0.56 ng/mL

## 2016-11-06 LAB — TROPONIN I

## 2016-11-06 LAB — BRAIN NATRIURETIC PEPTIDE: B Natriuretic Peptide: 107.1 pg/mL — ABNORMAL HIGH (ref 0.0–100.0)

## 2016-11-06 LAB — INFLUENZA PANEL BY PCR (TYPE A & B)
INFLAPCR: NEGATIVE
INFLBPCR: NEGATIVE

## 2016-11-06 MED ORDER — HYDROCORTISONE NA SUCCINATE PF 100 MG IJ SOLR: 50.0000 mg | Freq: Four times a day (QID) | INTRAMUSCULAR | Status: DC

## 2016-11-06 MED ORDER — SODIUM CHLORIDE 0.9 % IV BOLUS (SEPSIS)
1000.0000 mL | Freq: Once | INTRAVENOUS | Status: AC
  Administered 2016-11-06: 1000 mL via INTRAVENOUS

## 2016-11-06 MED ORDER — PIPERACILLIN-TAZOBACTAM 3.375 G IVPB
3.3750 g | Freq: Two times a day (BID) | INTRAVENOUS | Status: DC
  Administered 2016-11-07: 3.375 g via INTRAVENOUS
  Filled 2016-11-06 (×2): qty 50

## 2016-11-06 MED ORDER — VANCOMYCIN 50 MG/ML ORAL SOLUTION
125.0000 mg | Freq: Four times a day (QID) | ORAL | Status: DC
  Administered 2016-11-06 – 2016-11-12 (×9): 125 mg via ORAL
  Filled 2016-11-06 (×4): qty 2.5
  Filled 2016-11-06: qty 1
  Filled 2016-11-06 (×4): qty 2.5
  Filled 2016-11-06: qty 1
  Filled 2016-11-06 (×14): qty 2.5

## 2016-11-06 MED ORDER — POTASSIUM CHLORIDE CRYS ER 20 MEQ PO TBCR
40.0000 meq | EXTENDED_RELEASE_TABLET | Freq: Two times a day (BID) | ORAL | Status: DC
  Filled 2016-11-06: qty 2

## 2016-11-06 MED ORDER — ONDANSETRON HCL 4 MG/2ML IJ SOLN: 4.0000 mg | Freq: Four times a day (QID) | INTRAMUSCULAR | Status: DC | PRN

## 2016-11-06 MED ORDER — SODIUM CHLORIDE 0.9 % IV SOLN
INTRAVENOUS | Status: DC
  Administered 2016-11-06 – 2016-11-07 (×2): via INTRAVENOUS

## 2016-11-06 MED ORDER — HEPARIN SODIUM (PORCINE) 5000 UNIT/ML IJ SOLN
5000.0000 [IU] | Freq: Three times a day (TID) | INTRAMUSCULAR | Status: DC
  Administered 2016-11-06 – 2016-11-10 (×11): 5000 [IU] via SUBCUTANEOUS
  Filled 2016-11-06 (×11): qty 1

## 2016-11-06 MED ORDER — HYDROCORTISONE NA SUCCINATE PF 100 MG IJ SOLR
100.0000 mg | INTRAMUSCULAR | Status: AC
Start: 2016-11-06 — End: 2016-11-06
  Administered 2016-11-06: 100 mg via INTRAVENOUS
  Filled 2016-11-06: qty 2

## 2016-11-06 MED ORDER — SODIUM CHLORIDE 0.9 % IV BOLUS (SEPSIS)
250.0000 mL | Freq: Once | INTRAVENOUS | Status: AC
  Administered 2016-11-06: 250 mL via INTRAVENOUS

## 2016-11-06 MED ORDER — ONDANSETRON HCL 4 MG PO TABS
4.0000 mg | ORAL_TABLET | Freq: Four times a day (QID) | ORAL | Status: DC | PRN
Start: 2016-11-06 — End: 2016-11-16

## 2016-11-06 MED ORDER — BUTALBITAL-APAP-CAFFEINE 50-325-40 MG PO TABS: 1.0000 | ORAL_TABLET | Freq: Two times a day (BID) | ORAL | Status: DC | PRN

## 2016-11-06 MED ORDER — SODIUM CHLORIDE 0.9% FLUSH
3.0000 mL | Freq: Two times a day (BID) | INTRAVENOUS | Status: DC
  Administered 2016-11-06 – 2016-11-15 (×17): 3 mL via INTRAVENOUS

## 2016-11-06 MED ORDER — BUDESONIDE 3 MG PO CPEP
9.0000 mg | ORAL_CAPSULE | Freq: Every day | ORAL | Status: DC
  Administered 2016-11-12: 9 mg via ORAL
  Administered 2016-11-13: 6 mg via ORAL
  Administered 2016-11-14 – 2016-11-15 (×2): 9 mg via ORAL
  Filled 2016-11-06 (×10): qty 3

## 2016-11-06 MED ORDER — SODIUM BICARBONATE 8.4 % IV SOLN
100.0000 meq | INTRAVENOUS | Status: AC
  Administered 2016-11-06: 100 meq via INTRAVENOUS
  Filled 2016-11-06: qty 100
  Filled 2016-11-06: qty 50

## 2016-11-06 MED ORDER — SUMATRIPTAN SUCCINATE 100 MG PO TABS
100.0000 mg | ORAL_TABLET | ORAL | Status: DC | PRN
  Filled 2016-11-06: qty 1

## 2016-11-06 MED ORDER — PIPERACILLIN-TAZOBACTAM 3.375 G IVPB 30 MIN
3.3750 g | Freq: Once | INTRAVENOUS | Status: AC
  Administered 2016-11-06: 3.375 g via INTRAVENOUS
  Filled 2016-11-06: qty 50

## 2016-11-06 MED ORDER — HYDROCORTISONE NA SUCCINATE PF 100 MG IJ SOLR
50.0000 mg | Freq: Four times a day (QID) | INTRAMUSCULAR | Status: DC
  Administered 2016-11-07 (×2): 50 mg via INTRAVENOUS
  Filled 2016-11-06 (×2): qty 2

## 2016-11-06 MED ORDER — VANCOMYCIN HCL IN DEXTROSE 1-5 GM/200ML-% IV SOLN
1000.0000 mg | Freq: Once | INTRAVENOUS | Status: AC
  Administered 2016-11-06: 1000 mg via INTRAVENOUS
  Filled 2016-11-06: qty 200

## 2016-11-06 NOTE — ED Notes (Signed)
Phlebotomy called to obtain labs and blood cultures.

## 2016-11-06 NOTE — ED Notes (Signed)
EKG given to Dr. Jacubowitz. 

## 2016-11-06 NOTE — ED Provider Notes (Signed)
[facility] DEPT Provider Note   CSN: [id number] Arrival date & time: 11/14/2016  1136     History   Chief Complaint Chief Complaint  Patient presents with  . Altered Mental Status    HPI Olivia Mullen is a 81 y.o. female.  The history is provided by the patient and medical records. No language interpreter was used.  Altered Mental Status     Olivia Mullen is a 81 y.o. female  with an extensive PMH listed below including recent treatment for C. Diff on oral vanc until 1/10 who presents to the Emergency Department from facility for altered mental status. Per nursing facility, yesterday patient was a little less talkative and did not eat or drink as much as usual, however was still alert and oriented 4 and took all medications. This morning, she was not responding to morning nursing staff, therefore EMS was called. Facility states that over the last several days, she has had decreased fluid and PO intake. She tested positive for C. difficile 2-3 weeks ago and was started on Flagyl. She completed a full course of Flagyl, but still no improvement. She was then placed on oral vancomycin and had 3 doses of this medication.  Level V caveat applies due to altered mental status.  Past Medical History:  Diagnosis Date  . Anemia   . Anginal pain ([place])    long time ago  . Anxiety   . Cancer ([place])    right breast, 20 years ago  . CHF (congestive heart failure) ([place])   . Chronic systolic heart failure ([place])   . Colitis, collagenous   . Complication of anesthesia "long time ago"   one time woke up during surgery   . Depression   . Headache(784.0)    migraines  . History of kidney stones long time ago  . Hypercholesteremia    "doctor wants to get down to 100, now its 108"  . Hypertension   . Hypothyroidism (acquired) 12/16/2011  . Multinodular goiter (nontoxic) 12/16/2011  . Normochromic normocytic anemia 12/16/2011  . Pneumonia    hx of, years ago  . Psoriasis   . Rheumatoid  arthritis(714.0) 12/16/2011  . UTI (lower urinary tract infection)     Patient Active Problem List   Diagnosis Date Noted  . Sepsis ([place]) 11/04/2016  . Protein-calorie malnutrition, severe 06/29/2016  . AKI (acute kidney injury) ([place]) 06/28/2016  . Diarrhea 06/28/2016  . Acute blood loss anemia 03/11/2016  . Gastric ulcer with hemorrhage 03/11/2016  . Hyperlipidemia 02/19/2015  . Postoperative anemia due to acute blood loss 10/12/2013  . Osteoarthritis of right hip 08/31/2013  . Depression   . Chronic systolic heart failure ([place])   . Hypertension 03/08/2012  . Breast cancer, stage 2 ([place]) 12/16/2011  . Rheumatoid arthritis(714.0) 12/16/2011  . Multinodular goiter (nontoxic) 12/16/2011  . Hypothyroidism (acquired) 12/16/2011  . DM type 2 (diabetes mellitus, type 2) ([place]) 12/16/2011  . Normochromic normocytic anemia 12/16/2011    Past Surgical History:  Procedure Laterality Date  . ABDOMINAL HYSTERECTOMY    . APPENDECTOMY    . BREAST SURGERY  20 years ago   reduced left breast, reconstruction  . CARPAL TUNNEL RELEASE Right   . CATARACT EXTRACTION Bilateral   . CHOLECYSTECTOMY    . ESOPHAGOGASTRODUODENOSCOPY (EGD) WITH PROPOFOL N/A 03/12/2016   Procedure: ESOPHAGOGASTRODUODENOSCOPY (EGD) WITH PROPOFOL;  Surgeon: [name], MD;  Location: [facility];  Service: Endoscopy;  Laterality: N/A;  . left shoulder     "replaced joint  with rod"  . MASTECTOMY     right  . SKIN GRAFT Right    , Calf  . THYROID SURGERY    . TONSILLECTOMY    . TOTAL HIP ARTHROPLASTY Right 10/11/2013   Procedure: RIGHT TOTAL HIP ARTHROPLASTY ANTERIOR APPROACH;  Surgeon: Gearlean Alf, MD;  Location: [facility];  Service: Orthopedics;  Laterality: Right;    OB History    No data available       Home Medications    Prior to Admission medications   Medication Sig Start Date End Date Taking? Authorizing Provider  butalbital-acetaminophen-caffeine (FIORICET, ESGIC) 50-325-40 MG tablet Take 1  tablet by mouth 2 (two) times daily as needed for headache.   Yes Historical Provider, MD  KLOR-CON M20 20 MEQ tablet Take 40 mEq by mouth 2 (two) times daily.  09/11/16  Yes Historical Provider, MD  loperamide (IMODIUM) 2 MG capsule Take 1 capsule (2 mg total) by mouth as needed for diarrhea or loose stools. 06/30/16  Yes Mir Marry Guan, MD  rizatriptan (MAXALT) 10 MG tablet Take 1 tablet by mouth every 2 (two) hours as needed for migraine. MAX 3 TABLETS/DAY 09/11/16  Yes Historical Provider, MD  sertraline (ZOLOFT) 50 MG tablet Take 50 mg by mouth every morning.   Yes Historical Provider, MD  acetaminophen (TYLENOL) 500 MG tablet Take 1,000 mg by mouth every 6 (six) hours as needed for moderate pain.    Historical Provider, MD  budesonide (ENTOCORT EC) 3 MG 24 hr capsule Take 3 capsules (9 mg total) by mouth daily. 06/30/16   Mir Marry Guan, MD  carvedilol (COREG) 6.25 MG tablet TAKE 1 TABLET (6.25 MG TOTAL) BY MOUTH 2 (TWO) TIMES DAILY WITH A MEAL. 03/08/16   Belva Crome, MD  cephALEXin (KEFLEX) 500 MG capsule Take 1 capsule (500 mg total) by mouth 2 (two) times daily. 09/14/16   Doristine Devoid, PA-C  cholecalciferol (VITAMIN D) 1000 UNITS tablet Take 2,000 Units by mouth daily.     Historical Provider, MD  clonazePAM (KLONOPIN) 0.5 MG tablet Take 1 mg by mouth at bedtime.     Historical Provider, MD  doxepin (SINEQUAN) 50 MG capsule Take 50 mg by mouth at bedtime.    Historical Provider, MD  feeding supplement, ENSURE ENLIVE, (ENSURE ENLIVE) LIQD Take 237 mLs by mouth 2 (two) times daily between meals. Patient not taking: Reported on 09/13/2016 06/30/16   Mir Marry Guan, MD  folic acid (FOLVITE) 1 MG tablet Take 1 mg by mouth daily.  01/17/14   Historical Provider, MD  furosemide (LASIX) 20 MG tablet Take 20 mg by mouth daily. 07/20/16   Historical Provider, MD  glycopyrrolate (ROBINUL) 2 MG tablet Take 2 mg by mouth 2 (two) times daily. 02/19/16   Historical Provider, MD   HUMIRA PEN 40 MG/0.8ML injection Inject 40 mg into the skin every 14 (fourteen) days.  01/22/14   Historical Provider, MD  leflunomide (ARAVA) 20 MG tablet Take 20 mg by mouth daily.    Historical Provider, MD  levothyroxine (SYNTHROID, LEVOTHROID) 75 MCG tablet Take 75 mcg by mouth daily before breakfast.    Historical Provider, MD  losartan (COZAAR) 25 MG tablet Take 25 mg by mouth daily. 08/28/16   Historical Provider, MD  Multiple Vitamins-Minerals (ADULT GUMMY PO) Take 2 tablets by mouth daily.    Historical Provider, MD  pantoprazole (PROTONIX) 40 MG tablet Take 1 tablet (40 mg total) by mouth 2 (two) times daily. Patient taking differently: Take 40  mg by mouth daily with breakfast.  03/16/16   Charlynne Cousins, MD  Probiotic Product (PROBIOTIC DAILY PO) Take 2 tablets by mouth daily.     Historical Provider, MD  simvastatin (ZOCOR) 10 MG tablet Take 10 mg by mouth daily.    Historical Provider, MD    Family History Family History  Problem Relation Age of Onset  . Heart disease Mother   . Healthy Sister     Social History Social History  Substance Use Topics  . Smoking status: Former Smoker    Packs/day: 3.00    Types: Cigarettes    Quit date: 10/21/1992  . Smokeless tobacco: Never Used  . Alcohol use Yes     Comment: occasional     Allergies   Gabapentin; Isosorbide nitrate; Wellbutrin [bupropion]; and Zonisamide   Review of Systems Review of Systems  Unable to perform ROS: Acuity of condition     Physical Exam Updated Vital Signs BP (!) 111/53   Pulse 72   Temp (!) 90.3 F (32.4 C) (Rectal)   Resp 24   Ht 5\' 4"  (1.626 m)   Wt 65.8 kg   SpO2 92%   BMI 24.89 kg/m   Physical Exam  Constitutional: She appears well-developed and well-nourished.  Ill appearing.   HENT:  Head: Normocephalic and atraumatic.  Dry cracked lips  Neck: Neck supple. No JVD present.  Cardiovascular: Normal rate, regular rhythm and normal heart sounds.   No murmur  heard. Pulmonary/Chest: Effort normal and breath sounds normal. No respiratory distress.  Abdominal: Soft. She exhibits no distension. There is no tenderness.  Neurological:  Nonverbal, unable to follow basic commands. Will move all four extremities and respond to painful stimuli.   Skin: Skin is dry.  Cool pale skin  Nursing note and vitals reviewed.    ED Treatments / Results  Labs (all labs ordered are listed, but only abnormal results are displayed) Labs Reviewed  COMPREHENSIVE METABOLIC PANEL - Abnormal; Notable for the following:       Result Value   Chloride 124 (*)    CO2 <7 (*)    Glucose, Bld 155 (*)    BUN 50 (*)    Creatinine, Ser 4.42 (*)    Calcium 8.0 (*)    Total Protein 4.7 (*)    Albumin 2.2 (*)    ALT 9 (*)    GFR calc non Af Amer 9 (*)    GFR calc Af Amer 10 (*)    All other components within normal limits  CBC WITH DIFFERENTIAL/PLATELET - Abnormal; Notable for the following:    WBC 16.6 (*)    RBC 3.41 (*)    Hemoglobin 9.6 (*)    HCT 29.2 (*)    RDW 18.2 (*)    Neutro Abs 14.2 (*)    Monocytes Absolute 1.2 (*)    All other components within normal limits  URINALYSIS, ROUTINE W REFLEX MICROSCOPIC - Abnormal; Notable for the following:    APPearance TURBID (*)    Hgb urine dipstick SMALL (*)    Protein, ur 100 (*)    Leukocytes, UA SMALL (*)    Bacteria, UA MANY (*)    Squamous Epithelial / LPF 6-30 (*)    All other components within normal limits  I-STAT CG4 LACTIC ACID, ED - Abnormal; Notable for the following:    Lactic Acid, Venous 4.96 (*)    All other components within normal limits  CULTURE, BLOOD (ROUTINE X 2)  CULTURE, BLOOD (  ROUTINE X 2)  URINE CULTURE  I-STAT CG4 LACTIC ACID, ED    EKG  EKG Interpretation  Date/Time:  Wednesday November 06 2016 11:47:53 EST Ventricular Rate:  69 PR Interval:    QRS Duration: 130 QT Interval:  533 QTC Calculation: 572 R Axis:   -60 Text Interpretation:  Sinus or ectopic atrial rhythm  Prolonged PR interval Left bundle branch block No significant change since last tracing Confirmed by Winfred Leeds  MD, SAM [phone]) on 11/07/2016 12:33:05 PM       Radiology Dg Chest Port 1 View  Result Date: 11/11/2016 CLINICAL DATA:  Altered mental status. EXAM: PORTABLE CHEST 1 VIEW COMPARISON:  09/14/2016. FINDINGS: The heart size and mediastinal contours are within normal limits. Aortic atherosclerosis noted. Both lungs are clear. Surgical clips noted in the right axilla. Previous left shoulder hemiarthroplasty. IMPRESSION: No active disease. Electronically Signed   By: Kerby Moors M.D.   On: 11/10/2016 12:14    Procedures Procedures (including critical care time)  CRITICAL CARE Performed by: Ozella Almond Ward   Total critical care time: 35 minutes  Critical care time was exclusive of separately billable procedures and treating other patients.  Critical care was necessary to treat or prevent imminent or life-threatening deterioration.  Critical care was time spent personally by me on the following activities: development of treatment plan with patient and/or surrogate as well as nursing, discussions with consultants, evaluation of patient's response to treatment, examination of patient, obtaining history from patient or surrogate, ordering and performing treatments and interventions, ordering and review of laboratory studies, ordering and review of radiographic studies, pulse oximetry and re-evaluation of patient's condition.   Medications Ordered in ED Medications  piperacillin-tazobactam (ZOSYN) IVPB 3.375 g (not administered)  sodium bicarbonate injection 100 mEq (not administered)  sodium chloride 0.9 % bolus 1,000 mL (0 mLs Intravenous Stopped 11/15/2016 1220)    And  sodium chloride 0.9 % bolus 1,000 mL (0 mLs Intravenous Stopped 11/15/2016 1233)    And  sodium chloride 0.9 % bolus 250 mL (250 mLs Intravenous New Bag/Given 11/17/2016 1335)  piperacillin-tazobactam (ZOSYN) IVPB  3.375 g (0 g Intravenous Stopped 11/18/2016 1240)  vancomycin (VANCOCIN) IVPB 1000 mg/200 mL premix (0 mg Intravenous Stopped 11/02/2016 1315)     Initial Impression / Assessment and Plan / ED Course  I have reviewed the triage vital signs and the nursing notes.  Pertinent labs & imaging results that were available during my care of the patient were reviewed by me and considered in my medical decision making (see chart for details).  Clinical Course    Olivia Mullen is a 81 y.o. female who presents to ED for altered mental status. DNR gold sheet at bedside. On arrival, patient is hypothermic at 80.9 and hypotensive 67/54.  Lactic of 4.96. Code sepsis activated. Blood and urine cultures obtained. Weight based fluids initiated. Vanc and zosyn started.   CXR negative.  White count of 16.6. UA shows small leuks, TNTC white cells, many bacteria.   Sepsis - Repeat Assessment  After 2L of warm saline and an hour and a half on bear hugger, temperature now 90.3.   Vitals     Blood pressure (!) 111/53, pulse 72, temperature (!) 90.3 F (32.4 C), temperature source Rectal, resp. rate 24, height 5\' 4"  (1.626 m), weight 65.8 kg, SpO2 92 %.  Heart:     Regular rate and rhythm  Lungs:    CTA  Peripheral Pulse:   Radial pulse palpable  Skin:  Pale - Pallor improving, but still pale.   Discussed patient with critical care who does not believe patient needs ICU admission. Hospitalist consulted who will admit.   Patient seen by and discussed with Dr. Winfred Leeds who agrees with treatment plan.    Final Clinical Impressions(s) / ED Diagnoses   Final diagnoses:  Sepsis, due to unspecified organism [facility])    New Prescriptions New Prescriptions   No medications on file     [facility] Ward, PA-C 10/22/2016 [place], MD 11/14/2016 [phone]

## 2016-11-06 NOTE — ED Notes (Signed)
Activated code sepsis  

## 2016-11-06 NOTE — ED Triage Notes (Signed)
Arrived via EMS from nursing home for altered mental status onset yesterday continued today. EMS reported BP 85/32, cold, and mottled toso. Unable to obtain pulse oximetry. CBG 122. Lethargic upon arrival response by calling name groaning. Doctor notified and arrived to bedside. Rectal temperature upon arrival 89.37F.

## 2016-11-06 NOTE — ED Notes (Signed)
Phlebotomy at bedside.

## 2016-11-06 NOTE — Progress Notes (Signed)
Pharmacy Antibiotic Note  Olivia Mullen is a 81 y.o. female admitted on 10/27/2016 with sepsis.   Plan: Vanc 1 g x 1 F/U renal fx Thu for further vanc Zosyn 3.375 gm iv q12h Monitor cx, vanc lvls prn   Height: 5\' 4"  (162.6 cm) Weight: 145 lb (65.8 kg) IBW/kg (Calculated) : 54.7  No data recorded.   Recent Labs Lab 11/11/2016 1240 10/23/2016 1252  WBC 16.6*  --   CREATININE 4.42*  --   LATICACIDVEN  --  4.96*    Estimated Creatinine Clearance: 9.8 mL/min (by C-G formula based on SCr of 4.42 mg/dL (H)).    Allergies  Allergen Reactions  . Gabapentin Itching  . Isosorbide Nitrate Itching  . Wellbutrin [Bupropion] Other (See Comments)    mental changes and increase anger  . Zonisamide Itching   Levester Fresh, PharmD, BCPS, BCCCP Clinical Pharmacist Clinical phone for 11/13/2016 from 7a-3:30p: [phone] If after 3:30p, please call main pharmacy at: [phone] 11/03/2016 1:35 PM

## 2016-11-06 NOTE — ED Provider Notes (Signed)
Level V caveat altered mental status. Patient found to be less responsive today at skilled nursing facility. Noted to be hypothermic. On exam patient is acutely and chronically ill-appearing. Cachectic appearing HEENT exam mucous memory is pale and dry neck supple lungs clear auscultation abdomen nondistended nontender all 4 extremities without redness swelling or tenderness skin without rash. No decubiti noticed. Neurologic opens mouth to command however does not follow other simple commands, nonverbal. Patient is DO NOT RESUSCITATE CODE STATUS. Code sepsis called. Chest x-ray viewed by me broad spectrum antibiotics ordered and warming 3 measures constituted withbaitr hugger   Orlie Dakin, MD 11/20/2016 1240

## 2016-11-06 NOTE — H&P (Signed)
History and Physical    Olivia Mullen [id number] DOB: 1936-07-02 DOA: 10/23/2016  PCP: [name], MD Patient coming from: SNF  Chief Complaint: AMS  HPI: Olivia Mullen is a 81 y.o. female with medical history significant of anxiety, breast cancer, CHF, depression, headaches, nephrolithiasis, hypertension, hypothyroidism, psoriasis, rheumatoid arthritis presenting from skilled nursing facility in an acutely altered state. Level V caveat applies as patient is unable to provide reliable history. History provided by nursing home report and EDP. Per report patient started becoming less talkative and interactive 1 day ago. Little to no oral intake over the last 24 hours. Remained oriented 4 until the morning of admission when patient was minimally responsive. Patient found to be hypothermic. Of note patient recently finished oral therapy for C. difficile on 10/30/2016 with Flagyl. Per report patient has had no improvement in her stools despite treatment so she was started on oral vancomycin 3 days ago.   ED Course: Patient septic and started on broad-spectrum antibiotics and IV fluids, warmed.  Review of Systems: As per HPI otherwise 10 point review of systems negative.   Ambulatory Status: unclear  Past Medical History:  Diagnosis Date  . Anemia   . Anginal pain ([place])    long time ago  . Anxiety   . Cancer ([place])    right breast, 20 years ago  . CHF (congestive heart failure) ([place])   . Chronic systolic heart failure ([place])   . Colitis, collagenous   . Complication of anesthesia "long time ago"   one time woke up during surgery   . Depression   . Headache(784.0)    migraines  . History of kidney stones long time ago  . Hypercholesteremia    "doctor wants to get down to 100, now its 108"  . Hypertension   . Hypothyroidism (acquired) 12/16/2011  . Multinodular goiter (nontoxic) 12/16/2011  . Normochromic normocytic anemia 12/16/2011  . Pneumonia    hx of, years ago  .  Psoriasis   . Rheumatoid arthritis(714.0) 12/16/2011  . UTI (lower urinary tract infection)     Past Surgical History:  Procedure Laterality Date  . ABDOMINAL HYSTERECTOMY    . APPENDECTOMY    . BREAST SURGERY  20 years ago   reduced left breast, reconstruction  . CARPAL TUNNEL RELEASE Right   . CATARACT EXTRACTION Bilateral   . CHOLECYSTECTOMY    . ESOPHAGOGASTRODUODENOSCOPY (EGD) WITH PROPOFOL N/A 03/12/2016   Procedure: ESOPHAGOGASTRODUODENOSCOPY (EGD) WITH PROPOFOL;  Surgeon: [name], MD;  Location: [facility];  Service: Endoscopy;  Laterality: N/A;  . left shoulder     "replaced joint with rod"  . MASTECTOMY     right  . SKIN GRAFT Right    , Calf  . THYROID SURGERY    . TONSILLECTOMY    . TOTAL HIP ARTHROPLASTY Right 10/11/2013   Procedure: RIGHT TOTAL HIP ARTHROPLASTY ANTERIOR APPROACH;  Surgeon: Gearlean Alf, MD;  Location: [facility];  Service: Orthopedics;  Laterality: Right;    Social History   Social History  . Marital status: Married    Spouse name: N/A  . Number of children: N/A  . Years of education: N/A   Occupational History  . Not on file.   Social History Main Topics  . Smoking status: Former Smoker    Packs/day: 3.00    Types: Cigarettes    Quit date: 10/21/1992  . Smokeless tobacco: Never Used  . Alcohol use Yes     Comment: occasional  . Drug  use: No  . Sexual activity: No   Other Topics Concern  . Not on file   Social History Narrative  . No narrative on file    Allergies  Allergen Reactions  . Gabapentin Itching  . Isosorbide Nitrate Itching  . Wellbutrin [Bupropion] Other (See Comments)    mental changes and increase anger  . Zonisamide Itching    Family History  Problem Relation Age of Onset  . Heart disease Mother   . Healthy Sister     Prior to Admission medications   Medication Sig Start Date End Date Taking? Authorizing Provider  acetaminophen (TYLENOL) 500 MG tablet Take 1,000 mg by mouth 3 (three) times  daily. TOTAL FROM ALL SOURCES OF ACETAMINOPHEN NOT TO EXCEED 4,000 MG IN 24 HOURS   Yes Historical Provider, MD  budesonide (ENTOCORT EC) 3 MG 24 hr capsule Take 3 capsules (9 mg total) by mouth daily. 06/30/16  Yes Mir Marry Guan, MD  butalbital-acetaminophen-caffeine (FIORICET, ESGIC) 50-325-40 MG tablet Take 1 tablet by mouth 2 (two) times daily as needed for headache.   Yes Historical Provider, MD  carvedilol (COREG) 6.25 MG tablet TAKE 1 TABLET (6.25 MG TOTAL) BY MOUTH 2 (TWO) TIMES DAILY WITH A MEAL. 03/08/16  Yes Belva Crome, MD  cholecalciferol (VITAMIN D) 1000 UNITS tablet Take 2,000 Units by mouth daily.    Yes Historical Provider, MD  clonazePAM (KLONOPIN) 0.5 MG tablet Take 0.5 mg by mouth at bedtime.    Yes Historical Provider, MD  doxepin (SINEQUAN) 100 MG capsule Take 100 mg by mouth at bedtime.   Yes Historical Provider, MD  folic acid (FOLVITE) 1 MG tablet Take 1 mg by mouth daily.  01/17/14  Yes Historical Provider, MD  furosemide (LASIX) 20 MG tablet Take 20 mg by mouth daily. 07/20/16  Yes Historical Provider, MD  glycopyrrolate (ROBINUL) 2 MG tablet Take 2 mg by mouth 2 (two) times daily. 02/19/16  Yes Historical Provider, MD  KLOR-CON M20 20 MEQ tablet Take 40 mEq by mouth 2 (two) times daily.  09/11/16  Yes Historical Provider, MD  lactose free nutrition (BOOST) LIQD Take 237 mLs by mouth 3 (three) times daily.   Yes Historical Provider, MD  leflunomide (ARAVA) 20 MG tablet Take 20 mg by mouth daily.   Yes Historical Provider, MD  levothyroxine (SYNTHROID, LEVOTHROID) 75 MCG tablet Take 75 mcg by mouth daily before breakfast.   Yes Historical Provider, MD  loperamide (IMODIUM) 2 MG capsule Take 1 capsule (2 mg total) by mouth as needed for diarrhea or loose stools. 06/30/16  Yes Mir Marry Guan, MD  losartan (COZAAR) 25 MG tablet Take 25 mg by mouth daily. 08/28/16  Yes Historical Provider, MD  pantoprazole (PROTONIX) 40 MG tablet Take 1 tablet (40 mg total) by mouth  2 (two) times daily. 03/16/16  Yes Charlynne Cousins, MD  Probiotic Product (PROBIOTIC DAILY PO) Take 2 capsules by mouth daily.    Yes Historical Provider, MD  rizatriptan (MAXALT) 10 MG tablet Take 1 tablet by mouth every 2 (two) hours as needed for migraine. MAX 3 TABLETS/DAY 09/11/16  Yes Historical Provider, MD  sertraline (ZOLOFT) 50 MG tablet Take 50 mg by mouth every morning.   Yes Historical Provider, MD  simvastatin (ZOCOR) 10 MG tablet Take 10 mg by mouth daily.   Yes Historical Provider, MD  vancomycin (VANCOCIN) 125 MG capsule Take 125 mg by mouth 4 (four) times daily. FOR 10 DAYS   Yes Historical Provider, MD  cephALEXin (  KEFLEX) 500 MG capsule Take 1 capsule (500 mg total) by mouth 2 (two) times daily. Patient not taking: Reported on 10/31/2016 09/14/16   Doristine Devoid, PA-C  feeding supplement, ENSURE ENLIVE, (ENSURE ENLIVE) LIQD Take 237 mLs by mouth 2 (two) times daily between meals. Patient not taking: Reported on 11/11/2016 06/30/16   Mir Marry Guan, MD  HUMIRA PEN 40 MG/0.8ML injection Inject 40 mg into the skin every 14 (fourteen) days.  01/22/14   Historical Provider, MD  Multiple Vitamins-Minerals (ADULT GUMMY PO) Take 2 tablets by mouth daily.    Historical Provider, MD    Physical Exam: Vitals:   11/07/2016 1515 11/08/2016 1600 11/02/2016 1649 11/05/2016 1740  BP: (!) 115/51 (!) 120/46 (!) 80/66 (!) 140/55  Pulse:   91   Resp: 25 (!) 28 (!) 28   Temp:   (!) 94.7 F (34.8 C)   TempSrc:   Rectal   SpO2:   100%   Weight:    52.3 kg (115 lb 4.8 oz)  Height:         General: Cachectic and severely ill appearing. Appears calm and comfortable Eyes:  PERRL, EOMI, normal lids, iris ENT: Dry mucous members, poor dentition Neck:  no LAD, masses or thyromegaly Cardiovascular: Difficult to appreciate cardiac sounds, regular rate and rhythm, 2/6 systolic murmur. No LE edema.  Respiratory: Labored breathing, clear to auscultation bilaterally, no wheezing Abdomen:   soft, ntnd, NABS Skin:  no rash or induration seen on limited exam Musculoskeletal:  grossly normal tone BUE/BLE, good ROM, no bony abnormality Psychiatric: Patient awake esters off blankly. Will track with eyes. Responds to painful stimuli. Does not follow commands. Neurologic:  CN 2-12 grossly intact, moves all extremities, sensation intact  Labs on Admission: I have personally reviewed following labs and imaging studies  CBC:  Recent Labs Lab 10/21/2016 1240  WBC 16.6*  NEUTROABS 14.2*  HGB 9.6*  HCT 29.2*  MCV 85.6  PLT [id number]   Basic Metabolic Panel:  Recent Labs Lab 10/30/2016 1240  NA 142  K 3.9  CL 124*  CO2 <7*  GLUCOSE 155*  BUN 50*  CREATININE 4.42*  CALCIUM 8.0*   GFR: Estimated Creatinine Clearance: 8.7 mL/min (by C-G formula based on SCr of 4.42 mg/dL (H)). Liver Function Tests:  Recent Labs Lab 11/15/2016 1240  AST 23  ALT 9*  ALKPHOS 81  BILITOT 0.4  PROT 4.7*  ALBUMIN 2.2*   No results for input(s): LIPASE, AMYLASE in the last 168 hours. No results for input(s): AMMONIA in the last 168 hours. Coagulation Profile: No results for input(s): INR, PROTIME in the last 168 hours. Cardiac Enzymes:  Recent Labs Lab 11/12/2016 1549  TROPONINI <0.03   BNP (last 3 results) No results for input(s): PROBNP in the last 8760 hours. HbA1C: No results for input(s): HGBA1C in the last 72 hours. CBG: No results for input(s): GLUCAP in the last 168 hours. Lipid Profile: No results for input(s): CHOL, HDL, LDLCALC, TRIG, CHOLHDL, LDLDIRECT in the last 72 hours. Thyroid Function Tests: No results for input(s): TSH, T4TOTAL, FREET4, T3FREE, THYROIDAB in the last 72 hours. Anemia Panel: No results for input(s): VITAMINB12, FOLATE, FERRITIN, TIBC, IRON, RETICCTPCT in the last 72 hours. Urine analysis:    Component Value Date/Time   COLORURINE YELLOW 10/31/2016 1209   APPEARANCEUR TURBID (A) 11/14/2016 1209   LABSPEC 1.013 11/07/2016 1209   PHURINE 5.0  11/03/2016 1209   GLUCOSEU NEGATIVE 11/04/2016 1209   HGBUR SMALL (A) 11/18/2016 1209  BILIRUBINUR NEGATIVE 11/19/2016 1209   KETONESUR NEGATIVE 11/01/2016 1209   PROTEINUR 100 (A) 11/15/2016 1209   UROBILINOGEN 0.2 10/05/2013 1209   NITRITE NEGATIVE 10/27/2016 1209   LEUKOCYTESUR SMALL (A) 11/14/2016 1209    Creatinine Clearance: Estimated Creatinine Clearance: 8.7 mL/min (by C-G formula based on SCr of 4.42 mg/dL (H)).  Sepsis Labs: @LABRCNTIP (procalcitonin:4,lacticidven:4) )No results found for this or any previous visit (from the past 240 hour(s)).   Radiological Exams on Admission: Dg Chest Port 1 View  Result Date: 11/17/2016 CLINICAL DATA:  Altered mental status. EXAM: PORTABLE CHEST 1 VIEW COMPARISON:  09/14/2016. FINDINGS: The heart size and mediastinal contours are within normal limits. Aortic atherosclerosis noted. Both lungs are clear. Surgical clips noted in the right axilla. Previous left shoulder hemiarthroplasty. IMPRESSION: No active disease. Electronically Signed   By: Kerby Moors M.D.   On: 10/24/2016 12:14    EKG: Independently reviewed. Sinus , LBBB, no ACS  Assessment/Plan Active Problems:   Rheumatoid arthritis (HCC)   Hypertension   AKI (acute kidney injury) ([place])   Diarrhea   Protein-calorie malnutrition, severe   Acute adrenal insufficiency (HCC)   C. difficile diarrhea   Sepsis: Suspect urologic etiology though urinary sample contaminated. Patient hypothermic with initial temp around 90, WBC 16.6 with left shift, acute kidney injury, lactic acid 4.96 and down trending to 1.96 after 2.5 L of warmed normal saline, BNP 107.1, troponin 0.02. Chest x-ray without evidence of acute process. - Sepsis protocol initiated by EDP - Continue vancomycin and Zosyn - Follow-up blood culture, urine culture - Patient is a DO NOT RESUSCITATE. Multiple attempts to reach family have been unsuccessful. - IVF  - stress dose steroids for pressure support (fluid bolus  already given w/ suboptimal response in pressure) - Procalcitonin, repeat CXR in am (after IVF) - Flu PCR  Social: difficult situation given pts mental status. Nearest reported family is a son in [place]. Multiple attempts made to contact son w/o success. If condition worsens will need to have palliative care consulted for GOC/Hospice/comfort care.    Protein calorie malnutrition - continue boost  Acute renal failure: Creatinine 4.42. Baseline 1.15. Likely dehydration and hypoperfusion related from sepsis/shock.  - IVF - BMET in am - further studies and renal consult if not improving  Diastolic CHF: No evidence of acute overload. Last Echo showing EF of [id number] and diastolic dysfunction (received 2.5 L fluid bolus in ED on admission.) - Resume Lasix 1 blood pressure and overall condition improves. - Strict I's and O's, daily weights  HTN: hypotensive from septic shock - hold lasix, coreg  Cdiff: Completed a prolonged course of Flagyl in the outpatient setting with little to no improvement in diarrhea so patient was started on oral vancomycin. Patient is currently on day 4 of a 10 day regimen of oral vancomycin - Continue vancomycin - Repeat C. difficile studies - hold home ppi and robinul  Depression/Anxiety/RLS: - when awake resume Doxepin, zoloft  RA: - continue budesonide  DVT prophylaxis: Hep  Code Status: DNR  Family Communication: none  Disposition Plan: pending improvement vs worsening condition and hospice  Consults called: CCM by EDP who deferred pt to medicine  Admission status: inpt    MERRELL, DAVID J MD [facility]  If 7PM-7AM, please contact night-coverage [email or web address] Password [facility]  11/01/2016, 5:45 PM

## 2016-11-07 ENCOUNTER — Other Ambulatory Visit: Payer: Self-pay | Admitting: *Deleted

## 2016-11-07 DIAGNOSIS — E876 Hypokalemia: Secondary | ICD-10-CM

## 2016-11-07 LAB — GLUCOSE, CAPILLARY
GLUCOSE-CAPILLARY: 119 mg/dL — AB (ref 65–99)
GLUCOSE-CAPILLARY: 156 mg/dL — AB (ref 65–99)
Glucose-Capillary: 31 mg/dL — CL (ref 65–99)
Glucose-Capillary: 226 mg/dL — ABNORMAL HIGH (ref 65–99)
Glucose-Capillary: 152 mg/dL — ABNORMAL HIGH (ref 65–99)

## 2016-11-07 LAB — POTASSIUM: Potassium: 3.5 mmol/L (ref 3.5–5.1)

## 2016-11-07 LAB — BASIC METABOLIC PANEL
Anion gap: 14 (ref 5–15)
BUN: 49 mg/dL — ABNORMAL HIGH (ref 6–20)
CO2: 10 mmol/L — ABNORMAL LOW (ref 22–32)
Calcium: 7.6 mg/dL — ABNORMAL LOW (ref 8.9–10.3)
Chloride: 126 mmol/L — ABNORMAL HIGH (ref 101–111)
Creatinine, Ser: 4.15 mg/dL — ABNORMAL HIGH (ref 0.44–1.00)
GFR calc Af Amer: 11 mL/min — ABNORMAL LOW (ref >60)
GFR calc non Af Amer: 9 mL/min — ABNORMAL LOW (ref >60)
Glucose, Bld: 78 mg/dL (ref 65–99)
Potassium: 2.7 mmol/L — CL (ref 3.5–5.1)
Sodium: 150 mmol/L — ABNORMAL HIGH (ref 135–145)

## 2016-11-07 LAB — CBC
HCT: 31.9 % — ABNORMAL LOW (ref 36.0–46.0)
HEMOGLOBIN: 10.8 g/dL — AB (ref 12.0–15.0)
MCH: 28.3 pg (ref 26.0–34.0)
MCHC: 33.9 g/dL (ref 30.0–36.0)
MCV: 83.5 fL (ref 78.0–100.0)
Platelets: 166 10*3/uL (ref 150–400)
RBC: 3.82 MIL/uL — AB (ref 3.87–5.11)
RDW: 18.3 % — ABNORMAL HIGH (ref 11.5–15.5)
WBC: 21.5 10*3/uL — ABNORMAL HIGH (ref 4.0–10.5)

## 2016-11-07 LAB — C DIFFICILE QUICK SCREEN W PCR REFLEX
C DIFFICILE (CDIFF) TOXIN: NEGATIVE
C DIFFICLE (CDIFF) ANTIGEN: NEGATIVE
C Diff interpretation: NOT DETECTED

## 2016-11-07 LAB — URINE CULTURE

## 2016-11-07 LAB — MAGNESIUM: Magnesium: 1.4 mg/dL — ABNORMAL LOW (ref 1.7–2.4)

## 2016-11-07 LAB — PHOSPHORUS: Phosphorus: 5.1 mg/dL — ABNORMAL HIGH (ref 2.5–4.6)

## 2016-11-07 LAB — MRSA PCR SCREENING: MRSA BY PCR: NEGATIVE

## 2016-11-07 MED ORDER — MAGNESIUM SULFATE 2 GM/50ML IV SOLN
2.0000 g | Freq: Once | INTRAVENOUS | Status: AC
  Administered 2016-11-07: 2 g via INTRAVENOUS
  Filled 2016-11-07: qty 50

## 2016-11-07 MED ORDER — KCL IN DEXTROSE-NACL 20-5-0.45 MEQ/L-%-% IV SOLN
INTRAVENOUS | Status: DC
  Administered 2016-11-07 – 2016-11-09 (×4): via INTRAVENOUS
  Filled 2016-11-07 (×6): qty 1000

## 2016-11-07 MED ORDER — PIPERACILLIN-TAZOBACTAM IN DEX 2-0.25 GM/50ML IV SOLN
2.2500 g | Freq: Three times a day (TID) | INTRAVENOUS | Status: DC
  Administered 2016-11-07 – 2016-11-12 (×15): 2.25 g via INTRAVENOUS
  Filled 2016-11-07 (×17): qty 50

## 2016-11-07 MED ORDER — SODIUM CHLORIDE 0.9 % IV SOLN
30.0000 meq | INTRAVENOUS | Status: AC
  Administered 2016-11-07 (×2): 30 meq via INTRAVENOUS
  Filled 2016-11-07 (×2): qty 15

## 2016-11-07 MED ORDER — HYDROCORTISONE NA SUCCINATE PF 100 MG IJ SOLR
50.0000 mg | Freq: Two times a day (BID) | INTRAMUSCULAR | Status: DC
  Administered 2016-11-08: 50 mg via INTRAVENOUS
  Filled 2016-11-07: qty 2

## 2016-11-07 MED ORDER — DEXTROSE 50 % IV SOLN
INTRAVENOUS | Status: AC
  Filled 2016-11-07: qty 50

## 2016-11-07 MED ORDER — DEXTROSE 50 % IV SOLN
1.0000 | Freq: Once | INTRAVENOUS | Status: AC
  Administered 2016-11-07: 50 mL via INTRAVENOUS

## 2016-11-07 NOTE — Progress Notes (Addendum)
CRITICAL VALUE ALERT  Critical value received:  Potassium 2.7  Date of notification:  11/07/16   Time of notification:  5:02 AM   Critical value read back:Yes.    Nurse who received alert:  Levonne Hubert   MD notified (1st page):  Baltazar Najjar, NP  Time of first page:  5:02 AM   MD notified (2nd page):  Time of second page:  Responding MD:  Tylene Fantasia  Time MD responded:  61mEq potassium given IVPB

## 2016-11-07 NOTE — Progress Notes (Signed)
PROGRESS NOTE    Olivia Mullen  [id number] DOB: [birth year] DOA: 11/15/2016 PCP: [name], MD  Brief Narrative:  81 y.o. female with medical history significant of anxiety, breast cancer, CHF, depression, headaches, nephrolithiasis, hypertension, hypothyroidism, psoriasis, rheumatoid arthritis presenting from skilled nursing facility in an acutely altered state.   Assessment & Plan:      Sepsis ([place]) - Lactic acidosis resolving. - BP improving - Continue Zosyn. Will obtain urine from foley for urine culture. Given that patient has had antibiotic therapy since admission do not think blood cultures would be useful at this point.   C. difficile diarrhea - Continue oral vancomycin. Per nursing patient is able to take this     Hypokalemia - Will administer K IV. Has been ordered earlier this AM by my associate. Per my discussion with nursing patient will have obtained 60 meq replacement. Will recheck potassium levels around 1800. - unable to safely take oral replacement as patient is somnolent per my discussion with nursing. As such will discontinue ordered K-Dur. Will place patient on maintenance IV fluids and add potassium  Hypomagnesemia - 1.4 on last check. Will replace IV and reassess next am.  Hypernatremia - Will place on MIVF's, most likely due to dehydration  Active Problems:   Rheumatoid arthritis ([place])   Hypertension - off antihypertensive medication with last reported BP at 137/44, will continue to monitor.    AKI (acute kidney injury) ([place]) - Most likely secondary to Sepsis although could be secondary to ATN secondary to either sepsis or hypotension. Another possibility is secondary to poor oral intake and prerenal causes.    Diarrhea - 1 bout reported today by daytime shift nurse, will place on MIVF's    Protein-calorie malnutrition, severe - Will place on nutritional supplementation once patient is able to take food orally    Acute adrenal  insufficiency ([place]) - will wean off solucortef. Change frequency from q 6 hours to q 12.   DVT prophylaxis: Heparin Code Status: DNR Family Communication: None at bedside Disposition Plan: pending improvement in condition   Consultants:   None   Procedures: None   Antimicrobials: Vancomycin, Zosyn   Subjective: Pt has no new complaints. No acute issues overnight.  Objective: Vitals:   11/07/16 0900 11/07/16 1000 11/07/16 1100 11/07/16 1208  BP: (!) 161/55 (!) 156/64 (!) 158/58 (!) 137/44  Pulse: 91 90 86 86  Resp: (!) 22 (!) 24 (!) 27 (!) 23  Temp:    98.2 F (36.8 C)  TempSrc:    Axillary  SpO2: 100% 100% 99% 100%  Weight:      Height:        Intake/Output Summary (Last 24 hours) at 11/07/16 1403 Last data filed at 11/07/16 0643  Gross per 24 hour  Intake           1207.5 ml  Output                0 ml  Net           1207.5 ml   Filed Weights   11/17/2016 1154 10/25/2016 1740 11/07/16 0500  Weight: 65.8 kg (145 lb) 52.3 kg (115 lb 4.8 oz) 54.3 kg (119 lb 11.4 oz)    Examination:  General exam: somnolent, in nad Respiratory system: equal chest rise, no wheezes Cardiovascular system: S1 & S2 heard, RRR. Marland Kitchen Gastrointestinal system: Abdomen is nondistended, soft and nontender. No organomegaly or masses felt. Normal bowel sounds heard. Central nervous system: difficult to assess  due to limited cooperation, no facial asymmetry Extremities: no cyanosis Skin: warm and dry Psychiatry: unable to assess due to limited cooperation.  Data Reviewed: I have personally reviewed following labs and imaging studies  CBC:  Recent Labs Lab 11/18/2016 1240 11/10/2016 1808 11/07/16 0548  WBC 16.6* 14.4* 21.5*  NEUTROABS 14.2*  --   --   HGB 9.6* 11.0* 10.8*  HCT 29.2* 33.0* 31.9*  MCV 85.6 83.1 83.5  PLT 166 182 [id number]   Basic Metabolic Panel:  Recent Labs Lab 11/19/2016 1240 11/19/2016 1808 11/07/16 0341 11/07/16 0609  NA 142  --  150*  --   K 3.9  --  2.7*  --   CL  124*  --  126*  --   CO2 <7*  --  10*  --   GLUCOSE 155*  --  78  --   BUN 50*  --  49*  --   CREATININE 4.42* 3.13* 4.15*  --   CALCIUM 8.0*  --  7.6*  --   MG  --   --   --  1.4*  PHOS  --   --   --  5.1*   GFR: Estimated Creatinine Clearance: 9.6 mL/min (by C-G formula based on SCr of 4.15 mg/dL (H)). Liver Function Tests:  Recent Labs Lab 11/09/2016 1240  AST 23  ALT 9*  ALKPHOS 81  BILITOT 0.4  PROT 4.7*  ALBUMIN 2.2*   No results for input(s): LIPASE, AMYLASE in the last 168 hours. No results for input(s): AMMONIA in the last 168 hours. Coagulation Profile:  Recent Labs Lab 11/17/2016 1808  INR 1.26   Cardiac Enzymes:  Recent Labs Lab 11/03/2016 1549  TROPONINI <0.03   BNP (last 3 results) No results for input(s): PROBNP in the last 8760 hours. HbA1C: No results for input(s): HGBA1C in the last 72 hours. CBG:  Recent Labs Lab 11/07/16 0824 11/07/16 0927 11/07/16 1207  GLUCAP 31* 226* 152*   Lipid Profile: No results for input(s): CHOL, HDL, LDLCALC, TRIG, CHOLHDL, LDLDIRECT in the last 72 hours. Thyroid Function Tests: No results for input(s): TSH, T4TOTAL, FREET4, T3FREE, THYROIDAB in the last 72 hours. Anemia Panel: No results for input(s): VITAMINB12, FOLATE, FERRITIN, TIBC, IRON, RETICCTPCT in the last 72 hours. Sepsis Labs:  Recent Labs Lab 11/12/2016 1252 10/31/2016 1549 10/27/2016 1559 10/26/2016 1808  PROCALCITON  --  0.42  --  0.56  LATICACIDVEN 4.96*  --  1.98*  --     Recent Results (from the past 240 hour(s))  Urine culture     Status: Abnormal   Collection Time: 11/12/2016 12:09 PM  Result Value Ref Range Status   Specimen Description URINE, RANDOM  Final   Special Requests NONE  Final   Culture MULTIPLE SPECIES PRESENT, SUGGEST RECOLLECTION (A)  Final   Report Status 11/07/2016 FINAL  Final  MRSA PCR Screening     Status: None   Collection Time: 11/08/2016  7:00 PM  Result Value Ref Range Status   MRSA by PCR NEGATIVE NEGATIVE Final      Comment:        The GeneXpert MRSA Assay (FDA approved for NASAL specimens only), is one component of a comprehensive MRSA colonization surveillance program. It is not intended to diagnose MRSA infection nor to guide or monitor treatment for MRSA infections.      Radiology Studies: Dg Chest Port 1 View  Result Date: 11/08/2016 CLINICAL DATA:  Altered mental status. EXAM: PORTABLE CHEST 1 VIEW COMPARISON:  09/14/2016.  FINDINGS: The heart size and mediastinal contours are within normal limits. Aortic atherosclerosis noted. Both lungs are clear. Surgical clips noted in the right axilla. Previous left shoulder hemiarthroplasty. IMPRESSION: No active disease. Electronically Signed   By: Kerby Moors M.D.   On: 11/17/2016 12:14   Scheduled Meds: . budesonide  9 mg Oral Daily  . heparin  5,000 Units Subcutaneous Q8H  . hydrocortisone sod succinate (SOLU-CORTEF) inj  50 mg Intravenous Q6H  . magnesium sulfate 1 - 4 g bolus IVPB  2 g Intravenous Once  . piperacillin-tazobactam (ZOSYN)  IV  2.25 g Intravenous Q8H  . potassium chloride SA  40 mEq Oral BID  . sodium chloride flush  3 mL Intravenous Q12H  . vancomycin  125 mg Oral QID   Continuous Infusions: . sodium chloride Stopped (11/07/16 1300)     LOS: 1 day    Time spent: > 35 minutes  Velvet Bathe, MD [facility] Pager [phone]  If 7PM-7AM, please contact night-coverage [email or web address] Password [email or web address] 11/07/2016, 2:03 PM

## 2016-11-07 NOTE — Patient Outreach (Signed)
[facility]) Care Management  11/07/2016  Olivia Mullen [birth year] [id number]   CSW received an [employer] in [place], indicating that patient has been readmitted into [facility] from [place] where patient was residing to receive short-term rehabilitative services, on Wednesday, January 17th.  Patient was transported to the Emergency Department via EMS (Emergency Medical Services), presenting with Altered Mental Status.  CSW will continue to follow patient while hospitalized and then plan to meet with patient at [facility] for the routine visit on Thursday, January 25th to assess and assist with discharge planning needs and services. Nat Christen, BSW, MSW, LCSW  Licensed Education officer, [facility]  Mailing [place] [address] [place], [place] [address] Physical Address-[address], [place],  [address] Toll Free Main # [phone] Fax # [phone] Cell # [phone]  Fax # [phone]  [email or web address]

## 2016-11-07 NOTE — Progress Notes (Signed)
CBG 31 Given 1 amp D50 IV

## 2016-11-08 DIAGNOSIS — A0472 Enterocolitis due to Clostridium difficile, not specified as recurrent: Secondary | ICD-10-CM

## 2016-11-08 LAB — CBC
HEMATOCRIT: 31.4 % — AB (ref 36.0–46.0)
HEMOGLOBIN: 10.4 g/dL — AB (ref 12.0–15.0)
MCH: 27.8 pg (ref 26.0–34.0)
MCHC: 33.1 g/dL (ref 30.0–36.0)
MCV: 84 fL (ref 78.0–100.0)
Platelets: 169 10*3/uL (ref 150–400)
RBC: 3.74 MIL/uL — ABNORMAL LOW (ref 3.87–5.11)
RDW: 18.7 % — AB (ref 11.5–15.5)
WBC: 31.7 10*3/uL — AB (ref 4.0–10.5)

## 2016-11-08 LAB — GLUCOSE, CAPILLARY
GLUCOSE-CAPILLARY: 129 mg/dL — AB (ref 65–99)
GLUCOSE-CAPILLARY: 187 mg/dL — AB (ref 65–99)
GLUCOSE-CAPILLARY: 113 mg/dL — AB (ref 65–99)
Glucose-Capillary: 118 mg/dL — ABNORMAL HIGH (ref 65–99)
Glucose-Capillary: 151 mg/dL — ABNORMAL HIGH (ref 65–99)
Glucose-Capillary: 93 mg/dL (ref 65–99)

## 2016-11-08 LAB — BASIC METABOLIC PANEL
Anion gap: 14 (ref 5–15)
BUN: 52 mg/dL — AB (ref 6–20)
CHLORIDE: 128 mmol/L — AB (ref 101–111)
CO2: 9 mmol/L — ABNORMAL LOW (ref 22–32)
CREATININE: 4.85 mg/dL — AB (ref 0.44–1.00)
Calcium: 7.6 mg/dL — ABNORMAL LOW (ref 8.9–10.3)
GFR, EST AFRICAN AMERICAN: 9 mL/min — AB (ref >60)
GFR, EST NON AFRICAN AMERICAN: 8 mL/min — AB (ref >60)
Glucose, Bld: 157 mg/dL — ABNORMAL HIGH (ref 65–99)
POTASSIUM: 3.1 mmol/L — AB (ref 3.5–5.1)
SODIUM: 151 mmol/L — AB (ref 135–145)

## 2016-11-08 LAB — PROCALCITONIN: PROCALCITONIN: 1.96 ng/mL

## 2016-11-08 LAB — MAGNESIUM: MAGNESIUM: 2.7 mg/dL — AB (ref 1.7–2.4)

## 2016-11-08 MED ORDER — CHLORHEXIDINE GLUCONATE 0.12 % MT SOLN
15.0000 mL | Freq: Two times a day (BID) | OROMUCOSAL | Status: DC
  Administered 2016-11-08 – 2016-11-15 (×16): 15 mL via OROMUCOSAL
  Filled 2016-11-08 (×12): qty 15

## 2016-11-08 MED ORDER — ORAL CARE MOUTH RINSE
15.0000 mL | Freq: Two times a day (BID) | OROMUCOSAL | Status: DC
  Administered 2016-11-08 – 2016-11-15 (×14): 15 mL via OROMUCOSAL

## 2016-11-08 MED ORDER — POTASSIUM CHLORIDE 2 MEQ/ML IV SOLN
30.0000 meq | Freq: Once | INTRAVENOUS | Status: AC
  Administered 2016-11-08: 30 meq via INTRAVENOUS
  Filled 2016-11-08: qty 15

## 2016-11-08 NOTE — Care Management Note (Signed)
Case Management Note  Patient Details  Name: ANUOLUWAPO VETSCH MRN: [id number] Date of Birth: 02-Jan-1936  Subjective/Objective:    Pt is from [facility] and will return to SNF when discharged.  CSW consulted.                           Expected Discharge Plan:  [place]  In-House Referral:  Clinical Social Work  Discharge planning Services  CM Consult  Status of Service:  Completed, signed off  Girard Cooter, [place] 11/08/2016, 8:33 AM

## 2016-11-08 NOTE — Clinical Social Work Note (Signed)
Clinical Social Work Assessment  Patient Details  Name: Olivia Mullen MRN: [id number] Date of Birth: [birth year]  Date of referral:  11/08/16               Reason for consult:  Facility Placement                Permission sought to share information with:  Facility Art therapist granted to share information::  Yes, Verbal Permission Granted  Name::     Teacher, music::  SNF  Relationship::  son  Contact Information:     Housing/Transportation Living arrangements for the past 2 months:  [place] of Information:  Adult Children Patient Interpreter Needed:  None Criminal Activity/Legal Involvement Pertinent to Current Situation/Hospitalization:  No - Comment as needed Significant Relationships:  Adult Children Lives with:  Facility Resident Do you feel safe going back to the place where you live?  No Need for family participation in patient care:  Yes (Comment) (son is POA)  Care giving concerns:  Pt has been at [facility] the last two months and was at [facility] before that.  Patient has high level of care needs and not able to return home per son.   Social Worker assessment / plan:  CSW spoke with patient son, Olivia Mullen, about plan for time of DC.  Olivia Mullen states that he does not want patient to return to [place] at time of DC- states that staff do not follow up with him when he expresses concerns and that patient is left in dirty diapers.  Olivia Mullen states that patient is out of Medicare days and that they have had to start paying privately at [place] for her to stay  They are looking into Medicaid but states that she still has her house and has $6000 in savings which would prevent her from getting Medicaid.  CSW encouraged them to talk to DSS to determine her eligibility since they would be unable to pay privately for SNF- son states he could maybe get $5000 for 1st month at new SNF if needed.  CSW explained that there might not be any  options for SNF because of how little they could pay and that Medicaid would be their best choice.  Stated that if [facility] is able to accept patient back that might be their only choice until they get her Medicaid.  Son remains very against return to [place] [place] reiterated that it might be the only option but that I would explore other possibilities.  CSW spoke with [place] who confirms they are out of Medicare days- they will take patient back since they haven't given 30day notice and will help with Medicaid.   Employment status:  Retired Forensic scientist:  Medicare PT Recommendations:  Not assessed at this time Information / Referral to community resources:  [place]  Patient/Family's Response to care:  Patient son agreeable to looking into other options and hopeful some other option can be found.  Patient/Family's Understanding of and Emotional Response to Diagnosis, Current Treatment, and Prognosis:  Very concerned with patients health and overwhelmed with figuring out her care at this time- also upset but the upcoming anniversary of his fathers death- states his father was the primary caregiver for the pt and his hero.  Emotional Assessment Appearance:  Appears stated age Attitude/Demeanor/Rapport:    Affect (typically observed):  Appropriate Orientation:    Alcohol / Substance use:  Not Applicable Psych involvement (Current and /or in the community):  No (  Comment)  Discharge Needs  Concerns to be addressed:  Care Coordination Readmission within the last 30 days:  No Current discharge risk:  Physical Impairment Barriers to Discharge:  Continued Medical Work up   Jorge Ny, LCSW 11/08/2016, 3:57 PM

## 2016-11-08 NOTE — Progress Notes (Signed)
PROGRESS NOTE    Olivia Mullen  [id number] DOB: 07/25/1936 DOA: 11/01/2016 PCP: [name], MD  Brief Narrative:  81 y.o. female with medical history significant of anxiety, breast cancer, CHF, depression, headaches, nephrolithiasis, hypertension, hypothyroidism, psoriasis, rheumatoid arthritis presenting from skilled nursing facility in an acutely altered state.   Assessment & Plan:      Sepsis ([place]) - Lactic acidosis resolving. - BP improving - Continue Zosyn. Will obtain urine from foley for urine culture. Given that patient has had antibiotic therapy since admission do not think blood cultures would be useful at this point. - Will increase IVF rehydration I suspect patient is still dry   C. difficile diarrhea - Continue oral vancomycin. Per nursing patient is able to take this     Hypokalemia - Will administer K IV. Has been ordered earlier this AM by my associate. Per my discussion with nursing patient will have obtained 60 meq replacement. Will recheck potassium levels around 1800. - unable to safely take oral replacement as patient is somnolent per my discussion with nursing. As such will discontinue ordered K-Dur.  - Continue MIVF's  Hypermagnesemia -  2.7 on last check. IVF rehydration - reassess next am.  Hypernatremia - Will place on MIVF's, most likely due to dehydration  Active Problems:   Rheumatoid arthritis ([place])   Hypertension - off antihypertensive medication with last reported BP at 137/44, will continue to monitor.    AKI (acute kidney injury) ([place]) - Most likely secondary to Sepsis although could be secondary to ATN secondary to either sepsis or hypotension. Another possibility is secondary to poor oral intake and prerenal causes.    Diarrhea - on MIVF's    Protein-calorie malnutrition, severe - Will place on nutritional supplementation once patient is able to take food orally    Acute adrenal insufficiency ([place]) - will d/c solucortef  in lieu of active infection. Stress dosing for blood pressure completed.  Chronic diastolic HF - I suspect patient is still dry. Will increase IVF   DVT prophylaxis: Heparin Code Status: DNR Family Communication: None at bedside Disposition Plan: pending improvement in condition   Consultants:   None   Procedures: None   Antimicrobials: Vancomycin, Zosyn   Subjective: Pt still somnolent.  Objective: Vitals:   11/08/16 1253 11/08/16 1300 11/08/16 1500 11/08/16 1600  BP: (!) 156/64 (!) 146/64 140/67 138/72  Pulse: 89 93 92 98  Resp: (!) 22 (!) 33 (!) 27 (!) 25  Temp: 97 F (36.1 C)   98.4 F (36.9 C)  TempSrc: Axillary   Axillary  SpO2: 100% 100% 100% 100%  Weight:      Height:        Intake/Output Summary (Last 24 hours) at 11/08/16 1658 Last data filed at 11/08/16 1500  Gross per 24 hour  Intake             1720 ml  Output                0 ml  Net             1720 ml   Filed Weights   11/11/2016 1740 11/07/16 0500 11/08/16 0500  Weight: 52.3 kg (115 lb 4.8 oz) 54.3 kg (119 lb 11.4 oz) 56.2 kg (123 lb 14.4 oz)    Examination:  General exam: somnolent, in nad Respiratory system: equal chest rise, no wheezes Cardiovascular system: S1 & S2 heard, RRR. Marland Kitchen Gastrointestinal system: Abdomen is nondistended, soft and nontender. No organomegaly or masses  felt. Normal bowel sounds heard. Central nervous system: difficult to assess due to limited cooperation, no facial asymmetry Extremities: no cyanosis Skin: warm and dry Psychiatry: unable to assess due to limited cooperation.  Data Reviewed: I have personally reviewed following labs and imaging studies  CBC:  Recent Labs Lab 11/09/2016 1240 10/26/2016 1808 11/07/16 0548 11/08/16 0504  WBC 16.6* 14.4* 21.5* 31.7*  NEUTROABS 14.2*  --   --   --   HGB 9.6* 11.0* 10.8* 10.4*  HCT 29.2* 33.0* 31.9* 31.4*  MCV 85.6 83.1 83.5 84.0  PLT 166 182 166 [id number]   Basic Metabolic Panel:  Recent Labs Lab 10/22/2016 1240  11/17/2016 1808 11/07/16 0341 11/07/16 0609 11/07/16 1835 11/08/16 0504  NA 142  --  150*  --   --  151*  K 3.9  --  2.7*  --  3.5 3.1*  CL 124*  --  126*  --   --  128*  CO2 <7*  --  10*  --   --  9*  GLUCOSE 155*  --  78  --   --  157*  BUN 50*  --  49*  --   --  52*  CREATININE 4.42* 3.13* 4.15*  --   --  4.85*  CALCIUM 8.0*  --  7.6*  --   --  7.6*  MG  --   --   --  1.4*  --  2.7*  PHOS  --   --   --  5.1*  --   --    GFR: Estimated Creatinine Clearance: 8.3 mL/min (by C-G formula based on SCr of 4.85 mg/dL (H)). Liver Function Tests:  Recent Labs Lab 11/15/2016 1240  AST 23  ALT 9*  ALKPHOS 81  BILITOT 0.4  PROT 4.7*  ALBUMIN 2.2*   No results for input(s): LIPASE, AMYLASE in the last 168 hours. No results for input(s): AMMONIA in the last 168 hours. Coagulation Profile:  Recent Labs Lab 10/21/2016 1808  INR 1.26   Cardiac Enzymes:  Recent Labs Lab 10/21/2016 1549  TROPONINI <0.03   BNP (last 3 results) No results for input(s): PROBNP in the last 8760 hours. HbA1C: No results for input(s): HGBA1C in the last 72 hours. CBG:  Recent Labs Lab 11/07/16 2317 11/08/16 0331 11/08/16 0747 11/08/16 1252 11/08/16 1646  GLUCAP 156* 129* 151* 187* 118*   Lipid Profile: No results for input(s): CHOL, HDL, LDLCALC, TRIG, CHOLHDL, LDLDIRECT in the last 72 hours. Thyroid Function Tests: No results for input(s): TSH, T4TOTAL, FREET4, T3FREE, THYROIDAB in the last 72 hours. Anemia Panel: No results for input(s): VITAMINB12, FOLATE, FERRITIN, TIBC, IRON, RETICCTPCT in the last 72 hours. Sepsis Labs:  Recent Labs Lab 10/23/2016 1252 11/18/2016 1549 11/05/2016 1559 10/22/2016 1808 11/08/16 0504  PROCALCITON  --  0.42  --  0.56 1.96  LATICACIDVEN 4.96*  --  1.98*  --   --     Recent Results (from the past 240 hour(s))  Urine culture     Status: Abnormal   Collection Time: 11/04/2016 12:09 PM  Result Value Ref Range Status   Specimen Description URINE, RANDOM  Final     Special Requests NONE  Final   Culture MULTIPLE SPECIES PRESENT, SUGGEST RECOLLECTION (A)  Final   Report Status 11/07/2016 FINAL  Final  Blood Culture (routine x 2)     Status: None (Preliminary result)   Collection Time: 11/12/2016 12:20 PM  Result Value Ref Range Status   Specimen Description BLOOD LEFT  ANTECUBITAL  Final   Special Requests BOTTLES DRAWN AEROBIC AND ANAEROBIC 5CC  Final   Culture NO GROWTH 2 DAYS  Final   Report Status PENDING  Incomplete  Blood Culture (routine x 2)     Status: None (Preliminary result)   Collection Time: 10/28/2016 12:40 PM  Result Value Ref Range Status   Specimen Description BLOOD LEFT HAND  Final   Special Requests BOTTLES DRAWN AEROBIC AND ANAEROBIC 5CC  Final   Culture NO GROWTH 2 DAYS  Final   Report Status PENDING  Incomplete  C difficile quick scan w PCR reflex     Status: None   Collection Time: 11/05/2016 12:43 PM  Result Value Ref Range Status   C Diff antigen NEGATIVE NEGATIVE Final   C Diff toxin NEGATIVE NEGATIVE Final   C Diff interpretation No C. difficile detected.  Final  MRSA PCR Screening     Status: None   Collection Time: 10/31/2016  7:00 PM  Result Value Ref Range Status   MRSA by PCR NEGATIVE NEGATIVE Final    Comment:        The GeneXpert MRSA Assay (FDA approved for NASAL specimens only), is one component of a comprehensive MRSA colonization surveillance program. It is not intended to diagnose MRSA infection nor to guide or monitor treatment for MRSA infections.      Radiology Studies: No results found. Scheduled Meds: . budesonide  9 mg Oral Daily  . chlorhexidine  15 mL Mouth Rinse BID  . heparin  5,000 Units Subcutaneous Q8H  . mouth rinse  15 mL Mouth Rinse q12n4p  . piperacillin-tazobactam (ZOSYN)  IV  2.25 g Intravenous Q8H  . sodium chloride flush  3 mL Intravenous Q12H  . vancomycin  125 mg Oral QID   Continuous Infusions: . dextrose 5 % and 0.45 % NaCl with KCl 20 mEq/L 75 mL/hr at 11/08/16 0944      LOS: 2 days    Time spent: > 35 minutes  Velvet Bathe, MD [facility] Pager [phone]  If 7PM-7AM, please contact night-coverage [email or web address] Password [facility] 11/08/2016, 4:58 PM

## 2016-11-08 NOTE — Evaluation (Signed)
Clinical/Bedside Swallow Evaluation Patient Details  Name: Olivia Mullen MRN: [id number] Date of Birth: 13-Feb-1936  Today's Date: 11/08/2016 Time: SLP Start Time (ACUTE ONLY): 65 SLP Stop Time (ACUTE ONLY): 1115 SLP Time Calculation (min) (ACUTE ONLY): 25 min  Past Medical History:  Past Medical History:  Diagnosis Date  . Anemia   . Anginal pain ([place])    long time ago  . Anxiety   . Cancer ([place])    right breast, 20 years ago  . CHF (congestive heart failure) ([place])   . Chronic systolic heart failure ([place])   . Colitis, collagenous   . Complication of anesthesia "long time ago"   one time woke up during surgery   . Depression   . Headache(784.0)    migraines  . History of kidney stones long time ago  . Hypercholesteremia    "doctor wants to get down to 100, now its 108"  . Hypertension   . Hypothyroidism (acquired) 12/16/2011  . Multinodular goiter (nontoxic) 12/16/2011  . Normochromic normocytic anemia 12/16/2011  . Pneumonia    hx of, years ago  . Psoriasis   . Rheumatoid arthritis(714.0) 12/16/2011  . UTI (lower urinary tract infection)    Past Surgical History:  Past Surgical History:  Procedure Laterality Date  . ABDOMINAL HYSTERECTOMY    . APPENDECTOMY    . BREAST SURGERY  20 years ago   reduced left breast, reconstruction  . CARPAL TUNNEL RELEASE Right   . CATARACT EXTRACTION Bilateral   . CHOLECYSTECTOMY    . ESOPHAGOGASTRODUODENOSCOPY (EGD) WITH PROPOFOL N/A 03/12/2016   Procedure: ESOPHAGOGASTRODUODENOSCOPY (EGD) WITH PROPOFOL;  Surgeon: [name], MD;  Location: [facility] ENDOSCOPY;  Service: Endoscopy;  Laterality: N/A;  . left shoulder     "replaced joint with rod"  . MASTECTOMY     right  . SKIN GRAFT Right    , Calf  . THYROID SURGERY    . TONSILLECTOMY    . TOTAL HIP ARTHROPLASTY Right 10/11/2013   Procedure: RIGHT TOTAL HIP ARTHROPLASTY ANTERIOR APPROACH;  Surgeon: Gearlean Alf, MD;  Location: [facility] ORS;  Service: Orthopedics;  Laterality: Right;    HPI:  81 y.o.femalewith medical history significant of anxiety, breast cancer, CHF, depression, headaches, nephrolithiasis, hypertension, hypothyroidism, psoriasis, rheumatoid arthritis presenting from skilled nursing facility in an acutely altered state. Per report patient started becoming less talkative and interactive 1 day ago. Little to no oral intake over the last 24 hours. Remained oriented 4 until the morning of admission when patient was minimally responsive. Patient found to be hypothermic. Of note patient recently finished oral therapy for C. difficile on 10/30/2016 with Flagyl. Suspected urologic etiology of sepsis.    Assessment / Plan / Recommendation Clinical Impression  Pt demosntrates a severe dysphagia due to AMS and severely dry oral mucosa. Pt awake but keeping eyes closed and moaning. Though mentation obviously not appopriate for diet, proceeded with assessment to provide oral care to advance pt for PO readiness. Pt noted to have dry bloody secretions on teeth, gums, tongue and soft palate. Pt with no ability to mobilize tongue and would not close mandible to bite swab or close lips on a wet spoon. Removed many dry secretions, but pt will need QID oral care to improved oral hygiene for PO. Pt verbalized 'uh huh!" when asked if she wanted an ice chip, but could not attempt oral manipulation despite max tactile assist for labial closure. Will continue to follow for PO readiness trials. May need short term means of  alternate nutrition.     Aspiration Risk  Severe aspiration risk;Risk for inadequate nutrition/hydration    Diet Recommendation NPO;Alternative means - temporary        Other  Recommendations Oral Care Recommendations: Oral care QID;Staff/trained caregiver to provide oral care Other Recommendations: Have oral suction available   Follow up Recommendations Skilled Nursing facility      Frequency and Duration min 3x week  2 weeks       Prognosis Prognosis for  Safe Diet Advancement: Good      Swallow Study   General HPI: 81 y.o.femalewith medical history significant of anxiety, breast cancer, CHF, depression, headaches, nephrolithiasis, hypertension, hypothyroidism, psoriasis, rheumatoid arthritis presenting from skilled nursing facility in an acutely altered state. Per report patient started becoming less talkative and interactive 1 day ago. Little to no oral intake over the last 24 hours. Remained oriented 4 until the morning of admission when patient was minimally responsive. Patient found to be hypothermic. Of note patient recently finished oral therapy for C. difficile on 10/30/2016 with Flagyl. Suspected urologic etiology of sepsis.  Type of Study: Bedside Swallow Evaluation Previous Swallow Assessment: none Diet Prior to this Study: NPO Temperature Spikes Noted: No Respiratory Status: Nasal cannula History of Recent Intubation: No Behavior/Cognition: Confused;Uncooperative;Lethargic/Drowsy;Doesn't follow directions Oral Cavity Assessment: Dry;Dried secretions Oral Care Completed by SLP: Yes Oral Cavity - Dentition: Adequate natural dentition Vision: Impaired for self-feeding Self-Feeding Abilities: Total assist Patient Positioning: Upright in bed Baseline Vocal Quality: Normal Volitional Cough: Cognitively unable to elicit Volitional Swallow: Unable to elicit    Oral/Motor/Sensory Function Overall Oral Motor/Sensory Function: Severe impairment Lingual ROM: Reduced right;Reduced left Lingual Strength: Reduced Lingual Sensation: Reduced Mandible: Impaired   Ice Chips Ice chips: Impaired Presentation: Spoon Oral Phase Impairments: Reduced labial seal;Reduced lingual movement/coordination;Poor awareness of bolus   Thin Liquid      Nectar Thick     Honey Thick     Puree     Solid   GO           Herbie Baltimore, MA CCC-SLP [phone]  [name], Katherene Ponto 11/08/2016,11:20 AM

## 2016-11-08 NOTE — NC FL2 (Signed)
[place] LEVEL OF CARE SCREENING TOOL     IDENTIFICATION  Patient Name: Olivia Mullen Birthdate: 06/03/1936 Sex: female Admission Date (Current Location): 11/11/2016  [facility] and [place] Number:  Herbalist and Address:  [facility] [place], [place] [address], [place], [place] [address]      Provider Number: [id number]  Attending Physician Name and Address:  Velvet Bathe, MD  Relative Name and Phone Number:       Current Level of Care: Hospital Recommended Level of Care: [place] Prior Approval Number:    Date Approved/Denied:   PASRR Number: [id number]  Discharge Plan: SNF    Current Diagnoses: Patient Active Problem List   Diagnosis Date Noted  . Hypomagnesemia 11/07/2016  . Severe sepsis with septic shock ([place]) 11/14/2016  . Acute adrenal insufficiency ([place]) 11/07/2016  . C. difficile diarrhea 11/15/2016  . Sepsis ([place])   . Protein-calorie malnutrition, severe 06/29/2016  . AKI (acute kidney injury) ([place]) 06/28/2016  . Diarrhea 06/28/2016  . Acute blood loss anemia 03/11/2016  . Gastric ulcer with hemorrhage 03/11/2016  . Hyperlipidemia 02/19/2015  . Postoperative anemia due to acute blood loss 10/12/2013  . Osteoarthritis of right hip 08/31/2013  . Depression   . Chronic diastolic congestive heart failure ([place])   . Hypokalemia 03/08/2012  . Hypertension 03/08/2012  . Breast cancer, stage 2 ([place]) 12/16/2011  . Rheumatoid arthritis ([place]) 12/16/2011  . Multinodular goiter (nontoxic) 12/16/2011  . Hypothyroidism (acquired) 12/16/2011  . DM type 2 (diabetes mellitus, type 2) ([place]) 12/16/2011  . Normochromic normocytic anemia 12/16/2011    Orientation RESPIRATION BLADDER Height & Weight      (DOx4)  O2 (2L [place]) Incontinent Weight: 123 lb 14.4 oz (56.2 kg) Height:  5\' 4"  (162.6 cm)  BEHAVIORAL SYMPTOMS/MOOD NEUROLOGICAL BOWEL NUTRITION STATUS      Incontinent Diet  AMBULATORY STATUS COMMUNICATION OF  NEEDS Skin   Extensive Assist Verbally Normal                       Personal Care Assistance Level of Assistance  Bathing, Dressing Bathing Assistance: Maximum assistance   Dressing Assistance: Maximum assistance     Functional Limitations Info  Sight Sight Info: Impaired        SPECIAL CARE FACTORS FREQUENCY  PT (By licensed PT), OT (By licensed OT)     PT Frequency: 5/wk OT Frequency: 5/wk            Contractures      Additional Factors Info  Code Status, Allergies Code Status Info: DNR Allergies Info: Gabapentin, Isosorbide Nitrate, Wellbutrin Bupropion, Zonisamide           Current Medications (11/08/2016):  This is the current hospital active medication list Current Facility-Administered Medications  Medication Dose Route Frequency Provider Last Rate Last Dose  . budesonide (ENTOCORT EC) 24 hr capsule 9 mg  9 mg Oral Daily Waldemar Dickens, MD      . chlorhexidine (PERIDEX) 0.12 % solution 15 mL  15 mL Mouth Rinse BID Velvet Bathe, MD   15 mL at 11/08/16 0844  . dextrose 5 % and 0.45 % NaCl with KCl 20 mEq/L infusion   Intravenous Continuous Velvet Bathe, MD 75 mL/hr at 11/08/16 0944    . heparin injection 5,000 Units  5,000 Units Subcutaneous Q8H Waldemar Dickens, MD   5,000 Units at 11/08/16 1300  . MEDLINE mouth rinse  15 mL Mouth Rinse q12n4p [place]  Wendee Beavers, MD   15 mL at 11/08/16 1154  . ondansetron (ZOFRAN) tablet 4 mg  4 mg Oral Q6H PRN Waldemar Dickens, MD       Or  . ondansetron [facility]) injection 4 mg  4 mg Intravenous Q6H PRN Waldemar Dickens, MD      . piperacillin-tazobactam (ZOSYN) IVPB 2.25 g  2.25 g Intravenous Q8H Cecilio Asper Batchelder, RPH   2.25 g at 11/08/16 1153  . sodium chloride flush (NS) 0.9 % injection 3 mL  3 mL Intravenous Q12H Waldemar Dickens, MD   3 mL at 11/07/16 2248  . SUMAtriptan (IMITREX) tablet 100 mg  100 mg Oral Q2H PRN Waldemar Dickens, MD      . vancomycin (VANCOCIN) 50 mg/mL oral solution 125 mg  125 mg Oral QID Waldemar Dickens, MD   125 mg at 11/08/16 1028     Discharge Medications: Please see discharge summary for a list of discharge medications.  Relevant Imaging Results:  Relevant Lab Results:   Additional Information SSN: [id number]  Jorge Ny, LCSW

## 2016-11-09 ENCOUNTER — Encounter (HOSPITAL_COMMUNITY): Payer: Self-pay | Admitting: *Deleted

## 2016-11-09 LAB — MAGNESIUM: Magnesium: 2.3 mg/dL (ref 1.7–2.4)

## 2016-11-09 LAB — URINALYSIS, ROUTINE W REFLEX MICROSCOPIC
BILIRUBIN URINE: NEGATIVE
Glucose, UA: NEGATIVE mg/dL
Ketones, ur: NEGATIVE mg/dL
NITRITE: NEGATIVE
Protein, ur: 30 mg/dL — AB
SPECIFIC GRAVITY, URINE: 1.018 (ref 1.005–1.030)
Squamous Epithelial / LPF: NONE SEEN
pH: 5 (ref 5.0–8.0)

## 2016-11-09 LAB — CBC
HEMATOCRIT: 26.3 % — AB (ref 36.0–46.0)
HEMOGLOBIN: 8.9 g/dL — AB (ref 12.0–15.0)
MCH: 28.6 pg (ref 26.0–34.0)
MCHC: 33.8 g/dL (ref 30.0–36.0)
MCV: 84.6 fL (ref 78.0–100.0)
Platelets: 138 10*3/uL — ABNORMAL LOW (ref 150–400)
RBC: 3.11 MIL/uL — ABNORMAL LOW (ref 3.87–5.11)
RDW: 19.4 % — AB (ref 11.5–15.5)
WBC: 25.2 10*3/uL — AB (ref 4.0–10.5)

## 2016-11-09 LAB — GLUCOSE, CAPILLARY
GLUCOSE-CAPILLARY: 104 mg/dL — AB (ref 65–99)
GLUCOSE-CAPILLARY: 100 mg/dL — AB (ref 65–99)
GLUCOSE-CAPILLARY: 112 mg/dL — AB (ref 65–99)
GLUCOSE-CAPILLARY: 122 mg/dL — AB (ref 65–99)
Glucose-Capillary: 64 mg/dL — ABNORMAL LOW (ref 65–99)
Glucose-Capillary: 75 mg/dL (ref 65–99)

## 2016-11-09 LAB — BASIC METABOLIC PANEL
BUN: 50 mg/dL — ABNORMAL HIGH (ref 6–20)
CALCIUM: 8.1 mg/dL — AB (ref 8.9–10.3)
Chloride: >130 mmol/L (ref 101–111)
Creatinine, Ser: 4.69 mg/dL — ABNORMAL HIGH (ref 0.44–1.00)
GFR calc non Af Amer: 8 mL/min — ABNORMAL LOW (ref >60)
GFR, EST AFRICAN AMERICAN: 9 mL/min — AB (ref >60)
Glucose, Bld: 94 mg/dL (ref 65–99)
Potassium: 4.7 mmol/L (ref 3.5–5.1)
Sodium: 153 mmol/L — ABNORMAL HIGH (ref 135–145)

## 2016-11-09 MED ORDER — LACTATED RINGERS IV SOLN
INTRAVENOUS | Status: DC
  Administered 2016-11-09: 16:00:00 via INTRAVENOUS

## 2016-11-09 NOTE — Progress Notes (Signed)
PROGRESS NOTE    Olivia Mullen  [id number] DOB: 02-10-1936 DOA: 11/01/2016 PCP: [name], MD  Brief Narrative:  81 y.o. female with medical history significant of anxiety, breast cancer, CHF, depression, headaches, nephrolithiasis, hypertension, hypothyroidism, psoriasis, rheumatoid arthritis presenting from skilled nursing facility in an acutely altered state.   Assessment & Plan:      Sepsis ([place]) - Lactic acidosis resolving. - BP improving - Continue Zosyn. Will obtain urine from foley for urine culture. Given that patient has had antibiotic therapy since admission do not think blood cultures would be useful at this point. - Improving currently - blood culture negative   C. difficile diarrhea - Continue oral vancomycin.     Hypokalemia - Will administer K IV. Has been ordered earlier this AM by my associate. Per my discussion with nursing patient will have obtained 60 meq replacement. Will recheck potassium levels around 1800. - Continue MIVF's  Hypermagnesemia -  2.7 on last check. IVF rehydration - reassess next am.  Hypernatremia - Will place on MIVF's, most likely due to dehydration  Active Problems:   Rheumatoid arthritis ([place])   Hypertension - off antihypertensive medication with last reported BP at 137/44, will continue to monitor.    AKI (acute kidney injury) ([place]) - Most likely secondary to Sepsis although could be secondary to ATN secondary to either sepsis or hypotension. Another possibility is secondary to poor oral intake and prerenal causes.    Diarrhea - none reported to me.    Protein-calorie malnutrition, severe - Will place on nutritional supplementation once patient is able to take food orally    Acute adrenal insufficiency ([place]) - will d/c solucortef in lieu of active infection. Stress dosing for blood pressure completed.  Chronic diastolic HF - I suspect patient is still dry. Continue IVF   DVT prophylaxis: Heparin Code  Status: DNR Family Communication: None at bedside Disposition Plan: pending improvement in condition   Consultants:   None   Procedures: None   Antimicrobials: Vancomycin, Zosyn   Subjective: Pt still somnolent. But more alert today.  Objective: Vitals:   11/09/16 0500 11/09/16 0600 11/09/16 0800 11/09/16 0900  BP: (!) 121/55 (!) 133/50 (!) 119/49 (!) 137/51  Pulse: 88 80 86   Resp: 18 (!) 30 18   Temp:   98 F (36.7 C)   TempSrc:   Axillary   SpO2: 100% 98% 100%   Weight:      Height:        Intake/Output Summary (Last 24 hours) at 11/09/16 1250 Last data filed at 11/09/16 0730  Gross per 24 hour  Intake          2393.75 ml  Output              250 ml  Net          2143.75 ml   Filed Weights   11/07/16 0500 11/08/16 0500 11/09/16 0400  Weight: 54.3 kg (119 lb 11.4 oz) 56.2 kg (123 lb 14.4 oz) 54.2 kg (119 lb 7.8 oz)    Examination:  General exam: somnolent, in nad Respiratory system: equal chest rise, no wheezes Cardiovascular system: S1 & S2 heard, RRR. Marland Kitchen Gastrointestinal system: Abdomen is nondistended, soft and nontender. No organomegaly or masses felt. Normal bowel sounds heard. Central nervous system: difficult to assess due to limited cooperation, no facial asymmetry Extremities: no cyanosis Skin: warm and dry Psychiatry: unable to assess due to limited cooperation.  Data Reviewed: I have personally reviewed following labs  and imaging studies  CBC:  Recent Labs Lab 10/30/2016 1240 10/31/2016 1808 11/07/16 0548 11/08/16 0504 11/09/16 0938  WBC 16.6* 14.4* 21.5* 31.7* 25.2*  NEUTROABS 14.2*  --   --   --   --   HGB 9.6* 11.0* 10.8* 10.4* 8.9*  HCT 29.2* 33.0* 31.9* 31.4* 26.3*  MCV 85.6 83.1 83.5 84.0 84.6  PLT 166 182 166 169 0000000*   Basic Metabolic Panel:  Recent Labs Lab 11/19/2016 1240 10/31/2016 1808 11/07/16 0341 11/07/16 0609 11/07/16 1835 11/08/16 0504  NA 142  --  150*  --   --  151*  K 3.9  --  2.7*  --  3.5 3.1*  CL 124*  --   126*  --   --  128*  CO2 <7*  --  10*  --   --  9*  GLUCOSE 155*  --  78  --   --  157*  BUN 50*  --  49*  --   --  52*  CREATININE 4.42* 3.13* 4.15*  --   --  4.85*  CALCIUM 8.0*  --  7.6*  --   --  7.6*  MG  --   --   --  1.4*  --  2.7*  PHOS  --   --   --  5.1*  --   --    GFR: Estimated Creatinine Clearance: 8.2 mL/min (by C-G formula based on SCr of 4.85 mg/dL (H)). Liver Function Tests:  Recent Labs Lab 11/13/2016 1240  AST 23  ALT 9*  ALKPHOS 81  BILITOT 0.4  PROT 4.7*  ALBUMIN 2.2*   No results for input(s): LIPASE, AMYLASE in the last 168 hours. No results for input(s): AMMONIA in the last 168 hours. Coagulation Profile:  Recent Labs Lab 11/15/2016 1808  INR 1.26   Cardiac Enzymes:  Recent Labs Lab 11/13/2016 1549  TROPONINI <0.03   BNP (last 3 results) No results for input(s): PROBNP in the last 8760 hours. HbA1C: No results for input(s): HGBA1C in the last 72 hours. CBG:  Recent Labs Lab 11/08/16 1646 11/08/16 1959 11/08/16 2337 11/09/16 0411 11/09/16 0815  GLUCAP 118* 113* 93 104* 100*   Lipid Profile: No results for input(s): CHOL, HDL, LDLCALC, TRIG, CHOLHDL, LDLDIRECT in the last 72 hours. Thyroid Function Tests: No results for input(s): TSH, T4TOTAL, FREET4, T3FREE, THYROIDAB in the last 72 hours. Anemia Panel: No results for input(s): VITAMINB12, FOLATE, FERRITIN, TIBC, IRON, RETICCTPCT in the last 72 hours. Sepsis Labs:  Recent Labs Lab 11/15/2016 1252 10/31/2016 1549 11/01/2016 1559 10/21/2016 1808 11/08/16 0504  PROCALCITON  --  0.42  --  0.56 1.96  LATICACIDVEN 4.96*  --  1.98*  --   --     Recent Results (from the past 240 hour(s))  Urine culture     Status: Abnormal   Collection Time: 11/04/2016 12:09 PM  Result Value Ref Range Status   Specimen Description URINE, RANDOM  Final   Special Requests NONE  Final   Culture MULTIPLE SPECIES PRESENT, SUGGEST RECOLLECTION (A)  Final   Report Status 11/07/2016 FINAL  Final  Blood Culture  (routine x 2)     Status: None (Preliminary result)   Collection Time: 10/21/2016 12:20 PM  Result Value Ref Range Status   Specimen Description BLOOD LEFT ANTECUBITAL  Final   Special Requests BOTTLES DRAWN AEROBIC AND ANAEROBIC 5CC  Final   Culture NO GROWTH 3 DAYS  Final   Report Status PENDING  Incomplete  Blood  Culture (routine x 2)     Status: None (Preliminary result)   Collection Time: 10/23/2016 12:40 PM  Result Value Ref Range Status   Specimen Description BLOOD LEFT HAND  Final   Special Requests BOTTLES DRAWN AEROBIC AND ANAEROBIC 5CC  Final   Culture NO GROWTH 3 DAYS  Final   Report Status PENDING  Incomplete  C difficile quick scan w PCR reflex     Status: None   Collection Time: 11/13/2016 12:43 PM  Result Value Ref Range Status   C Diff antigen NEGATIVE NEGATIVE Final   C Diff toxin NEGATIVE NEGATIVE Final   C Diff interpretation No C. difficile detected.  Final  MRSA PCR Screening     Status: None   Collection Time: 11/03/2016  7:00 PM  Result Value Ref Range Status   MRSA by PCR NEGATIVE NEGATIVE Final    Comment:        The GeneXpert MRSA Assay (FDA approved for NASAL specimens only), is one component of a comprehensive MRSA colonization surveillance program. It is not intended to diagnose MRSA infection nor to guide or monitor treatment for MRSA infections.      Radiology Studies: No results found. Scheduled Meds: . budesonide  9 mg Oral Daily  . chlorhexidine  15 mL Mouth Rinse BID  . heparin  5,000 Units Subcutaneous Q8H  . mouth rinse  15 mL Mouth Rinse q12n4p  . piperacillin-tazobactam (ZOSYN)  IV  2.25 g Intravenous Q8H  . sodium chloride flush  3 mL Intravenous Q12H  . vancomycin  125 mg Oral QID   Continuous Infusions: . dextrose 5 % and 0.45 % NaCl with KCl 20 mEq/L 100 mL/hr at 11/09/16 0921     LOS: 3 days    Time spent: > 35 minutes  Velvet Bathe, MD [facility] Pager [phone]  If 7PM-7AM, please contact  night-coverage [email or web address] Password [email or web address] 11/09/2016, 12:50 PM

## 2016-11-09 NOTE — Progress Notes (Signed)
Pharmacy Antibiotic Note  Olivia Mullen is a 81 y.o. female admitted on 10/21/2016 with sepsis due to unknown source. Patient remains on oral vancomycin for history of C Diff.  Patient is afebrile today and WBC is elevated at 25.2 but down from yesterday (steroids stopped). Serum creatinine increased yesterday to 4.85, no BMET today.  Plan: Continue Zosyn 2.25gm IV every 8 hours. Monitor renal function, clinical progress  Height: 5\' 4"  (162.6 cm) Weight: 119 lb 7.8 oz (54.2 kg) IBW/kg (Calculated) : 54.7  Temp (24hrs), Avg:97.8 F (36.6 C), Min:97 F (36.1 C), Max:98.4 F (36.9 C)   Recent Labs Lab 11/09/2016 1240 10/23/2016 1252 11/15/2016 1559 10/29/2016 1808 11/07/16 0341 11/07/16 0548 11/08/16 0504 11/09/16 0938  WBC 16.6*  --   --  14.4*  --  21.5* 31.7* 25.2*  CREATININE 4.42*  --   --  3.13* 4.15*  --  4.85*  --   LATICACIDVEN  --  4.96* 1.98*  --   --   --   --   --     Estimated Creatinine Clearance: 8.2 mL/min (by C-G formula based on SCr of 4.85 mg/dL (H)).    Allergies  Allergen Reactions  . Gabapentin Itching  . Isosorbide Nitrate Itching  . Wellbutrin [Bupropion] Other (See Comments)    mental changes and increase anger  . Zonisamide Itching    Antimicrobials this admission:  Zosyn 1/17 >> Vancomycin 1/17 >> 1/19  Microbiology results: 1/17 MRSA PCR: negative  1/17 blood cx: ngtd 1/17 C. Diff: negative 1/20 UCx: sent  Demetrius Charity, PharmD Acute Care Pharmacy Resident  Pager: [phone] 11/09/2016

## 2016-11-09 NOTE — Progress Notes (Signed)
SLP Cancellation Note  Patient Details Name: Olivia Mullen MRN: [id number] DOB: [birth year]   Cancelled treatment:       Reason Eval/Treat Not Completed: Fatigue/lethargy limiting ability to participate. SLP will f/u.  Deneise Lever, [place] CF-SLP Speech-Language Pathologist [phone]   Aliene Altes 11/09/2016, 8:52 AM

## 2016-11-09 NOTE — Plan of Care (Signed)
Problem: Safety: Goal: Ability to remain free from injury will improve Outcome: Progressing Discussed with patient about mouth care with no teach back displayed at this time.  Also, talking with on-call MD about other solutions to her mouth care since on vancomycin PO

## 2016-11-10 ENCOUNTER — Inpatient Hospital Stay (HOSPITAL_COMMUNITY): Payer: Medicare Other

## 2016-11-10 LAB — CBC
HCT: 26.7 % — ABNORMAL LOW (ref 36.0–46.0)
Hemoglobin: 8.6 g/dL — ABNORMAL LOW (ref 12.0–15.0)
MCH: 28.1 pg (ref 26.0–34.0)
MCHC: 32.2 g/dL (ref 30.0–36.0)
MCV: 87.3 fL (ref 78.0–100.0)
PLATELETS: 142 10*3/uL — AB (ref 150–400)
RBC: 3.06 MIL/uL — AB (ref 3.87–5.11)
RDW: 19.6 % — ABNORMAL HIGH (ref 11.5–15.5)
WBC: 26.9 10*3/uL — AB (ref 4.0–10.5)

## 2016-11-10 LAB — BASIC METABOLIC PANEL
BUN: 50 mg/dL — ABNORMAL HIGH (ref 6–20)
CO2: 8 mmol/L — ABNORMAL LOW (ref 22–32)
Calcium: 8.3 mg/dL — ABNORMAL LOW (ref 8.9–10.3)
Creatinine, Ser: 4.44 mg/dL — ABNORMAL HIGH (ref 0.44–1.00)
GFR calc Af Amer: 10 mL/min — ABNORMAL LOW (ref >60)
GFR calc non Af Amer: 9 mL/min — ABNORMAL LOW (ref >60)
Glucose, Bld: 124 mg/dL — ABNORMAL HIGH (ref 65–99)
POTASSIUM: 4.9 mmol/L (ref 3.5–5.1)
SODIUM: 153 mmol/L — AB (ref 135–145)

## 2016-11-10 LAB — GLUCOSE, CAPILLARY
GLUCOSE-CAPILLARY: 123 mg/dL — AB (ref 65–99)
Glucose-Capillary: 168 mg/dL — ABNORMAL HIGH (ref 65–99)
Glucose-Capillary: 199 mg/dL — ABNORMAL HIGH (ref 65–99)
Glucose-Capillary: 141 mg/dL — ABNORMAL HIGH (ref 65–99)

## 2016-11-10 LAB — ABO/RH: ABO/RH(D): O POS

## 2016-11-10 LAB — HEMOGLOBIN AND HEMATOCRIT, BLOOD
HCT: 18.2 % — ABNORMAL LOW (ref 36.0–46.0)
HEMOGLOBIN: 5.6 g/dL — AB (ref 12.0–15.0)

## 2016-11-10 LAB — PREPARE RBC (CROSSMATCH)

## 2016-11-10 LAB — PROCALCITONIN: Procalcitonin: 0.56 ng/mL

## 2016-11-10 MED ORDER — DEXTROSE 50 % IV SOLN
1.0000 | Freq: Once | INTRAVENOUS | Status: AC
  Administered 2016-11-10: 50 mL via INTRAVENOUS
  Filled 2016-11-10: qty 50

## 2016-11-10 MED ORDER — IPRATROPIUM-ALBUTEROL 0.5-2.5 (3) MG/3ML IN SOLN: 3.0000 mL | Freq: Four times a day (QID) | RESPIRATORY_TRACT | Status: DC | PRN

## 2016-11-10 MED ORDER — SODIUM CHLORIDE 0.9 % IV SOLN
Freq: Once | INTRAVENOUS | Status: AC
  Administered 2016-11-10: 19:00:00 via INTRAVENOUS

## 2016-11-10 MED ORDER — DEXTROSE IN LACTATED RINGERS 5 % IV SOLN
INTRAVENOUS | Status: DC
  Administered 2016-11-10 (×3): via INTRAVENOUS

## 2016-11-10 MED ORDER — METRONIDAZOLE IN NACL 5-0.79 MG/ML-% IV SOLN
500.0000 mg | Freq: Three times a day (TID) | INTRAVENOUS | Status: DC
  Administered 2016-11-10 – 2016-11-15 (×14): 500 mg via INTRAVENOUS
  Filled 2016-11-10 (×14): qty 100

## 2016-11-10 NOTE — Plan of Care (Signed)
Problem: Education: Goal: Knowledge of [facility] Education information/materials will improve Outcome: Progressing Discussed with patient about pain management and blood transfusions with no teach back displayed at this time.  Maintenance IVF running at 20 ml/hr to prevent blood glucose from dropping since patient is NPO.

## 2016-11-10 NOTE — Progress Notes (Signed)
PROGRESS NOTE    Olivia Mullen  [id number] DOB: 1936/04/28 DOA: 11/10/2016 PCP: [name], MD  Brief Narrative:  81 y.o. female with medical history significant of anxiety, breast cancer, CHF, depression, headaches, nephrolithiasis, hypertension, hypothyroidism, psoriasis, rheumatoid arthritis presenting from skilled nursing facility in an acutely altered state.   Assessment & Plan:      Sepsis ([place]) - Lactic acidosis resolving. - BP improving - Continue Zosyn. Will obtain urine from foley for urine culture. Given that patient has had antibiotic therapy since admission do not think blood cultures would be useful at this point. - Improving currently - blood culture negative  Anemia - Nursing reports seeing blood in stools intermittently. Will transfuse 3 units of packed red blood cells as I would like for hemoglobin level above a 8.0 given cardiac history.   C. difficile diarrhea - unable to take oral vancomycin per my discussion with nurse. Will place on IV flagyl - suspect intermittent BRBPR secondary to active infection.    Hypokalemia - resolved on last check - reassess next am.  Hypermagnesemia -  2.3 on last check.   Hypernatremia - Will place on MIVF's, most likely due to dehydration  Active Problems:   Rheumatoid arthritis ([place])   Hypertension - off antihypertensive medication with last reported BP at 137/44, will continue to monitor.    AKI (acute kidney injury) ([place]) - Most likely secondary to Sepsis although could be secondary to ATN secondary to either sepsis or hypotension. Another possibility is secondary to poor oral intake and prerenal causes.  - currently trending down  Hyperchloremia - replaced fluids to lactated ringers.    Diarrhea - none reported to me.    Protein-calorie malnutrition, severe - Will place on nutritional supplementation once patient is able to take food orally    Acute adrenal insufficiency ([place]) - will d/c  solucortef in lieu of active infection. Stress dosing for blood pressure completed.  Chronic diastolic HF - I suspect patient is still dry. But will decreased fluid rate today. May have to add lasix.    DVT prophylaxis: Heparin Code Status: DNR Family Communication: None at bedside Disposition Plan: pending improvement in condition   Consultants:   None   Procedures: None   Antimicrobials: Vancomycin, Zosyn   Subjective: Pt still somnolent. Responding to questions with yes and no  Objective: Vitals:   11/10/16 0500 11/10/16 0600 11/10/16 0822 11/10/16 1200  BP: 132/67 137/61 (!) 135/59 (!) 142/51  Pulse: 99 (!) 103 100 84  Resp: (!) 21 (!) 23 (!) 28 19  Temp:   97.8 F (36.6 C) 98.9 F (37.2 C)  TempSrc:   Axillary Axillary  SpO2: 95% 98% 98% 98%  Weight:      Height:        Intake/Output Summary (Last 24 hours) at 11/10/16 1438 Last data filed at 11/10/16 1241  Gross per 24 hour  Intake          2749.67 ml  Output                0 ml  Net          2749.67 ml   Filed Weights   11/08/16 0500 11/09/16 0400 11/10/16 0300  Weight: 56.2 kg (123 lb 14.4 oz) 54.2 kg (119 lb 7.8 oz) 55 kg (121 lb 4.1 oz)    Examination:  General exam: somnolent, in nad Respiratory system: equal chest rise, no wheezes Cardiovascular system: S1 & S2 heard, RRR. Marland Kitchen Gastrointestinal system:  Abdomen is nondistended, soft and nontender. No organomegaly or masses felt. Normal bowel sounds heard. Central nervous system: difficult to assess due to limited cooperation, no facial asymmetry Extremities: no cyanosis Skin: warm and dry Psychiatry: unable to assess due to limited cooperation.  Data Reviewed: I have personally reviewed following labs and imaging studies  CBC:  Recent Labs Lab 11/18/2016 1240 11/03/2016 1808 11/07/16 0548 11/08/16 0504 11/09/16 0938 11/10/16 0531 11/10/16 1233  WBC 16.6* 14.4* 21.5* 31.7* 25.2* 26.9*  --   NEUTROABS 14.2*  --   --   --   --   --   --     HGB 9.6* 11.0* 10.8* 10.4* 8.9* 8.6* 5.6*  HCT 29.2* 33.0* 31.9* 31.4* 26.3* 26.7* 18.2*  MCV 85.6 83.1 83.5 84.0 84.6 87.3  --   PLT 166 182 166 169 138* 142*  --    Basic Metabolic Panel:  Recent Labs Lab 11/15/2016 1240 10/31/2016 1808 11/07/16 0341 11/07/16 0609 11/07/16 1835 11/08/16 0504 11/09/16 1245 11/09/16 1330 11/10/16 0531  NA 142  --  150*  --   --  151* 153*  --  153*  K 3.9  --  2.7*  --  3.5 3.1* 4.7  --  4.9  CL 124*  --  126*  --   --  128* >130*  --  >130*  CO2 <7*  --  10*  --   --  9* <7*  --  8*  GLUCOSE 155*  --  78  --   --  157* 94  --  124*  BUN 50*  --  49*  --   --  52* 50*  --  50*  CREATININE 4.42* 3.13* 4.15*  --   --  4.85* 4.69*  --  4.44*  CALCIUM 8.0*  --  7.6*  --   --  7.6* 8.1*  --  8.3*  MG  --   --   --  1.4*  --  2.7*  --  2.3  --   PHOS  --   --   --  5.1*  --   --   --   --   --    GFR: Estimated Creatinine Clearance: 9 mL/min (by C-G formula based on SCr of 4.44 mg/dL (H)). Liver Function Tests:  Recent Labs Lab 11/12/2016 1240  AST 23  ALT 9*  ALKPHOS 81  BILITOT 0.4  PROT 4.7*  ALBUMIN 2.2*   No results for input(s): LIPASE, AMYLASE in the last 168 hours. No results for input(s): AMMONIA in the last 168 hours. Coagulation Profile:  Recent Labs Lab 11/12/2016 1808  INR 1.26   Cardiac Enzymes:  Recent Labs Lab 11/01/2016 1549  TROPONINI <0.03   BNP (last 3 results) No results for input(s): PROBNP in the last 8760 hours. HbA1C: No results for input(s): HGBA1C in the last 72 hours. CBG:  Recent Labs Lab 11/09/16 1540 11/09/16 2010 11/09/16 2352 11/10/16 0124 11/10/16 1223  GLUCAP 122* 75 64* 123* 168*   Lipid Profile: No results for input(s): CHOL, HDL, LDLCALC, TRIG, CHOLHDL, LDLDIRECT in the last 72 hours. Thyroid Function Tests: No results for input(s): TSH, T4TOTAL, FREET4, T3FREE, THYROIDAB in the last 72 hours. Anemia Panel: No results for input(s): VITAMINB12, FOLATE, FERRITIN, TIBC, IRON,  RETICCTPCT in the last 72 hours. Sepsis Labs:  Recent Labs Lab 11/01/2016 1252 11/04/2016 1549 10/27/2016 1559 11/13/2016 1808 11/08/16 0504 11/10/16 0531  PROCALCITON  --  0.42  --  0.56 1.96 0.56  LATICACIDVEN 4.96*  --  1.98*  --   --   --     Recent Results (from the past 240 hour(s))  Urine culture     Status: Abnormal   Collection Time: 10/28/2016 12:09 PM  Result Value Ref Range Status   Specimen Description URINE, RANDOM  Final   Special Requests NONE  Final   Culture MULTIPLE SPECIES PRESENT, SUGGEST RECOLLECTION (A)  Final   Report Status 11/07/2016 FINAL  Final  Blood Culture (routine x 2)     Status: None (Preliminary result)   Collection Time: 11/10/2016 12:20 PM  Result Value Ref Range Status   Specimen Description BLOOD LEFT ANTECUBITAL  Final   Special Requests BOTTLES DRAWN AEROBIC AND ANAEROBIC 5CC  Final   Culture NO GROWTH 4 DAYS  Final   Report Status PENDING  Incomplete  Blood Culture (routine x 2)     Status: None (Preliminary result)   Collection Time: 11/05/2016 12:40 PM  Result Value Ref Range Status   Specimen Description BLOOD LEFT HAND  Final   Special Requests BOTTLES DRAWN AEROBIC AND ANAEROBIC 5CC  Final   Culture NO GROWTH 4 DAYS  Final   Report Status PENDING  Incomplete  C difficile quick scan w PCR reflex     Status: None   Collection Time: 11/05/2016 12:43 PM  Result Value Ref Range Status   C Diff antigen NEGATIVE NEGATIVE Final   C Diff toxin NEGATIVE NEGATIVE Final   C Diff interpretation No C. difficile detected.  Final  MRSA PCR Screening     Status: None   Collection Time: 11/15/2016  7:00 PM  Result Value Ref Range Status   MRSA by PCR NEGATIVE NEGATIVE Final    Comment:        The GeneXpert MRSA Assay (FDA approved for NASAL specimens only), is one component of a comprehensive MRSA colonization surveillance program. It is not intended to diagnose MRSA infection nor to guide or monitor treatment for MRSA infections.   Culture, Urine      Status: None (Preliminary result)   Collection Time: 11/09/16  7:57 AM  Result Value Ref Range Status   Specimen Description URINE, RANDOM  Final   Special Requests NONE  Final   Culture CULTURE REINCUBATED FOR BETTER GROWTH  Final   Report Status PENDING  Incomplete     Radiology Studies: Dg Chest Port 1 View  Result Date: 11/10/2016 CLINICAL DATA:  81 y/o  F; aspiration of the airways. EXAM: PORTABLE CHEST 1 VIEW COMPARISON:  11/09/2016 chest radiograph FINDINGS: Stable cardiac silhouette within normal limits given projection and technique. Aortic atherosclerosis with calcification. Thyroid surgery surgical clips. Partially visualized left shoulder hemiarthroplasty. Diminished bilateral acromial humeral distance bilateral indicating rotator cuff injury. Right axillary surgical clips. Pulmonary venous hypertension. No focal consolidation. No pleural effusion. IMPRESSION: Pulmonary venous hypertension.  No focal consolidation. Electronically Signed   By: Kristine Garbe M.D.   On: 11/10/2016 06:14   Scheduled Meds: . budesonide  9 mg Oral Daily  . chlorhexidine  15 mL Mouth Rinse BID  . mouth rinse  15 mL Mouth Rinse q12n4p  . piperacillin-tazobactam (ZOSYN)  IV  2.25 g Intravenous Q8H  . sodium chloride flush  3 mL Intravenous Q12H  . vancomycin  125 mg Oral QID   Continuous Infusions: . dextrose 5% lactated ringers 50 mL/hr at 11/10/16 1239     LOS: 4 days    Time spent: > 35 minutes  Velvet Bathe, MD [facility] Pager [phone]  If 7PM-7AM, please contact night-coverage [email or web address] Password [facility] 11/10/2016, 2:38 PM

## 2016-11-10 NOTE — Accreditation Note (Signed)
CRITICAL VALUE ALERT  Critical value received:  HGB 5.6  Date of notification:   11/10/16  Time of notification:  [id number]  Critical value read back:Yes.    Nurse who received alert:  Lester Kinsman RN  MD notified (1st page):  Dr. Wendee Beavers  Time of first page:  1350  MD notified (2nd page):  Time of second page:  Responding MD:Dr. Wendee Beavers  Time MD responded:  1435

## 2016-11-10 NOTE — Progress Notes (Signed)
Paged on-call MD for CXR patient may of had possible aspiration pulled green plugs and green emesis out.

## 2016-11-10 NOTE — Progress Notes (Signed)
Speech Language Pathology Treatment: Dysphagia  Patient Details Name: Olivia Mullen MRN: [id number] DOB: [birth year] Today's Date: 11/10/2016 Time: [id number] SLP Time Calculation (min) (ACUTE ONLY): 8 min  Assessment / Plan / Recommendation Clinical Impression  Patient continues to display mental status inappropriate for PO intake. Dried, bloody secretions adhering to dentition and lingual surface. Performed oral care, which was limited by patient guarding. She tolerated cleaning of teeth, however when cleaning of lingual surface attempted, patient winces and turns head away. Encouraged patient and verbally requested permission to clean tongue; patient stated, "no." Opens eyes upon command, however immediately closes them. Requested if patient would like an ice chip and she verbalizes, "yes." Presented ice to oral cavity; patient with no lip closure, lingual or mandibular movement despite max assist. Unable to hold ice chip in oral cavity. Mental status has limited progress. No family present at time of treatment. Patient may need short term means of alternate nutrition, family may benefit from palliative consult. SLP will continue to follow.   HPI HPI: 81 y.o.femalewith medical history significant of anxiety, breast cancer, CHF, depression, headaches, nephrolithiasis, hypertension, hypothyroidism, psoriasis, rheumatoid arthritis presenting from skilled nursing facility in an acutely altered state. Per report patient started becoming less talkative and interactive 1 day ago. Little to no oral intake over the last 24 hours. Remained oriented 4 until the morning of admission when patient was minimally responsive. Patient found to be hypothermic. Of note patient recently finished oral therapy for C. difficile on 10/30/2016 with Flagyl. Suspected urologic etiology of sepsis.       SLP Plan  Continue with current plan of care     Recommendations  Diet recommendations: NPO;Alternative means -  temporary Medication Administration: Via alternative means                General recommendations:  Oral Care Recommendations: Oral care QID;Staff/trained caregiver to provide oral care Follow up Recommendations: Skilled Nursing facility Plan: Continue with current plan of care       [place], [place] CF-SLP Speech-Language Pathologist [phone]   Aliene Altes 11/10/2016, 1:42 PM

## 2016-11-11 LAB — CULTURE, BLOOD (ROUTINE X 2)
Culture: NO GROWTH
Culture: NO GROWTH

## 2016-11-11 LAB — GLUCOSE, CAPILLARY
Glucose-Capillary: 108 mg/dL — ABNORMAL HIGH (ref 65–99)
Glucose-Capillary: 112 mg/dL — ABNORMAL HIGH (ref 65–99)
Glucose-Capillary: 123 mg/dL — ABNORMAL HIGH (ref 65–99)
Glucose-Capillary: 130 mg/dL — ABNORMAL HIGH (ref 65–99)
Glucose-Capillary: 133 mg/dL — ABNORMAL HIGH (ref 65–99)
Glucose-Capillary: 136 mg/dL — ABNORMAL HIGH (ref 65–99)
Glucose-Capillary: 90 mg/dL (ref 65–99)

## 2016-11-11 LAB — COMPREHENSIVE METABOLIC PANEL
ALBUMIN: 2.2 g/dL — AB (ref 3.5–5.0)
ALK PHOS: 129 U/L — AB (ref 38–126)
ALT: 87 U/L — ABNORMAL HIGH (ref 14–54)
AST: 72 U/L — AB (ref 15–41)
BILIRUBIN TOTAL: 1.2 mg/dL (ref 0.3–1.2)
BUN: 49 mg/dL — AB (ref 6–20)
CALCIUM: 8.8 mg/dL — AB (ref 8.9–10.3)
CO2: 11 mmol/L — ABNORMAL LOW (ref 22–32)
Chloride: >130 mmol/L (ref 101–111)
Creatinine, Ser: 4.09 mg/dL — ABNORMAL HIGH (ref 0.44–1.00)
GFR calc Af Amer: 11 mL/min — ABNORMAL LOW (ref >60)
GFR calc non Af Amer: 10 mL/min — ABNORMAL LOW (ref >60)
GLUCOSE: 113 mg/dL — AB (ref 65–99)
POTASSIUM: 3.2 mmol/L — AB (ref 3.5–5.1)
Sodium: 158 mmol/L — ABNORMAL HIGH (ref 135–145)
TOTAL PROTEIN: 5.7 g/dL — AB (ref 6.5–8.1)

## 2016-11-11 LAB — CBC
HEMATOCRIT: 42.6 % (ref 36.0–46.0)
HEMOGLOBIN: 14.8 g/dL (ref 12.0–15.0)
MCH: 29.3 pg (ref 26.0–34.0)
MCHC: 34.7 g/dL (ref 30.0–36.0)
MCV: 84.4 fL (ref 78.0–100.0)
Platelets: 100 10*3/uL — ABNORMAL LOW (ref 150–400)
RBC: 5.05 MIL/uL (ref 3.87–5.11)
RDW: 17.2 % — ABNORMAL HIGH (ref 11.5–15.5)
WBC: 33.3 10*3/uL — AB (ref 4.0–10.5)

## 2016-11-11 MED ORDER — POTASSIUM CHLORIDE 2 MEQ/ML IV SOLN
30.0000 meq | Freq: Once | INTRAVENOUS | Status: AC
  Administered 2016-11-11: 30 meq via INTRAVENOUS
  Filled 2016-11-11: qty 15

## 2016-11-11 MED ORDER — SODIUM BICARBONATE 650 MG PO TABS
650.0000 mg | ORAL_TABLET | Freq: Every day | ORAL | Status: DC
  Administered 2016-11-12 – 2016-11-13 (×2): 650 mg via ORAL
  Filled 2016-11-11 (×2): qty 1

## 2016-11-11 MED ORDER — LEVOTHYROXINE SODIUM 100 MCG IV SOLR
37.5000 ug | Freq: Every day | INTRAVENOUS | Status: DC
  Administered 2016-11-11 – 2016-11-13 (×3): 37.5 ug via INTRAVENOUS
  Filled 2016-11-11 (×3): qty 5

## 2016-11-11 MED ORDER — FUROSEMIDE 10 MG/ML IJ SOLN
40.0000 mg | Freq: Once | INTRAMUSCULAR | Status: AC
  Administered 2016-11-11: 40 mg via INTRAVENOUS
  Filled 2016-11-11: qty 4

## 2016-11-11 NOTE — Progress Notes (Signed)
Speech Language Pathology Treatment: Dysphagia  Patient Details Name: Olivia Mullen MRN: [id number] DOB: 1936-06-08 Today's Date: 11/11/2016 Time: [id number] SLP Time Calculation (min) (ACUTE ONLY): 23 min  Assessment / Plan / Recommendation Clinical Impression  Treatment focused on po readiness. Patient alert but with poor sustained attention. Oral cavity continues to be coated with dried secretions and blood indicative of poor secretions management. SLP provided thorough oral care to facilitate decreased risk of an aspiration related infection as patient with wet vocal quality indicating aspiration of secretions. Single ice chip provided with little to no oral manipulation of bolus despite max verbal, visual, and tactile cueing. Pharyngeal swallow initiated however severely weak with only subtle hyolaryngeal movement noted upon palpation and c/o pain with swallow. Patient not safe for pos at this time. May need to consider temporary non-oral means of nutrition. SLP will continue to f/u.    HPI HPI: 81 y.o.femalewith medical history significant of anxiety, breast cancer, CHF, depression, headaches, nephrolithiasis, hypertension, hypothyroidism, psoriasis, rheumatoid arthritis presenting from skilled nursing facility in an acutely altered state. Per report patient started becoming less talkative and interactive 1 day ago. Little to no oral intake over the last 24 hours. Remained oriented 4 until the morning of admission when patient was minimally responsive. Patient found to be hypothermic. Of note patient recently finished oral therapy for C. difficile on 10/30/2016 with Flagyl. Suspected urologic etiology of sepsis.       SLP Plan  Continue with current plan of care     Recommendations  Diet recommendations: NPO Medication Administration: Via alternative means                Oral Care Recommendations: Oral care QID Follow up Recommendations: Skilled Nursing facility Plan: Continue  with current plan of care       [place], [place] [phone]   [place] 11/11/2016, 10:11 AM

## 2016-11-11 NOTE — Care Management Important Message (Signed)
Important Message  Patient Details  Name: Olivia Mullen MRN: [id number] Date of Birth: 17-Feb-1936   Medicare Important Message Given:  Yes    [name] 11/11/2016, 1:54 PM

## 2016-11-11 NOTE — Progress Notes (Addendum)
PROGRESS NOTE    Olivia Mullen  [id number] DOB: 16-May-1936 DOA: 11/10/2016 PCP: [name], MD  Brief Narrative:  81 y.o. female with medical history significant of anxiety, breast cancer, CHF, depression, headaches, nephrolithiasis, hypertension, hypothyroidism, psoriasis, rheumatoid arthritis presenting from skilled nursing facility in an acutely altered state.   Assessment & Plan:      Sepsis ([place]) - Lactic acidosis resolving. - BP improving - Continue Zosyn. Will obtain urine from foley for urine culture. Given that patient has had antibiotic therapy since admission do not think blood cultures would be useful at this point. - Improving currently - blood culture negative  Anemia - Nursing reports seeing blood in stools intermittently. S/p transfusion of 3 units of packed red blood cells.  - Placed order for cbc (post transfusion. Awaiting results)   C. difficile diarrhea - unable to take oral vancomycin per my discussion with nurse. Continue on IV flagyl - suspect intermittent BRBPR secondary to active infection.    Hypokalemia - administer K replacement IV  Hypermagnesemia -  Resolved on last check   Hypernatremia - Continue to monitor have been treating with fluids  Active Problems:   Rheumatoid arthritis ([place])   Hypertension - off antihypertensive medication with last reported BP at 137/44, will continue to monitor.    AKI (acute kidney injury) ([place]) - Most likely secondary to Sepsis although could be secondary to ATN secondary to either sepsis or hypotension. Another possibility is secondary to poor oral intake and prerenal causes.  - currently trending down, reassess today.  Hyperchloremia - replaced fluids to lactated ringers. Still elevated    Diarrhea - none reported to me.    Protein-calorie malnutrition, severe - Will place on nutritional supplementation once patient is able to take food orally    Acute adrenal insufficiency ([place]) -  will d/c solucortef in lieu of active infection. Stress dosing for blood pressure completed.  Chronic diastolic HF - Will administer lasix today.   DVT prophylaxis: Heparin Code Status: DNR Family Communication: None at bedside Disposition Plan: pending improvement in condition   Consultants:   None   Procedures: None   Antimicrobials: Vancomycin, Zosyn   Subjective: Pt still somnolent. Responding to questions with yes and no  Objective: Vitals:   11/11/16 0430 11/11/16 0500 11/11/16 0600 11/11/16 0700  BP:  (!) 149/69 (!) 159/67   Pulse: 100 98 97   Resp: (!) 24 (!) 22 (!) 22   Temp:  97.3 F (36.3 C)  99.2 F (37.3 C)  TempSrc:  Axillary  Axillary  SpO2: 97% 97% 97%   Weight:      Height:        Intake/Output Summary (Last 24 hours) at 11/11/16 1108 Last data filed at 11/11/16 1002  Gross per 24 hour  Intake          3510.26 ml  Output                0 ml  Net          3510.26 ml   Filed Weights   11/09/16 0400 11/10/16 0300 11/11/16 0148  Weight: 54.2 kg (119 lb 7.8 oz) 55 kg (121 lb 4.1 oz) 55.2 kg (121 lb 11.1 oz)    Examination:  General exam: somnolent, in nad Respiratory system: equal chest rise, no wheezes Cardiovascular system: S1 & S2 heard, RRR. Marland Kitchen Gastrointestinal system: Abdomen is nondistended, soft and nontender. No organomegaly or masses felt. Normal bowel sounds heard. Central nervous system: difficult  to assess due to limited cooperation, no facial asymmetry Extremities: no cyanosis Skin: warm and dry Psychiatry: unable to assess due to limited cooperation.  Data Reviewed: I have personally reviewed following labs and imaging studies  CBC:  Recent Labs Lab 11/05/2016 1240 10/29/2016 1808 11/07/16 0548 11/08/16 0504 11/09/16 0938 11/10/16 0531 11/10/16 1233  WBC 16.6* 14.4* 21.5* 31.7* 25.2* 26.9*  --   NEUTROABS 14.2*  --   --   --   --   --   --   HGB 9.6* 11.0* 10.8* 10.4* 8.9* 8.6* 5.6*  HCT 29.2* 33.0* 31.9* 31.4* 26.3*  26.7* 18.2*  MCV 85.6 83.1 83.5 84.0 84.6 87.3  --   PLT 166 182 166 169 138* 142*  --    Basic Metabolic Panel:  Recent Labs Lab 11/18/2016 1240 10/24/2016 1808 11/07/16 0341 11/07/16 0609 11/07/16 1835 11/08/16 0504 11/09/16 1245 11/09/16 1330 11/10/16 0531  NA 142  --  150*  --   --  151* 153*  --  153*  K 3.9  --  2.7*  --  3.5 3.1* 4.7  --  4.9  CL 124*  --  126*  --   --  128* >130*  --  >130*  CO2 <7*  --  10*  --   --  9* <7*  --  8*  GLUCOSE 155*  --  78  --   --  157* 94  --  124*  BUN 50*  --  49*  --   --  52* 50*  --  50*  CREATININE 4.42* 3.13* 4.15*  --   --  4.85* 4.69*  --  4.44*  CALCIUM 8.0*  --  7.6*  --   --  7.6* 8.1*  --  8.3*  MG  --   --   --  1.4*  --  2.7*  --  2.3  --   PHOS  --   --   --  5.1*  --   --   --   --   --    GFR: Estimated Creatinine Clearance: 9 mL/min (by C-G formula based on SCr of 4.44 mg/dL (H)). Liver Function Tests:  Recent Labs Lab 11/17/2016 1240  AST 23  ALT 9*  ALKPHOS 81  BILITOT 0.4  PROT 4.7*  ALBUMIN 2.2*   No results for input(s): LIPASE, AMYLASE in the last 168 hours. No results for input(s): AMMONIA in the last 168 hours. Coagulation Profile:  Recent Labs Lab 10/28/2016 1808  INR 1.26   Cardiac Enzymes:  Recent Labs Lab 10/21/2016 1549  TROPONINI <0.03   BNP (last 3 results) No results for input(s): PROBNP in the last 8760 hours. HbA1C: No results for input(s): HGBA1C in the last 72 hours. CBG:  Recent Labs Lab 11/10/16 1706 11/10/16 1957 11/11/16 0008 11/11/16 0250 11/11/16 0733  GLUCAP 199* 141* 108* 123* 130*   Lipid Profile: No results for input(s): CHOL, HDL, LDLCALC, TRIG, CHOLHDL, LDLDIRECT in the last 72 hours. Thyroid Function Tests: No results for input(s): TSH, T4TOTAL, FREET4, T3FREE, THYROIDAB in the last 72 hours. Anemia Panel: No results for input(s): VITAMINB12, FOLATE, FERRITIN, TIBC, IRON, RETICCTPCT in the last 72 hours. Sepsis Labs:  Recent Labs Lab 11/18/2016 1252  11/15/2016 1549 11/17/2016 1559 10/21/2016 1808 11/08/16 0504 11/10/16 0531  PROCALCITON  --  0.42  --  0.56 1.96 0.56  LATICACIDVEN 4.96*  --  1.98*  --   --   --     Recent Results (  from the past 240 hour(s))  Urine culture     Status: Abnormal   Collection Time: 10/26/2016 12:09 PM  Result Value Ref Range Status   Specimen Description URINE, RANDOM  Final   Special Requests NONE  Final   Culture MULTIPLE SPECIES PRESENT, SUGGEST RECOLLECTION (A)  Final   Report Status 11/07/2016 FINAL  Final  Blood Culture (routine x 2)     Status: None (Preliminary result)   Collection Time: 11/11/2016 12:20 PM  Result Value Ref Range Status   Specimen Description BLOOD LEFT ANTECUBITAL  Final   Special Requests BOTTLES DRAWN AEROBIC AND ANAEROBIC 5CC  Final   Culture NO GROWTH 4 DAYS  Final   Report Status PENDING  Incomplete  Blood Culture (routine x 2)     Status: None (Preliminary result)   Collection Time: 11/01/2016 12:40 PM  Result Value Ref Range Status   Specimen Description BLOOD LEFT HAND  Final   Special Requests BOTTLES DRAWN AEROBIC AND ANAEROBIC 5CC  Final   Culture NO GROWTH 4 DAYS  Final   Report Status PENDING  Incomplete  C difficile quick scan w PCR reflex     Status: None   Collection Time: 10/24/2016 12:43 PM  Result Value Ref Range Status   C Diff antigen NEGATIVE NEGATIVE Final   C Diff toxin NEGATIVE NEGATIVE Final   C Diff interpretation No C. difficile detected.  Final  MRSA PCR Screening     Status: None   Collection Time: 11/10/2016  7:00 PM  Result Value Ref Range Status   MRSA by PCR NEGATIVE NEGATIVE Final    Comment:        The GeneXpert MRSA Assay (FDA approved for NASAL specimens only), is one component of a comprehensive MRSA colonization surveillance program. It is not intended to diagnose MRSA infection nor to guide or monitor treatment for MRSA infections.   Culture, Urine     Status: None (Preliminary result)   Collection Time: 11/09/16  7:57 AM    Result Value Ref Range Status   Specimen Description URINE, RANDOM  Final   Special Requests NONE  Final   Culture CULTURE REINCUBATED FOR BETTER GROWTH  Final   Report Status PENDING  Incomplete     Radiology Studies: Dg Chest Port 1 View  Result Date: 11/10/2016 CLINICAL DATA:  81 y/o  F; aspiration of the airways. EXAM: PORTABLE CHEST 1 VIEW COMPARISON:  11/10/2016 chest radiograph FINDINGS: Stable cardiac silhouette within normal limits given projection and technique. Aortic atherosclerosis with calcification. Thyroid surgery surgical clips. Partially visualized left shoulder hemiarthroplasty. Diminished bilateral acromial humeral distance bilateral indicating rotator cuff injury. Right axillary surgical clips. Pulmonary venous hypertension. No focal consolidation. No pleural effusion. IMPRESSION: Pulmonary venous hypertension.  No focal consolidation. Electronically Signed   By: Kristine Garbe M.D.   On: 11/10/2016 06:14   Scheduled Meds: . budesonide  9 mg Oral Daily  . chlorhexidine  15 mL Mouth Rinse BID  . mouth rinse  15 mL Mouth Rinse q12n4p  . metronidazole  500 mg Intravenous Q8H  . piperacillin-tazobactam (ZOSYN)  IV  2.25 g Intravenous Q8H  . sodium chloride flush  3 mL Intravenous Q12H  . vancomycin  125 mg Oral QID   Continuous Infusions: . dextrose 5% lactated ringers 50 mL/hr at 11/11/16 0504     LOS: 5 days    Time spent: > 35 minutes  Velvet Bathe, MD [facility] Pager [phone]  If 7PM-7AM, please contact night-coverage [email or web address]  Password [email or web address] 11/11/2016, 11:08 AM   Addendum:  Decreased bicarb most likely due to kidney dysfunction. Will add sodium bicarb.  Concern is that patient is + 14 positive. She was dehydrated at first as such will d/c fluids and administer lasix. Placed order to insert foley catheter for improvement in urine output

## 2016-11-12 ENCOUNTER — Inpatient Hospital Stay (HOSPITAL_COMMUNITY): Payer: Medicare Other

## 2016-11-12 LAB — TYPE AND SCREEN
BLOOD PRODUCT EXPIRATION DATE: 201802142359
Blood Product Expiration Date: 201802022359
Blood Product Expiration Date: 201802022359
ISSUE DATE / TIME: 201801211833
ISSUE DATE / TIME: 201801212302
ISSUE DATE / TIME: 201801220206
UNIT TYPE AND RH: 5100
Unit Type and Rh: 5100
Unit Type and Rh: 5100

## 2016-11-12 LAB — CBC
HCT: 42.6 % (ref 36.0–46.0)
Hemoglobin: 15.1 g/dL — ABNORMAL HIGH (ref 12.0–15.0)
MCH: 29.7 pg (ref 26.0–34.0)
MCHC: 35.4 g/dL (ref 30.0–36.0)
MCV: 83.7 fL (ref 78.0–100.0)
PLATELETS: 76 10*3/uL — AB (ref 150–400)
RBC: 5.09 MIL/uL (ref 3.87–5.11)
RDW: 18.6 % — AB (ref 11.5–15.5)
WBC: 29.8 10*3/uL — ABNORMAL HIGH (ref 4.0–10.5)

## 2016-11-12 LAB — BASIC METABOLIC PANEL
BUN: 57 mg/dL — AB (ref 6–20)
CO2: 12 mmol/L — AB (ref 22–32)
CREATININE: 3.9 mg/dL — AB (ref 0.44–1.00)
Calcium: 8.8 mg/dL — ABNORMAL LOW (ref 8.9–10.3)
GFR calc non Af Amer: 10 mL/min — ABNORMAL LOW (ref >60)
GFR, EST AFRICAN AMERICAN: 12 mL/min — AB (ref >60)
Glucose, Bld: 65 mg/dL (ref 65–99)
Potassium: 3.4 mmol/L — ABNORMAL LOW (ref 3.5–5.1)
SODIUM: 166 mmol/L — AB (ref 135–145)

## 2016-11-12 LAB — GLUCOSE, CAPILLARY
GLUCOSE-CAPILLARY: 63 mg/dL — AB (ref 65–99)
GLUCOSE-CAPILLARY: 97 mg/dL (ref 65–99)
Glucose-Capillary: 80 mg/dL (ref 65–99)
Glucose-Capillary: 81 mg/dL (ref 65–99)
Glucose-Capillary: 76 mg/dL (ref 65–99)
Glucose-Capillary: 182 mg/dL — ABNORMAL HIGH (ref 65–99)
Glucose-Capillary: 170 mg/dL — ABNORMAL HIGH (ref 65–99)
Glucose-Capillary: 185 mg/dL — ABNORMAL HIGH (ref 65–99)

## 2016-11-12 LAB — URINE CULTURE: Culture: >=100000 — AB

## 2016-11-12 MED ORDER — SODIUM CHLORIDE 0.9 % IV SOLN
500.0000 mg | INTRAVENOUS | Status: DC
  Administered 2016-11-12 – 2016-11-15 (×4): 500 mg via INTRAVENOUS
  Filled 2016-11-12 (×4): qty 0.5

## 2016-11-12 MED ORDER — DEXTROSE 50 % IV SOLN
INTRAVENOUS | Status: AC
  Administered 2016-11-12: 50 mL
  Filled 2016-11-12: qty 50

## 2016-11-12 MED ORDER — METHYLPREDNISOLONE SODIUM SUCC 125 MG IJ SOLR
INTRAMUSCULAR | Status: AC
  Administered 2016-11-12: 125 mg
  Filled 2016-11-12: qty 2

## 2016-11-12 MED ORDER — JEVITY 1.2 CAL PO LIQD
1000.0000 mL | ORAL | Status: DC
  Administered 2016-11-12: 17:00:00
  Administered 2016-11-14 – 2016-11-16 (×3): 1000 mL
  Filled 2016-11-12 (×8): qty 1000

## 2016-11-12 NOTE — Progress Notes (Signed)
Cortrak tube placed to 80 cm in pts right nare. Pt tolerated fairly well. Xray has been ordered and tube has been added to pt assessment. Please save cortrak tube if it become dislodged, it can be reinserted. Please contact the cortrak team with any questions or concerns.

## 2016-11-12 NOTE — Progress Notes (Signed)
PROGRESS NOTE    Olivia Mullen  [id number] DOB: Jul 23, 1936 DOA: 10/26/2016 PCP: [name], MD  Brief Narrative:  81 y.o. female with medical history significant of anxiety, breast cancer, CHF, depression, headaches, nephrolithiasis, hypertension, hypothyroidism, psoriasis, rheumatoid arthritis presenting from skilled nursing facility in an acutely altered state.   Assessment & Plan:      Sepsis ([place]) - Lactic acidosis resolving. - BP improving - Continue Zosyn. Will obtain urine from foley for urine culture. Given that patient has had antibiotic therapy since admission do not think blood cultures would be useful at this point. - Improving currently - blood culture negative - Urine culture growing enterobacter and enterococcus. Enterococcus treated now treating enterobacter related UTI.  Anemia - resolved after transfusion of 3 units of PRBC    C. difficile diarrhea - unable to take oral vancomycin per my discussion with nurse. Continue on IV flagyl - suspect intermittent BRBPR secondary to active infection.    Hypokalemia - administer K replacement IV - reassess  Hypermagnesemia -  Resolved on last check   Hypernatremia - Continue to monitor have been treating with fluids  Active Problems:   Rheumatoid arthritis ([place])   Hypertension - off antihypertensive medication with last reported BP at 137/44, will continue to monitor.    AKI (acute kidney injury) ([place]) - Most likely secondary to Sepsis although could be secondary to ATN secondary to either sepsis or hypotension. Another possibility is secondary to poor oral intake and prerenal causes.  - currently trending down, reassess today.  Hyperchloremia - reassess. Fluids changed    Diarrhea - none reported to me.    Protein-calorie malnutrition, severe - Will place on nutritional supplementation once patient is able to take food orally    Acute adrenal insufficiency ([place]) - will d/c solucortef in  lieu of active infection. Stress dosing for blood pressure completed.  Chronic diastolic HF - Will administer lasix today.   DVT prophylaxis: Heparin Code Status: DNR Family Communication: None at bedside Disposition Plan: pending improvement in condition   Consultants:   None   Procedures: None   Antimicrobials: Vancomycin, Zosyn   Subjective: Pt responsive to question but somnolent  Objective: Vitals:   11/11/16 2338 11/12/16 0400 11/12/16 0500 11/12/16 0700  BP: (!) 142/65 (!) 162/71 (!) 159/72 134/76  Pulse: 94 100 (!) 102 78  Resp:  (!) 27 19 (!) 25  Temp: 98.5 F (36.9 C) 97.8 F (36.6 C)  97.8 F (36.6 C)  TempSrc: Axillary Axillary  Oral  SpO2: 97% 94%  92%  Weight:  52.9 kg (116 lb 10 oz)    Height:        Intake/Output Summary (Last 24 hours) at 11/12/16 1034 Last data filed at 11/12/16 0547  Gross per 24 hour  Intake              350 ml  Output              375 ml  Net              -25 ml   Filed Weights   11/10/16 0300 11/11/16 0148 11/12/16 0400  Weight: 55 kg (121 lb 4.1 oz) 55.2 kg (121 lb 11.1 oz) 52.9 kg (116 lb 10 oz)    Examination:  General exam: somnolent, in nad Respiratory system: equal chest rise, no wheezes Cardiovascular system: S1 & S2 heard, RRR. Marland Kitchen Gastrointestinal system: Abdomen is nondistended, soft and nontender. No organomegaly or masses felt. Normal bowel sounds  heard. Central nervous system: difficult to assess due to limited cooperation, no facial asymmetry Extremities: no cyanosis Skin: warm and dry Psychiatry: unable to assess due to limited cooperation.  Data Reviewed: I have personally reviewed following labs and imaging studies  CBC:  Recent Labs Lab 10/29/2016 1240  11/07/16 0548 11/08/16 0504 11/09/16 0938 11/10/16 0531 11/10/16 1233 11/11/16 0950  WBC 16.6*  < > 21.5* 31.7* 25.2* 26.9*  --  33.3*  NEUTROABS 14.2*  --   --   --   --   --   --   --   HGB 9.6*  < > 10.8* 10.4* 8.9* 8.6* 5.6* 14.8    HCT 29.2*  < > 31.9* 31.4* 26.3* 26.7* 18.2* 42.6  MCV 85.6  < > 83.5 84.0 84.6 87.3  --  84.4  PLT 166  < > 166 169 138* 142*  --  100*  < > = values in this interval not displayed. Basic Metabolic Panel:  Recent Labs Lab 11/07/16 0341 11/07/16 0609 11/07/16 1835 11/08/16 0504 11/09/16 1245 11/09/16 1330 11/10/16 0531 11/11/16 0950  NA 150*  --   --  151* 153*  --  153* 158*  K 2.7*  --  3.5 3.1* 4.7  --  4.9 3.2*  CL 126*  --   --  128* >130*  --  >130* >130*  CO2 10*  --   --  9* <7*  --  8* 11*  GLUCOSE 78  --   --  157* 94  --  124* 113*  BUN 49*  --   --  52* 50*  --  50* 49*  CREATININE 4.15*  --   --  4.85* 4.69*  --  4.44* 4.09*  CALCIUM 7.6*  --   --  7.6* 8.1*  --  8.3* 8.8*  MG  --  1.4*  --  2.7*  --  2.3  --   --   PHOS  --  5.1*  --   --   --   --   --   --    GFR: Estimated Creatinine Clearance: 9.5 mL/min (by C-G formula based on SCr of 4.09 mg/dL (H)). Liver Function Tests:  Recent Labs Lab 11/07/2016 1240 11/11/16 0950  AST 23 72*  ALT 9* 87*  ALKPHOS 81 129*  BILITOT 0.4 1.2  PROT 4.7* 5.7*  ALBUMIN 2.2* 2.2*   No results for input(s): LIPASE, AMYLASE in the last 168 hours. No results for input(s): AMMONIA in the last 168 hours. Coagulation Profile:  Recent Labs Lab 11/18/2016 1808  INR 1.26   Cardiac Enzymes:  Recent Labs Lab 11/13/2016 1549  TROPONINI <0.03   BNP (last 3 results) No results for input(s): PROBNP in the last 8760 hours. HbA1C: No results for input(s): HGBA1C in the last 72 hours. CBG:  Recent Labs Lab 11/11/16 1632 11/11/16 2021 11/12/16 0009 11/12/16 0407 11/12/16 0757  GLUCAP 112* 90 97 80 81   Lipid Profile: No results for input(s): CHOL, HDL, LDLCALC, TRIG, CHOLHDL, LDLDIRECT in the last 72 hours. Thyroid Function Tests: No results for input(s): TSH, T4TOTAL, FREET4, T3FREE, THYROIDAB in the last 72 hours. Anemia Panel: No results for input(s): VITAMINB12, FOLATE, FERRITIN, TIBC, IRON, RETICCTPCT in the  last 72 hours. Sepsis Labs:  Recent Labs Lab 11/14/2016 1252 10/30/2016 1549 11/13/2016 1559 11/05/2016 1808 11/08/16 0504 11/10/16 0531  PROCALCITON  --  0.42  --  0.56 1.96 0.56  LATICACIDVEN 4.96*  --  1.98*  --   --   --  Recent Results (from the past 240 hour(s))  Urine culture     Status: Abnormal   Collection Time: 10/28/2016 12:09 PM  Result Value Ref Range Status   Specimen Description URINE, RANDOM  Final   Special Requests NONE  Final   Culture MULTIPLE SPECIES PRESENT, SUGGEST RECOLLECTION (A)  Final   Report Status 11/07/2016 FINAL  Final  Blood Culture (routine x 2)     Status: None   Collection Time: 10/21/2016 12:20 PM  Result Value Ref Range Status   Specimen Description BLOOD LEFT ANTECUBITAL  Final   Special Requests BOTTLES DRAWN AEROBIC AND ANAEROBIC 5CC  Final   Culture NO GROWTH 5 DAYS  Final   Report Status 11/11/2016 FINAL  Final  Blood Culture (routine x 2)     Status: None   Collection Time: 10/29/2016 12:40 PM  Result Value Ref Range Status   Specimen Description BLOOD LEFT HAND  Final   Special Requests BOTTLES DRAWN AEROBIC AND ANAEROBIC 5CC  Final   Culture NO GROWTH 5 DAYS  Final   Report Status 11/11/2016 FINAL  Final  C difficile quick scan w PCR reflex     Status: None   Collection Time: 11/03/2016 12:43 PM  Result Value Ref Range Status   C Diff antigen NEGATIVE NEGATIVE Final   C Diff toxin NEGATIVE NEGATIVE Final   C Diff interpretation No C. difficile detected.  Final  MRSA PCR Screening     Status: None   Collection Time: 11/13/2016  7:00 PM  Result Value Ref Range Status   MRSA by PCR NEGATIVE NEGATIVE Final    Comment:        The GeneXpert MRSA Assay (FDA approved for NASAL specimens only), is one component of a comprehensive MRSA colonization surveillance program. It is not intended to diagnose MRSA infection nor to guide or monitor treatment for MRSA infections.   Culture, Urine     Status: Abnormal   Collection Time: 11/09/16   7:57 AM  Result Value Ref Range Status   Specimen Description URINE, RANDOM  Final   Special Requests NONE  Final   Culture (A)  Final    >=100,000 COLONIES/mL ENTEROCOCCUS FAECALIS >=100,000 COLONIES/mL ENTEROBACTER SPECIES    Report Status 11/12/2016 FINAL  Final   Organism ID, Bacteria ENTEROCOCCUS FAECALIS (A)  Final   Organism ID, Bacteria ENTEROBACTER SPECIES (A)  Final      Susceptibility   Enterococcus faecalis - MIC*    AMPICILLIN <=2 SENSITIVE Sensitive     LEVOFLOXACIN 1 SENSITIVE Sensitive     NITROFURANTOIN <=16 SENSITIVE Sensitive     VANCOMYCIN 1 SENSITIVE Sensitive     * >=100,000 COLONIES/mL ENTEROCOCCUS FAECALIS   Enterobacter species - MIC*    CEFAZOLIN >=64 RESISTANT Resistant     CEFTRIAXONE 16 INTERMEDIATE Intermediate     CIPROFLOXACIN <=0.25 SENSITIVE Sensitive     GENTAMICIN <=1 SENSITIVE Sensitive     IMIPENEM 2 SENSITIVE Sensitive     NITROFURANTOIN 256 RESISTANT Resistant     TRIMETH/SULFA <=20 SENSITIVE Sensitive     PIP/TAZO >=128 RESISTANT Resistant     * >=100,000 COLONIES/mL ENTEROBACTER SPECIES     Radiology Studies: No results found. Scheduled Meds: . budesonide  9 mg Oral Daily  . chlorhexidine  15 mL Mouth Rinse BID  . levothyroxine  37.5 mcg Intravenous Daily  . mouth rinse  15 mL Mouth Rinse q12n4p  . methylPREDNISolone sodium succinate      . metronidazole  500 mg Intravenous  Q8H  . piperacillin-tazobactam (ZOSYN)  IV  2.25 g Intravenous Q8H  . sodium bicarbonate  650 mg Oral Daily  . sodium chloride flush  3 mL Intravenous Q12H   Continuous Infusions:    LOS: 6 days    Time spent: > 35 minutes  Velvet Bathe, MD [facility] Pager [phone]  If 7PM-7AM, please contact night-coverage [email or web address] Password [facility] 11/12/2016, 10:34 AM

## 2016-11-12 NOTE — Progress Notes (Signed)
Pharmacy Antibiotic Note  Olivia Mullen is a 81 y.o. female admitted on 11/13/2016 with sepsis due to unknown source. Pharmacy has been consulted to transition the patient from Zosyn to Meropenem.   Urine cultures resulted as Enterococcus (likely appropriately treated with 7d Zosyn) and Enterobacter (R-Zosyn). Tmax/24h: 99.2, WBC 33.3. SCr 4.09, CrCl~10 ml/min.    The patient was switched from Vanc po to Flagyl IV on 1/21 for continued CDiff treatment with poor po intake. CDiff PCR/toxin are noted to be negative this admission but the patient continues with have loose stools. Per discussion with MD - to continue Flagyl for now.   Plan: 1. D/c Zosyn 2. Start Meropenem 500 mg IV every 24 hours 3. Will continue to follow renal function, culture results, LOT, and antibiotic de-escalation plans   Height: 5\' 4"  (162.6 cm) Weight: 116 lb 10 oz (52.9 kg) IBW/kg (Calculated) : 54.7  Temp (24hrs), Avg:97.8 F (36.6 C), Min:97.4 F (36.3 C), Max:98.5 F (36.9 C)   Recent Labs Lab 11/10/2016 1252 11/08/2016 1559  11/07/16 0341 11/07/16 0548 11/08/16 0504 11/09/16 0938 11/09/16 1245 11/10/16 0531 11/11/16 0950  WBC  --   --   < >  --  21.5* 31.7* 25.2*  --  26.9* 33.3*  CREATININE  --   --   < > 4.15*  --  4.85*  --  4.69* 4.44* 4.09*  LATICACIDVEN 4.96* 1.98*  --   --   --   --   --   --   --   --   < > = values in this interval not displayed.  Estimated Creatinine Clearance: 9.5 mL/min (by C-G formula based on SCr of 4.09 mg/dL (H)).    Allergies  Allergen Reactions  . Gabapentin Itching  . Isosorbide Nitrate Itching  . Wellbutrin [Bupropion] Other (See Comments)    mental changes and increase anger  . Zonisamide Itching    Antimicrobials this admission:  Zosyn 1/17 >> 1/23 Vancomycin 1/17 >> 1/19 PTA Vanc PO 1/13 >> 1/23 Flagyl 1/21 >> Meropenem 1/23 >>  Microbiology results: 1/17 MRSA PCR: negative  1/17 blood cx: ngtd 1/17 C. Diff: negative 1/20 UCx: Enterococcus  (pan-S) + Enterobacter (S-bactrim/Cipro/Gent/Imi)  Thank you for allowing pharmacy to be a part of this patient's care.  Alycia Rossetti, PharmD, BCPS Clinical Pharmacist Pager: [phone] Clinical phone for 11/12/2016 from 7a-3:30p: [phone] If after 3:30p, please call main pharmacy at: [phone] 11/12/2016 11:30 AM

## 2016-11-12 NOTE — Progress Notes (Signed)
CRITICAL VALUE ALERT  Critical value received:  Sodium 166, Chloride >130 CBG 63  Date of notification:  11/12/16  Time of notification:  [id number]  Critical value read back yes  Nurse who received alert: Honor Loh RN  MD notified (1st page):  Dr Wendee Beavers  Time of first page:  1700  Responding MD:  Dr Wendee Beavers  Time MD responded:  [phone]

## 2016-11-12 NOTE — Progress Notes (Addendum)
Initial Nutrition Assessment  DOCUMENTATION CODES:   Severe malnutrition in context of chronic illness  INTERVENTION:    Once CORTRAK tube placed, initiate Jevity 1.2 formula at 20 ml/hr and increase by 10 ml every 4 hours to goal rate of 50 ml/hr  TF regimen to provide 1440 kcals, 67 gm protein, 968 ml of free water  NUTRITION DIAGNOSIS:   Inadequate oral intake related to inability to eat as evidenced by NPO status  GOAL:   Patient will meet greater than or equal to 90% of their needs  MONITOR:   TF tolerance, Diet advancement, Labs, Weight trends, I & O's  REASON FOR ASSESSMENT:   Consult, Low Braden Enteral/tube feeding initiation and management  ASSESSMENT:   81 y.o. Female with PMH significant of anxiety, breast cancer, CHF, depression, headaches, nephrolithiasis, hypertension, hypothyroidism, psoriasis, rheumatoid arthritis presenting from skilled nursing facility in an acutely altered state.   RD unable to obtain nutrition hx; pt confused. Pt resides at [facility]; was receiving Ensure Enlive per PTA medication list. S/p bedside swallow evaluation >> SLP rec NPO status with temporary nutrition support. Discussed with Dr. Wendee Beavers >> CORTRAK small bore feeding tube order placed. CBG's 80-81-76.  Nutrition-Focused physical exam completed. Findings are severe fat depletion, severe muscle depletion, and no edema.   Diet Order:  Diet NPO time specified Except for: Sips with Meds  Skin:  Reviewed, no issues  Last BM:  1/22  Height:   Ht Readings from Last 1 Encounters:  11/19/2016 5\' 4"  (1.626 m)    Weight:   Wt Readings from Last 1 Encounters:  11/12/16 116 lb 10 oz (52.9 kg)    Ideal Body Weight:  54.5 kg  BMI:  Body mass index is 20.02 kg/m.  Estimated Nutritional Needs:   Kcal:  1300-1500  Protein:  65-75 gm  Fluid:  </= 1.5 L  EDUCATION NEEDS:   No education needs identified at this time  Arthur Holms, RD, LDN Pager #: [phone] After-Hours  Pager #: [phone]

## 2016-11-12 NOTE — Progress Notes (Signed)
SLP Cancellation Note  Patient Details Name: Olivia Mullen MRN: [id number] DOB: 02-11-1936   Cancelled treatment:       Reason Eval/Treat Not Completed: Fatigue/lethargy limiting ability to participate.  Pt demonstrates no signs of improvement in mentation for PO readiness. Temporary means of alternate nutrition recommended.    Antanisha Mohs, Katherene Ponto 11/12/2016, 10:28 AM

## 2016-11-13 LAB — BASIC METABOLIC PANEL
BUN: 63 mg/dL — AB (ref 6–20)
CO2: 10 mmol/L — ABNORMAL LOW (ref 22–32)
Calcium: 8.7 mg/dL — ABNORMAL LOW (ref 8.9–10.3)
Creatinine, Ser: 3.96 mg/dL — ABNORMAL HIGH (ref 0.44–1.00)
GFR, EST AFRICAN AMERICAN: 12 mL/min — AB (ref >60)
GFR, EST NON AFRICAN AMERICAN: 10 mL/min — AB (ref >60)
Glucose, Bld: 187 mg/dL — ABNORMAL HIGH (ref 65–99)
POTASSIUM: 3.1 mmol/L — AB (ref 3.5–5.1)
Sodium: 166 mmol/L (ref 135–145)

## 2016-11-13 LAB — GLUCOSE, CAPILLARY
GLUCOSE-CAPILLARY: 183 mg/dL — AB (ref 65–99)
Glucose-Capillary: 169 mg/dL — ABNORMAL HIGH (ref 65–99)
Glucose-Capillary: 194 mg/dL — ABNORMAL HIGH (ref 65–99)
Glucose-Capillary: 169 mg/dL — ABNORMAL HIGH (ref 65–99)
Glucose-Capillary: 191 mg/dL — ABNORMAL HIGH (ref 65–99)

## 2016-11-13 LAB — CBC
HEMATOCRIT: 40.3 % (ref 36.0–46.0)
HEMOGLOBIN: 13.9 g/dL (ref 12.0–15.0)
MCH: 29.3 pg (ref 26.0–34.0)
MCHC: 34.5 g/dL (ref 30.0–36.0)
MCV: 85 fL (ref 78.0–100.0)
Platelets: 89 10*3/uL — ABNORMAL LOW (ref 150–400)
RBC: 4.74 MIL/uL (ref 3.87–5.11)
RDW: 18.8 % — AB (ref 11.5–15.5)
WBC: 22.3 10*3/uL — AB (ref 4.0–10.5)

## 2016-11-13 MED ORDER — LEVOTHYROXINE SODIUM 75 MCG PO TABS
75.0000 ug | ORAL_TABLET | Freq: Every day | ORAL | Status: DC
Start: 2016-11-14 — End: 2016-11-16
  Administered 2016-11-14 – 2016-11-16 (×3): 75 ug
  Filled 2016-11-13 (×3): qty 1

## 2016-11-13 MED ORDER — SODIUM CHLORIDE 0.9 % IV SOLN
30.0000 meq | Freq: Once | INTRAVENOUS | Status: AC
  Administered 2016-11-13: 30 meq via INTRAVENOUS
  Filled 2016-11-13: qty 15

## 2016-11-13 MED ORDER — SODIUM BICARBONATE 650 MG PO TABS
1300.0000 mg | ORAL_TABLET | Freq: Every day | ORAL | Status: DC
  Administered 2016-11-14: 1300 mg via ORAL
  Filled 2016-11-13: qty 2

## 2016-11-13 MED ORDER — SODIUM BICARBONATE 650 MG PO TABS: 1300.0000 mg | ORAL_TABLET | Freq: Every day | ORAL | Status: DC

## 2016-11-13 NOTE — Progress Notes (Signed)
SLP Cancellation Note  Patient Details Name: REIS FESMIRE MRN: [id number] DOB: May 16, 1936   Cancelled treatment:       Reason Eval/Treat Not Completed: Fatigue/lethargy limiting ability to participate. No improvement in mentation today. Cortrak in place. Will continue to follow for readiness for PO trials.    Sequoia Mincey, Katherene Ponto 11/13/2016, 10:10 AM

## 2016-11-13 NOTE — Progress Notes (Signed)
CRITICAL VALUE ALERT  Critical value received:  Na 166 and Cl >130  Date of notification:  11/13/16   Time of notification:  3:44 AM   Critical value read back:Yes.    Nurse who received alert:  Levonne Hubert  MD notified (1st page):  Schorr  Time of first page:  3:45 AM   MD notified (2nd page):  Time of second page:  Responding MD:    Time MD responded:

## 2016-11-13 NOTE — Progress Notes (Signed)
PROGRESS NOTE    Olivia Mullen  [id number] DOB: [birth year] DOA: 11/12/2016 PCP: [name], MD  Brief Narrative:  81 y.o. female with medical history significant of anxiety, breast cancer, CHF, depression, headaches, nephrolithiasis, hypertension, hypothyroidism, psoriasis, rheumatoid arthritis presenting from skilled nursing facility in an acutely altered state.   Assessment & Plan:      Sepsis ([place]) - Lactic acidosis resolving. - BP improving - Continue Zosyn. Will obtain urine from foley for urine culture. Given that patient has had antibiotic therapy since admission do not think blood cultures would be useful at this point. - Improving currently - blood culture negative - Urine culture growing enterobacter and enterococcus. Enterococcus treated now treating enterobacter related UTI.  Anemia - resolved after transfusion of 3 units of PRBC   C. difficile diarrhea - unable to take oral vancomycin per my discussion with nurse. Continue on IV flagyl - suspect intermittent BRBPR secondary to active infection.    Hypokalemia - administer K replacement IV again - reassess  Hypermagnesemia -  Resolved on last check   Hypernatremia - Continue to monitor have been treating with fluids  Active Problems:   Rheumatoid arthritis ([place])   Hypertension - If persistently elevated will add medication    AKI (acute kidney injury) ([place]) - Most likely secondary to Sepsis although could be secondary to ATN secondary to either sepsis or hypotension. Another possibility is secondary to poor oral intake and prerenal causes.  - currently trending down, reassess today.  Hyperchloremia - reassess. Fluids changed    Diarrhea - none reported to me.    Protein-calorie malnutrition, severe - Will place on nutritional supplementation once patient is able to take food orally    Acute adrenal insufficiency ([place]) - will d/c solucortef in lieu of active infection. Stress dosing for  blood pressure completed.  Chronic diastolic HF - Will administer lasix today.   DVT prophylaxis: Heparin Code Status: DNR Family Communication: None at bedside Disposition Plan: pending improvement in condition   Consultants:   None   Procedures: None   Antimicrobials: Vancomycin, Zosyn   Subjective: Pt has no new complaints. No acute issues overnight.  Objective: Vitals:   11/13/16 0500 11/13/16 0600 11/13/16 0758 11/13/16 1200  BP: (!) 164/78 (!) 161/85 (!) 155/75 (!) 161/74  Pulse: 100 (!) 102 (!) 109 (!) 113  Resp: (!) 22 (!) 21 19 (!) 22  Temp:   98.4 F (36.9 C) 98.5 F (36.9 C)  TempSrc:   Axillary Axillary  SpO2: 96% 95% 96% 95%  Weight:      Height:        Intake/Output Summary (Last 24 hours) at 11/13/16 1243 Last data filed at 11/13/16 1000  Gross per 24 hour  Intake          1185.83 ml  Output              375 ml  Net           810.83 ml   Filed Weights   11/11/16 0148 11/12/16 0400 11/13/16 0421  Weight: 55.2 kg (121 lb 11.1 oz) 52.9 kg (116 lb 10 oz) 52 kg (114 lb 10.2 oz)    Examination:  General exam: somnolent, in nad Respiratory system: equal chest rise, no wheezes Cardiovascular system: S1 & S2 heard, RRR. Marland Kitchen Gastrointestinal system: Abdomen is nondistended, soft and nontender. No organomegaly or masses felt. Normal bowel sounds heard. Central nervous system: difficult to assess due to limited cooperation, no facial asymmetry Extremities:  no cyanosis Skin: warm and dry Psychiatry: unable to assess due to limited cooperation.  Data Reviewed: I have personally reviewed following labs and imaging studies  CBC:  Recent Labs Lab 11/09/16 0938 11/10/16 0531 11/10/16 1233 11/11/16 0950 11/12/16 1530 11/13/16 0213  WBC 25.2* 26.9*  --  33.3* 29.8* 22.3*  HGB 8.9* 8.6* 5.6* 14.8 15.1* 13.9  HCT 26.3* 26.7* 18.2* 42.6 42.6 40.3  MCV 84.6 87.3  --  84.4 83.7 85.0  PLT 138* 142*  --  100* 76* 89*   Basic Metabolic  Panel:  Recent Labs Lab 11/07/16 0609  11/08/16 0504 11/09/16 1245 11/09/16 1330 11/10/16 0531 11/11/16 0950 11/12/16 1530 11/13/16 0213  NA  --   --  151* 153*  --  153* 158* 166* 166*  K  --   < > 3.1* 4.7  --  4.9 3.2* 3.4* 3.1*  CL  --   --  128* >130*  --  >130* >130* >130* >130*  CO2  --   --  9* <7*  --  8* 11* 12* 10*  GLUCOSE  --   --  157* 94  --  124* 113* 65 187*  BUN  --   --  52* 50*  --  50* 49* 57* 63*  CREATININE  --   --  4.85* 4.69*  --  4.44* 4.09* 3.90* 3.96*  CALCIUM  --   --  7.6* 8.1*  --  8.3* 8.8* 8.8* 8.7*  MG 1.4*  --  2.7*  --  2.3  --   --   --   --   PHOS 5.1*  --   --   --   --   --   --   --   --   < > = values in this interval not displayed. GFR: Estimated Creatinine Clearance: 9.6 mL/min (by C-G formula based on SCr of 3.96 mg/dL (H)). Liver Function Tests:  Recent Labs Lab 11/11/16 0950  AST 72*  ALT 87*  ALKPHOS 129*  BILITOT 1.2  PROT 5.7*  ALBUMIN 2.2*   No results for input(s): LIPASE, AMYLASE in the last 168 hours. No results for input(s): AMMONIA in the last 168 hours. Coagulation Profile:  Recent Labs Lab 10/31/2016 1808  INR 1.26   Cardiac Enzymes:  Recent Labs Lab 11/05/2016 1549  TROPONINI <0.03   BNP (last 3 results) No results for input(s): PROBNP in the last 8760 hours. HbA1C: No results for input(s): HGBA1C in the last 72 hours. CBG:  Recent Labs Lab 11/12/16 1748 11/12/16 1945 11/12/16 2328 11/13/16 0413 11/13/16 0800  GLUCAP 182* 170* 185* 169* 183*   Lipid Profile: No results for input(s): CHOL, HDL, LDLCALC, TRIG, CHOLHDL, LDLDIRECT in the last 72 hours. Thyroid Function Tests: No results for input(s): TSH, T4TOTAL, FREET4, T3FREE, THYROIDAB in the last 72 hours. Anemia Panel: No results for input(s): VITAMINB12, FOLATE, FERRITIN, TIBC, IRON, RETICCTPCT in the last 72 hours. Sepsis Labs:  Recent Labs Lab 11/15/2016 1252 11/14/2016 1549 11/12/2016 1559 10/30/2016 1808 11/08/16 0504  11/10/16 0531  PROCALCITON  --  0.42  --  0.56 1.96 0.56  LATICACIDVEN 4.96*  --  1.98*  --   --   --     Recent Results (from the past 240 hour(s))  Urine culture     Status: Abnormal   Collection Time: 11/13/2016 12:09 PM  Result Value Ref Range Status   Specimen Description URINE, RANDOM  Final   Special Requests NONE  Final  Culture MULTIPLE SPECIES PRESENT, SUGGEST RECOLLECTION (A)  Final   Report Status 11/07/2016 FINAL  Final  Blood Culture (routine x 2)     Status: None   Collection Time: 10/26/2016 12:20 PM  Result Value Ref Range Status   Specimen Description BLOOD LEFT ANTECUBITAL  Final   Special Requests BOTTLES DRAWN AEROBIC AND ANAEROBIC 5CC  Final   Culture NO GROWTH 5 DAYS  Final   Report Status 11/11/2016 FINAL  Final  Blood Culture (routine x 2)     Status: None   Collection Time: 11/02/2016 12:40 PM  Result Value Ref Range Status   Specimen Description BLOOD LEFT HAND  Final   Special Requests BOTTLES DRAWN AEROBIC AND ANAEROBIC 5CC  Final   Culture NO GROWTH 5 DAYS  Final   Report Status 11/11/2016 FINAL  Final  C difficile quick scan w PCR reflex     Status: None   Collection Time: 11/17/2016 12:43 PM  Result Value Ref Range Status   C Diff antigen NEGATIVE NEGATIVE Final   C Diff toxin NEGATIVE NEGATIVE Final   C Diff interpretation No C. difficile detected.  Final  MRSA PCR Screening     Status: None   Collection Time: 11/01/2016  7:00 PM  Result Value Ref Range Status   MRSA by PCR NEGATIVE NEGATIVE Final    Comment:        The GeneXpert MRSA Assay (FDA approved for NASAL specimens only), is one component of a comprehensive MRSA colonization surveillance program. It is not intended to diagnose MRSA infection nor to guide or monitor treatment for MRSA infections.   Culture, Urine     Status: Abnormal   Collection Time: 11/09/16  7:57 AM  Result Value Ref Range Status   Specimen Description URINE, RANDOM  Final   Special Requests NONE  Final    Culture (A)  Final    >=100,000 COLONIES/mL ENTEROCOCCUS FAECALIS >=100,000 COLONIES/mL ENTEROBACTER SPECIES    Report Status 11/12/2016 FINAL  Final   Organism ID, Bacteria ENTEROCOCCUS FAECALIS (A)  Final   Organism ID, Bacteria ENTEROBACTER SPECIES (A)  Final      Susceptibility   Enterococcus faecalis - MIC*    AMPICILLIN <=2 SENSITIVE Sensitive     LEVOFLOXACIN 1 SENSITIVE Sensitive     NITROFURANTOIN <=16 SENSITIVE Sensitive     VANCOMYCIN 1 SENSITIVE Sensitive     * >=100,000 COLONIES/mL ENTEROCOCCUS FAECALIS   Enterobacter species - MIC*    CEFAZOLIN >=64 RESISTANT Resistant     CEFTRIAXONE 16 INTERMEDIATE Intermediate     CIPROFLOXACIN <=0.25 SENSITIVE Sensitive     GENTAMICIN <=1 SENSITIVE Sensitive     IMIPENEM 2 SENSITIVE Sensitive     NITROFURANTOIN 256 RESISTANT Resistant     TRIMETH/SULFA <=20 SENSITIVE Sensitive     PIP/TAZO >=128 RESISTANT Resistant     * >=100,000 COLONIES/mL ENTEROBACTER SPECIES     Radiology Studies: Dg Abd Portable 1v  Result Date: 11/12/2016 CLINICAL DATA:  Check feeding catheter placement EXAM: PORTABLE ABDOMEN - 1 VIEW COMPARISON:  None. FINDINGS: Scattered large and small bowel gas is noted. A feeding catheter is noted coiled within the stomach with the tip directed towards the fundus. Degenerative changes of lumbar spine are seen. Postsurgical changes in the right hip are noted. IMPRESSION: Feeding catheter as described. Electronically Signed   By: Inez Catalina M.D.   On: 11/12/2016 15:56   Scheduled Meds: . budesonide  9 mg Oral Daily  . chlorhexidine  15 mL Mouth  Rinse BID  . [START ON 11/14/2016] levothyroxine  75 mcg Per Tube QAC breakfast  . mouth rinse  15 mL Mouth Rinse q12n4p  . meropenem (MERREM) IV  500 mg Intravenous Q24H  . metronidazole  500 mg Intravenous Q8H  . sodium bicarbonate  650 mg Oral Daily  . sodium chloride flush  3 mL Intravenous Q12H   Continuous Infusions: . feeding supplement (JEVITY 1.2 CAL) 50 mL/hr at  11/12/16 1717     LOS: 7 days    Time spent: > 35 minutes  Velvet Bathe, MD [facility] Pager [phone]  If 7PM-7AM, please contact night-coverage [email or web address] Password [facility] 11/13/2016, 12:43 PM

## 2016-11-13 NOTE — Consult Note (Signed)
   [facility] Inpatient Consult   11/13/2016  Olivia Mullen 01-Aug-1936 [id number]   Patient has been active with [facility] while being followed by [facility] social worker while a patient at [facility] for post facility planning.  Please see [facility]'s CSW notes for details.  Chart review reveals the patient is a 81 y.o.femalewith medical history significant of anxiety, breast cancer, CHF, depression, headaches, nephrolithiasis, hypertension, hypothyroidism, psoriasis, rheumatoid arthritis presenting from skilled nursing facility in an acutely altered state on 11/14/2016. Chart reviewed for progress, patient remains lethargic at this point.  Will follow up for progress or changes.  For questions, please contact:  Natividad Brood, RN BSN [facility] Liaison  [phone] business mobile phone Toll free office [phone]

## 2016-11-14 ENCOUNTER — Inpatient Hospital Stay (HOSPITAL_COMMUNITY): Payer: Medicare Other

## 2016-11-14 ENCOUNTER — Other Ambulatory Visit: Payer: Self-pay | Admitting: *Deleted

## 2016-11-14 LAB — GLUCOSE, CAPILLARY
GLUCOSE-CAPILLARY: 174 mg/dL — AB (ref 65–99)
GLUCOSE-CAPILLARY: 278 mg/dL — AB (ref 65–99)
Glucose-Capillary: 174 mg/dL — ABNORMAL HIGH (ref 65–99)
Glucose-Capillary: 198 mg/dL — ABNORMAL HIGH (ref 65–99)
Glucose-Capillary: 286 mg/dL — ABNORMAL HIGH (ref 65–99)
Glucose-Capillary: 295 mg/dL — ABNORMAL HIGH (ref 65–99)
Glucose-Capillary: 307 mg/dL — ABNORMAL HIGH (ref 65–99)

## 2016-11-14 LAB — BASIC METABOLIC PANEL
BUN: 82 mg/dL — AB (ref 6–20)
BUN: 84 mg/dL — ABNORMAL HIGH (ref 6–20)
BUN: 84 mg/dL — ABNORMAL HIGH (ref 6–20)
CO2: 12 mmol/L — ABNORMAL LOW (ref 22–32)
CO2: 10 mmol/L — ABNORMAL LOW (ref 22–32)
CO2: 12 mmol/L — ABNORMAL LOW (ref 22–32)
Calcium: 8.5 mg/dL — ABNORMAL LOW (ref 8.9–10.3)
Calcium: 8.4 mg/dL — ABNORMAL LOW (ref 8.9–10.3)
Calcium: 7.7 mg/dL — ABNORMAL LOW (ref 8.9–10.3)
Chloride: >130 mmol/L (ref 101–111)
Chloride: >130 mmol/L (ref 101–111)
Creatinine, Ser: 3.89 mg/dL — ABNORMAL HIGH (ref 0.44–1.00)
Creatinine, Ser: 3.7 mg/dL — ABNORMAL HIGH (ref 0.44–1.00)
Creatinine, Ser: 3.5 mg/dL — ABNORMAL HIGH (ref 0.44–1.00)
GFR calc Af Amer: 12 mL/min — ABNORMAL LOW (ref >60)
GFR calc Af Amer: 13 mL/min — ABNORMAL LOW (ref >60)
GFR calc Af Amer: 13 mL/min — ABNORMAL LOW (ref >60)
GFR calc non Af Amer: 10 mL/min — ABNORMAL LOW (ref >60)
GFR calc non Af Amer: 11 mL/min — ABNORMAL LOW (ref >60)
GFR calc non Af Amer: 12 mL/min — ABNORMAL LOW (ref >60)
GLUCOSE: 225 mg/dL — AB (ref 65–99)
Glucose, Bld: 264 mg/dL — ABNORMAL HIGH (ref 65–99)
Glucose, Bld: 346 mg/dL — ABNORMAL HIGH (ref 65–99)
POTASSIUM: 3.4 mmol/L — AB (ref 3.5–5.1)
Potassium: 4.1 mmol/L (ref 3.5–5.1)
Potassium: 3 mmol/L — ABNORMAL LOW (ref 3.5–5.1)
Sodium: 172 mmol/L (ref 135–145)
Sodium: 170 mmol/L (ref 135–145)
Sodium: 163 mmol/L (ref 135–145)

## 2016-11-14 LAB — CBC
HEMATOCRIT: 40 % (ref 36.0–46.0)
HEMOGLOBIN: 13.6 g/dL (ref 12.0–15.0)
MCH: 29 pg (ref 26.0–34.0)
MCHC: 34 g/dL (ref 30.0–36.0)
MCV: 85.3 fL (ref 78.0–100.0)
Platelets: 100 10*3/uL — ABNORMAL LOW (ref 150–400)
RBC: 4.69 MIL/uL (ref 3.87–5.11)
RDW: 19.4 % — AB (ref 11.5–15.5)
WBC: 27.8 10*3/uL — ABNORMAL HIGH (ref 4.0–10.5)

## 2016-11-14 LAB — BRAIN NATRIURETIC PEPTIDE: B Natriuretic Peptide: 795.1 pg/mL — ABNORMAL HIGH (ref 0.0–100.0)

## 2016-11-14 MED ORDER — SODIUM BICARBONATE 650 MG PO TABS
1300.0000 mg | ORAL_TABLET | Freq: Three times a day (TID) | ORAL | Status: DC
  Administered 2016-11-14 – 2016-11-15 (×5): 1300 mg
  Filled 2016-11-14 (×5): qty 2

## 2016-11-14 MED ORDER — FREE WATER: 200.0000 mL | Freq: Three times a day (TID) | Status: DC

## 2016-11-14 MED ORDER — DEXTROSE 5 % IV SOLN
INTRAVENOUS | Status: DC
Start: 2016-11-14 — End: 2016-11-16
  Administered 2016-11-14 – 2016-11-16 (×4): via INTRAVENOUS

## 2016-11-14 MED ORDER — INSULIN ASPART 100 UNIT/ML ~~LOC~~ SOLN: 0.0000 [IU] | Freq: Three times a day (TID) | SUBCUTANEOUS | Status: DC

## 2016-11-14 MED ORDER — FREE WATER
200.0000 mL | Status: DC
  Administered 2016-11-14 – 2016-11-16 (×11): 200 mL

## 2016-11-14 MED ORDER — INSULIN ASPART 100 UNIT/ML ~~LOC~~ SOLN
0.0000 [IU] | Freq: Three times a day (TID) | SUBCUTANEOUS | Status: DC
  Administered 2016-11-14: 7 [IU] via SUBCUTANEOUS
  Administered 2016-11-14: 5 [IU] via SUBCUTANEOUS
  Administered 2016-11-15: 3 [IU] via SUBCUTANEOUS
  Administered 2016-11-15: 5 [IU] via SUBCUTANEOUS

## 2016-11-14 MED ORDER — DEXTROSE 5 % IV SOLN
INTRAVENOUS | Status: DC
Start: 2016-11-14 — End: 2016-11-14
  Administered 2016-11-14: 07:00:00 via INTRAVENOUS

## 2016-11-14 NOTE — Progress Notes (Signed)
PROGRESS NOTE    Olivia Mullen  [id number] DOB: May 09, 1936 DOA: 11/15/2016 PCP: [name], MD  Brief Narrative:  81 y.o. female with medical history significant of anxiety, breast cancer, CHF, depression, headaches, nephrolithiasis, hypertension, hypothyroidism, psoriasis, rheumatoid arthritis presenting from skilled nursing facility in an acutely altered state.   Assessment & Plan:      Sepsis ([place]) - Lactic acidosis resolving. - BP improving - Continue Zosyn. Will obtain urine from foley for urine culture.  - Improving currently - blood culture negative - Urine culture growing enterobacter and enterococcus. Enterococcus treated now treating enterobacter related UTI.  Anemia - resolved after transfusion of 3 units of PRBC   C. difficile diarrhea - unable to take oral vancomycin per my discussion with nurse. Continue on IV flagyl - suspect intermittent BRBPR secondary to active infection.    Hypokalemia - administer K replacement IV again - reassess  Hypermagnesemia -  Resolved on last check   Hypernatremia - I suspect patient is still dry. She has dry mucous membranes and her lungs do not sound "wet" on exam. I will increase the free fluid she gets via tube and place her on d5 w  Hyperglycemia - place on SSI sensitive scale  Active Problems:   Rheumatoid arthritis ([place])   Hypertension - If persistently elevated will add medication    AKI (acute kidney injury) ([place]) - Most likely secondary to Sepsis although could be secondary to ATN secondary to either sepsis or hypotension. Another possibility is secondary to poor oral intake and prerenal causes.  - currently trending down, Will reassess next am. Will administer more fluids today.  Hyperchloremia - change fluids. Add free water. Suspect diarrhea having effect    Diarrhea - none reported to me.  Hypothyroidism - patient is back on synthroid IV    Protein-calorie malnutrition, severe - Will  place on nutritional supplementation once patient is able to take food orally    Acute adrenal insufficiency ([place]) - will d/c solucortef in lieu of active infection. Stress dosing for blood pressure completed.  Chronic diastolic HF - Will administer lasix today.   DVT prophylaxis: Heparin Code Status: DNR Family Communication: None at bedside Disposition Plan: pending improvement in condition   Consultants:   None   Procedures: None   Antimicrobials: Vancomycin, Zosyn   Subjective: Pt has no new complaints. No acute issues overnight.  Objective: Vitals:   11/14/16 0000 11/14/16 0300 11/14/16 0357 11/14/16 0700  BP: (!) 143/72 (!) 150/81  137/74  Pulse: (!) 109 (!) 122  (!) 125  Resp: (!) 24 (!) 27  (!) 29  Temp: 99.7 F (37.6 C) 99.6 F (37.6 C)  97.4 F (36.3 C)  TempSrc: Axillary Axillary  Axillary  SpO2: 95% 96%  94%  Weight:   54.5 kg (120 lb 2.4 oz)   Height:        Intake/Output Summary (Last 24 hours) at 11/14/16 1215 Last data filed at 11/14/16 0700  Gross per 24 hour  Intake             2305 ml  Output             1275 ml  Net             1030 ml   Filed Weights   11/12/16 0400 11/13/16 0421 11/14/16 0357  Weight: 52.9 kg (116 lb 10 oz) 52 kg (114 lb 10.2 oz) 54.5 kg (120 lb 2.4 oz)    Examination:  General exam:  somnolent, in nad Respiratory system: equal chest rise, no wheezes Cardiovascular system: S1 & S2 heard, RRR. Marland Kitchen Gastrointestinal system: Abdomen is nondistended, soft and nontender. No organomegaly or masses felt. Normal bowel sounds heard. Central nervous system: difficult to assess due to limited cooperation, no facial asymmetry Extremities: no cyanosis Skin: warm and dry Psychiatry: unable to assess due to limited cooperation.  Data Reviewed: I have personally reviewed following labs and imaging studies  CBC:  Recent Labs Lab 11/10/16 0531 11/10/16 1233 11/11/16 0950 11/12/16 1530 11/13/16 0213 11/14/16 0227  WBC  26.9*  --  33.3* 29.8* 22.3* 27.8*  HGB 8.6* 5.6* 14.8 15.1* 13.9 13.6  HCT 26.7* 18.2* 42.6 42.6 40.3 40.0  MCV 87.3  --  84.4 83.7 85.0 85.3  PLT 142*  --  100* 76* 89* 123XX123*   Basic Metabolic Panel:  Recent Labs Lab 11/08/16 0504  11/09/16 1330  11/11/16 0950 11/12/16 1530 11/13/16 0213 11/14/16 0227 11/14/16 1036  NA 151*  < >  --   < > 158* 166* 166* 172* 170*  K 3.1*  < >  --   < > 3.2* 3.4* 3.1* 3.4* 4.1  CL 128*  < >  --   < > >130* >130* >130* >130* >130*  CO2 9*  < >  --   < > 11* 12* 10* 12* 10*  GLUCOSE 157*  < >  --   < > 113* 65 187* 225* 264*  BUN 52*  < >  --   < > 49* 57* 63* 82* 84*  CREATININE 4.85*  < >  --   < > 4.09* 3.90* 3.96* 3.89* 3.70*  CALCIUM 7.6*  < >  --   < > 8.8* 8.8* 8.7* 8.5* 8.4*  MG 2.7*  --  2.3  --   --   --   --   --   --   < > = values in this interval not displayed. GFR: Estimated Creatinine Clearance: 10.8 mL/min (by C-G formula based on SCr of 3.7 mg/dL (H)). Liver Function Tests:  Recent Labs Lab 11/11/16 0950  AST 72*  ALT 87*  ALKPHOS 129*  BILITOT 1.2  PROT 5.7*  ALBUMIN 2.2*   No results for input(s): LIPASE, AMYLASE in the last 168 hours. No results for input(s): AMMONIA in the last 168 hours. Coagulation Profile: No results for input(s): INR, PROTIME in the last 168 hours. Cardiac Enzymes: No results for input(s): CKTOTAL, CKMB, CKMBINDEX, TROPONINI in the last 168 hours. BNP (last 3 results) No results for input(s): PROBNP in the last 8760 hours. HbA1C: No results for input(s): HGBA1C in the last 72 hours. CBG:  Recent Labs Lab 11/13/16 1619 11/13/16 1953 11/14/16 0020 11/14/16 0411 11/14/16 0746  GLUCAP 169* 191* 174* 198* 174*   Lipid Profile: No results for input(s): CHOL, HDL, LDLCALC, TRIG, CHOLHDL, LDLDIRECT in the last 72 hours. Thyroid Function Tests: No results for input(s): TSH, T4TOTAL, FREET4, T3FREE, THYROIDAB in the last 72 hours. Anemia Panel: No results for input(s): VITAMINB12,  FOLATE, FERRITIN, TIBC, IRON, RETICCTPCT in the last 72 hours. Sepsis Labs:  Recent Labs Lab 11/08/16 0504 11/10/16 0531  PROCALCITON 1.96 0.56    Recent Results (from the past 240 hour(s))  Urine culture     Status: Abnormal   Collection Time: 10/31/2016 12:09 PM  Result Value Ref Range Status   Specimen Description URINE, RANDOM  Final   Special Requests NONE  Final   Culture MULTIPLE SPECIES PRESENT, SUGGEST RECOLLECTION (  A)  Final   Report Status 11/07/2016 FINAL  Final  Blood Culture (routine x 2)     Status: None   Collection Time: 10/31/2016 12:20 PM  Result Value Ref Range Status   Specimen Description BLOOD LEFT ANTECUBITAL  Final   Special Requests BOTTLES DRAWN AEROBIC AND ANAEROBIC 5CC  Final   Culture NO GROWTH 5 DAYS  Final   Report Status 11/11/2016 FINAL  Final  Blood Culture (routine x 2)     Status: None   Collection Time: 11/13/2016 12:40 PM  Result Value Ref Range Status   Specimen Description BLOOD LEFT HAND  Final   Special Requests BOTTLES DRAWN AEROBIC AND ANAEROBIC 5CC  Final   Culture NO GROWTH 5 DAYS  Final   Report Status 11/11/2016 FINAL  Final  C difficile quick scan w PCR reflex     Status: None   Collection Time: 11/10/2016 12:43 PM  Result Value Ref Range Status   C Diff antigen NEGATIVE NEGATIVE Final   C Diff toxin NEGATIVE NEGATIVE Final   C Diff interpretation No C. difficile detected.  Final  MRSA PCR Screening     Status: None   Collection Time: 11/17/2016  7:00 PM  Result Value Ref Range Status   MRSA by PCR NEGATIVE NEGATIVE Final    Comment:        The GeneXpert MRSA Assay (FDA approved for NASAL specimens only), is one component of a comprehensive MRSA colonization surveillance program. It is not intended to diagnose MRSA infection nor to guide or monitor treatment for MRSA infections.   Culture, Urine     Status: Abnormal   Collection Time: 11/09/16  7:57 AM  Result Value Ref Range Status   Specimen Description URINE, RANDOM   Final   Special Requests NONE  Final   Culture (A)  Final    >=100,000 COLONIES/mL ENTEROCOCCUS FAECALIS >=100,000 COLONIES/mL ENTEROBACTER SPECIES    Report Status 11/12/2016 FINAL  Final   Organism ID, Bacteria ENTEROCOCCUS FAECALIS (A)  Final   Organism ID, Bacteria ENTEROBACTER SPECIES (A)  Final      Susceptibility   Enterococcus faecalis - MIC*    AMPICILLIN <=2 SENSITIVE Sensitive     LEVOFLOXACIN 1 SENSITIVE Sensitive     NITROFURANTOIN <=16 SENSITIVE Sensitive     VANCOMYCIN 1 SENSITIVE Sensitive     * >=100,000 COLONIES/mL ENTEROCOCCUS FAECALIS   Enterobacter species - MIC*    CEFAZOLIN >=64 RESISTANT Resistant     CEFTRIAXONE 16 INTERMEDIATE Intermediate     CIPROFLOXACIN <=0.25 SENSITIVE Sensitive     GENTAMICIN <=1 SENSITIVE Sensitive     IMIPENEM 2 SENSITIVE Sensitive     NITROFURANTOIN 256 RESISTANT Resistant     TRIMETH/SULFA <=20 SENSITIVE Sensitive     PIP/TAZO >=128 RESISTANT Resistant     * >=100,000 COLONIES/mL ENTEROBACTER SPECIES     Radiology Studies: Dg Chest Port 1 View  Result Date: 11/14/2016 CLINICAL DATA:  CHF . EXAM: PORTABLE CHEST 1 VIEW COMPARISON:  11/10/2016 . FINDINGS: Feeding tube noted with tip below left hemidiaphragm. Heart size normal. Low lung volumes. Left base infiltrate suggesting pneumonia. No pleural effusion or pneumothorax. Left shoulder replacement. Surgical clips right chest and lower neck. Mediastinum hilar structures are normal. IMPRESSION: 1.  Feeding tube noted with tip below left hemidiaphragm. 2. Low lung volumes. Left base infiltrate suggesting pneumonia noted . Electronically Signed   By: Marcello Moores  Register   On: 11/14/2016 10:59   Dg Abd Portable 1v  Result Date:  11/12/2016 CLINICAL DATA:  Check feeding catheter placement EXAM: PORTABLE ABDOMEN - 1 VIEW COMPARISON:  None. FINDINGS: Scattered large and small bowel gas is noted. A feeding catheter is noted coiled within the stomach with the tip directed towards the fundus.  Degenerative changes of lumbar spine are seen. Postsurgical changes in the right hip are noted. IMPRESSION: Feeding catheter as described. Electronically Signed   By: Inez Catalina M.D.   On: 11/12/2016 15:56   Scheduled Meds: . budesonide  9 mg Oral Daily  . chlorhexidine  15 mL Mouth Rinse BID  . free water  200 mL Per Tube Q8H  . levothyroxine  75 mcg Per Tube QAC breakfast  . mouth rinse  15 mL Mouth Rinse q12n4p  . meropenem (MERREM) IV  500 mg Intravenous Q24H  . metronidazole  500 mg Intravenous Q8H  . sodium bicarbonate  1,300 mg Oral Daily  . sodium chloride flush  3 mL Intravenous Q12H   Continuous Infusions: . dextrose 100 mL/hr at 11/14/16 1054  . feeding supplement (JEVITY 1.2 CAL) 1,000 mL (11/14/16 0631)     LOS: 8 days    Time spent: > 35 minutes  Velvet Bathe, MD [facility] Pager [phone]  If 7PM-7AM, please contact night-coverage [email or web address] Password [facility] 11/14/2016, 12:15 PM

## 2016-11-14 NOTE — Care Management Important Message (Signed)
Important Message  Patient Details  Name: Olivia Mullen MRN: [id number] Date of Birth: October 17, 1936   Medicare Important Message Given:  Other (see comment)    [place], [name] [name] 11/14/2016, 11:32 AM

## 2016-11-14 NOTE — Patient Outreach (Signed)
[facility]) Care Management  11/14/2016  Olivia Mullen May 08, 1936 [id number]   CSW was scheduled to meet with patient today at [facility] [place] where patient was residing to receive short-term rehabilitative services.  However, CSW had to cancel the visit with patient, due to patient being hospitalized on January 17th.  Patient presented to the Emergency Department with Altered Mental Status, Lethargy, Mottled Torso and a Blood Pressure of 85/32.  After thorough review of patient's EMR (Electronic Medical Record) in [employer], CSW noted that patient remains Lethargic and in an acutely altered state.  CSW will continue to follow patient while residing in the hospital. Nat Christen, BSW, MSW, [place]  Licensed Clinical Social Worker  [place]  Mailing [place] [address], [place], [place] [address] Physical Address-[address], [place], [place] [address] Toll Free Main # [phone] Fax # [phone] Cell # [phone]  Office # [phone] [email or web address]

## 2016-11-14 NOTE — Consult Note (Signed)
Olivia Mullen is an 81 y.o. female referred by Dr Wendee Beavers   Chief Complaint: ARF, hypernatremia, metabolic acidosis HPI: [name] admitted 11/04/2016 from [facility] for decreased responsiveness with poor PO intake.  She was found to have ARF and marked metabolic acidosis.  Scr 4.42 on admission with last Scr 11/17 that was 1.11.  Today Scr down to 3.7.  Bicarb was <7 with NL AG and today 10.  SNa which was NL has risen to 170.  I/O's not complete.  Pt unable to give hx as she is obtunded.  UC grew enterococcus.  She has recently been treated for C diff PTA and C diff neg so far since admission.  First BP recorded in Er was SBP 67.  BP better now  Past Medical History:  Diagnosis Date  . Anemia   . Anginal pain ([place])    long time ago  . Anxiety   . Cancer ([place])    right breast, 20 years ago  . CHF (congestive heart failure) ([place])   . Chronic systolic heart failure ([place])   . Colitis, collagenous   . Complication of anesthesia "long time ago"   one time woke up during surgery   . Depression   . Headache(784.0)    migraines  . History of kidney stones long time ago  . Hypercholesteremia    "doctor wants to get down to 100, now its 108"  . Hypertension   . Hypothyroidism (acquired) 12/16/2011  . Multinodular goiter (nontoxic) 12/16/2011  . Normochromic normocytic anemia 12/16/2011  . Pneumonia    hx of, years ago  . Psoriasis   . Rheumatoid arthritis(714.0) 12/16/2011  . UTI (lower urinary tract infection)     Past Surgical History:  Procedure Laterality Date  . ABDOMINAL HYSTERECTOMY    . APPENDECTOMY    . BREAST SURGERY  20 years ago   reduced left breast, reconstruction  . CARPAL TUNNEL RELEASE Right   . CATARACT EXTRACTION Bilateral   . CHOLECYSTECTOMY    . ESOPHAGOGASTRODUODENOSCOPY (EGD) WITH PROPOFOL N/A 03/12/2016   Procedure: ESOPHAGOGASTRODUODENOSCOPY (EGD) WITH PROPOFOL;  Surgeon: [name], MD;  Location: [facility] ENDOSCOPY;  Service: Endoscopy;  Laterality: N/A;  . left shoulder      "replaced joint with rod"  . MASTECTOMY     right  . SKIN GRAFT Right    , Calf  . THYROID SURGERY    . TONSILLECTOMY    . TOTAL HIP ARTHROPLASTY Right 10/11/2013   Procedure: RIGHT TOTAL HIP ARTHROPLASTY ANTERIOR APPROACH;  Surgeon: Gearlean Alf, MD;  Location: [facility] ORS;  Service: Orthopedics;  Laterality: Right;    Family History  Problem Relation Age of Onset  . Heart disease Mother   . Healthy Sister    Social History:  reports that she quit smoking about 24 years ago. Her smoking use included Cigarettes. She smoked 3.00 packs per day. She has never used smokeless tobacco. She reports that she drinks alcohol. She reports that she does not use drugs.  Allergies:  Allergies  Allergen Reactions  . Gabapentin Itching  . Isosorbide Nitrate Itching  . Wellbutrin [Bupropion] Other (See Comments)    mental changes and increase anger  . Zonisamide Itching    Medications Prior to Admission  Medication Sig Dispense Refill  . acetaminophen (TYLENOL) 500 MG tablet Take 1,000 mg by mouth 3 (three) times daily. TOTAL FROM ALL SOURCES OF ACETAMINOPHEN NOT TO EXCEED 4,000 MG IN 24 HOURS    . budesonide (ENTOCORT EC) 3  MG 24 hr capsule Take 3 capsules (9 mg total) by mouth daily. 30 capsule 0  . butalbital-acetaminophen-caffeine (FIORICET, ESGIC) 50-325-40 MG tablet Take 1 tablet by mouth 2 (two) times daily as needed for headache.    . carvedilol (COREG) 6.25 MG tablet TAKE 1 TABLET (6.25 MG TOTAL) BY MOUTH 2 (TWO) TIMES DAILY WITH A MEAL. 60 tablet 10  . cholecalciferol (VITAMIN D) 1000 UNITS tablet Take 2,000 Units by mouth daily.     . clonazePAM (KLONOPIN) 0.5 MG tablet Take 0.5 mg by mouth at bedtime.     Marland Kitchen doxepin (SINEQUAN) 100 MG capsule Take 100 mg by mouth at bedtime.    . folic acid (FOLVITE) 1 MG tablet Take 1 mg by mouth daily.     . furosemide (LASIX) 20 MG tablet Take 20 mg by mouth daily.    Marland Kitchen glycopyrrolate (ROBINUL) 2 MG tablet Take 2 mg by mouth 2 (two) times daily.   6  . KLOR-CON M20 20 MEQ tablet Take 40 mEq by mouth 2 (two) times daily.     Marland Kitchen lactose free nutrition (BOOST) LIQD Take 237 mLs by mouth 3 (three) times daily.    Marland Kitchen leflunomide (ARAVA) 20 MG tablet Take 20 mg by mouth daily.    Marland Kitchen levothyroxine (SYNTHROID, LEVOTHROID) 75 MCG tablet Take 75 mcg by mouth daily before breakfast.    . loperamide (IMODIUM) 2 MG capsule Take 1 capsule (2 mg total) by mouth as needed for diarrhea or loose stools. 30 capsule 0  . losartan (COZAAR) 25 MG tablet Take 25 mg by mouth daily.    . pantoprazole (PROTONIX) 40 MG tablet Take 1 tablet (40 mg total) by mouth 2 (two) times daily. 60 tablet 3  . Probiotic Product (PROBIOTIC DAILY PO) Take 2 capsules by mouth daily.     . rizatriptan (MAXALT) 10 MG tablet Take 1 tablet by mouth every 2 (two) hours as needed for migraine. MAX 3 TABLETS/DAY    . sertraline (ZOLOFT) 50 MG tablet Take 50 mg by mouth every morning.    . simvastatin (ZOCOR) 10 MG tablet Take 10 mg by mouth daily.    . vancomycin (VANCOCIN) 125 MG capsule Take 125 mg by mouth 4 (four) times daily. FOR 10 DAYS    . cephALEXin (KEFLEX) 500 MG capsule Take 1 capsule (500 mg total) by mouth 2 (two) times daily. (Patient not taking: Reported on 11/05/2016) 14 capsule 0  . feeding supplement, ENSURE ENLIVE, (ENSURE ENLIVE) LIQD Take 237 mLs by mouth 2 (two) times daily between meals. (Patient not taking: Reported on 11/18/2016) 237 mL 12  . HUMIRA PEN 40 MG/0.8ML injection Inject 40 mg into the skin every 14 (fourteen) days.     . Multiple Vitamins-Minerals (ADULT GUMMY PO) Take 2 tablets by mouth daily.       Lab Results: UA: 6-30 RBC's, TNTC WBC's  30 protein  Recent Labs  11/12/16 1530 11/13/16 0213 11/14/16 0227  WBC 29.8* 22.3* 27.8*  HGB 15.1* 13.9 13.6  HCT 42.6 40.3 40.0  PLT 76* 89* 100*   BMET  Recent Labs  11/13/16 0213 11/14/16 0227 11/14/16 1036  NA 166* 172* 170*  K 3.1* 3.4* 4.1  CL >130* >130* >130*  CO2 10* 12* 10*  GLUCOSE  187* 225* 264*  BUN 63* 82* 84*  CREATININE 3.96* 3.89* 3.70*  CALCIUM 8.7* 8.5* 8.4*   LFT No results for input(s): PROT, ALBUMIN, AST, ALT, ALKPHOS, BILITOT, BILIDIR, IBILI in the last 72 hours.  Dg Chest Port 1 View  Result Date: 11/14/2016 CLINICAL DATA:  CHF . EXAM: PORTABLE CHEST 1 VIEW COMPARISON:  11/10/2016 . FINDINGS: Feeding tube noted with tip below left hemidiaphragm. Heart size normal. Low lung volumes. Left base infiltrate suggesting pneumonia. No pleural effusion or pneumothorax. Left shoulder replacement. Surgical clips right chest and lower neck. Mediastinum hilar structures are normal. IMPRESSION: 1.  Feeding tube noted with tip below left hemidiaphragm. 2. Low lung volumes. Left base infiltrate suggesting pneumonia noted . Electronically Signed   By: Marcello Moores  Register   On: 11/14/2016 10:59   Dg Abd Portable 1v  Result Date: 11/12/2016 CLINICAL DATA:  Check feeding catheter placement EXAM: PORTABLE ABDOMEN - 1 VIEW COMPARISON:  None. FINDINGS: Scattered large and small bowel gas is noted. A feeding catheter is noted coiled within the stomach with the tip directed towards the fundus. Degenerative changes of lumbar spine are seen. Postsurgical changes in the right hip are noted. IMPRESSION: Feeding catheter as described. Electronically Signed   By: Inez Catalina M.D.   On: 11/12/2016 15:56    ROS: Pt unable to give ROS due to obtundation  PHYSICAL EXAM: Blood pressure (!) 149/66, pulse (!) 107, temperature 99.2 F (37.3 C), temperature source Axillary, resp. rate (!) 27, height 5' 4"  (1.626 m), weight 54.5 kg (120 lb 2.4 oz), SpO2 95 %. HEENT: PERRL NECK:No JVD Skin: turgor decreased LUNGS:clear CARDIAC:tachy, reg.  No murmur ABD:+ BS, soft, ND, Groans with palpation RLQ EXT:no edema NEURO:obtunded  Assessment: 1. ARF most likely secondary to volume depletion +/- ATN from hypotension on admission 2. Hypernatremia sec volume depletion 3. Met acidosis sec  diarrhea 4. Enterococcus/enterobacter UTI PLAN: 1. Increase IV fluids of D5W to 200cc/hr and increase free water per FT to 200cc q 4hr 2. Cont to replace bicarb orally but increase to TID.  Will switch to IV if needed when SNa improved 3. I/Os.  Discussed with nurse that they have been incomplete 4. Daily labs as you have ordered 5. Overall Prognosis seems poor   Tonea Leiphart T 11/14/2016, 1:06 PM

## 2016-11-14 NOTE — Progress Notes (Signed)
CRITICAL VALUE ALERT  Critical value received:  Na 172 CL>130  Date of notification:  11/14/2016  Time of notification:  03:39  Critical value read back: YES  Nurse who received alert:  Malen Gauze  MD notified (1st page):  Raliegh Ip Schorr  Time of first page:  03:42  MD notified (2nd page):  Time of second page:  Responding MD:  K.Schorr  Time MD responded:  04:45

## 2016-11-14 NOTE — Progress Notes (Signed)
SLP Cancellation Note  Patient Details Name: Olivia Mullen MRN: [id number] DOB: [birth year]   Cancelled treatment:       Reason Eval/Treat Not Completed: Fatigue/lethargy limiting ability to participate. No improvements in mentation per RN. Will continue efforts.   Germain Osgood 11/14/2016, 1:12 PM  Germain Osgood, M.A. CCC-SLP [phone]

## 2016-11-14 NOTE — Progress Notes (Signed)
CRITICAL VALUE ALERT  Critical value received:  Na 170, Cl >130  Date of notification:  11/14/16  Time of notification:  11:55  Critical value read back: yes  Nurse who received alert:  Maggie  MD notified (1st page):  Wendee Beavers  Time of first page:  11:57  Responding MD:  Wendee Beavers

## 2016-11-14 NOTE — Progress Notes (Signed)
MD paged regarding pts hyperglycemia. Insulin orders added.

## 2016-11-15 ENCOUNTER — Inpatient Hospital Stay (HOSPITAL_COMMUNITY): Payer: Medicare Other

## 2016-11-15 DIAGNOSIS — R197 Diarrhea, unspecified: Secondary | ICD-10-CM

## 2016-11-15 DIAGNOSIS — A419 Sepsis, unspecified organism: Principal | ICD-10-CM

## 2016-11-15 DIAGNOSIS — E43 Unspecified severe protein-calorie malnutrition: Secondary | ICD-10-CM

## 2016-11-15 DIAGNOSIS — E272 Addisonian crisis: Secondary | ICD-10-CM

## 2016-11-15 LAB — CBC
HCT: 38.6 % (ref 36.0–46.0)
Hemoglobin: 12.7 g/dL (ref 12.0–15.0)
MCH: 28.7 pg (ref 26.0–34.0)
MCHC: 32.9 g/dL (ref 30.0–36.0)
MCV: 87.1 fL (ref 78.0–100.0)
PLATELETS: 74 10*3/uL — AB (ref 150–400)
RBC: 4.43 MIL/uL (ref 3.87–5.11)
RDW: 20.3 % — AB (ref 11.5–15.5)
WBC: 26 10*3/uL — ABNORMAL HIGH (ref 4.0–10.5)

## 2016-11-15 LAB — BASIC METABOLIC PANEL
BUN: 80 mg/dL — ABNORMAL HIGH (ref 6–20)
BUN: 76 mg/dL — AB (ref 6–20)
CALCIUM: 7.5 mg/dL — AB (ref 8.9–10.3)
CO2: 12 mmol/L — ABNORMAL LOW (ref 22–32)
CO2: 12 mmol/L — AB (ref 22–32)
CREATININE: 2.93 mg/dL — AB (ref 0.44–1.00)
Calcium: 7.7 mg/dL — ABNORMAL LOW (ref 8.9–10.3)
Chloride: >130 mmol/L (ref 101–111)
Chloride: >130 mmol/L (ref 101–111)
Creatinine, Ser: 3.12 mg/dL — ABNORMAL HIGH (ref 0.44–1.00)
GFR calc Af Amer: 15 mL/min — ABNORMAL LOW (ref >60)
GFR calc non Af Amer: 13 mL/min — ABNORMAL LOW (ref >60)
GFR calc non Af Amer: 14 mL/min — ABNORMAL LOW (ref >60)
GFR, EST AFRICAN AMERICAN: 17 mL/min — AB (ref >60)
GLUCOSE: 311 mg/dL — AB (ref 65–99)
Glucose, Bld: 282 mg/dL — ABNORMAL HIGH (ref 65–99)
Potassium: 3.3 mmol/L — ABNORMAL LOW (ref 3.5–5.1)
Potassium: 3 mmol/L — ABNORMAL LOW (ref 3.5–5.1)
Sodium: 159 mmol/L — ABNORMAL HIGH (ref 135–145)
Sodium: 154 mmol/L — ABNORMAL HIGH (ref 135–145)

## 2016-11-15 LAB — GLUCOSE, CAPILLARY
GLUCOSE-CAPILLARY: 271 mg/dL — AB (ref 65–99)
GLUCOSE-CAPILLARY: 226 mg/dL — AB (ref 65–99)
GLUCOSE-CAPILLARY: 206 mg/dL — AB (ref 65–99)
Glucose-Capillary: 243 mg/dL — ABNORMAL HIGH (ref 65–99)
Glucose-Capillary: 263 mg/dL — ABNORMAL HIGH (ref 65–99)
Glucose-Capillary: 226 mg/dL — ABNORMAL HIGH (ref 65–99)

## 2016-11-15 LAB — C DIFFICILE QUICK SCREEN W PCR REFLEX
C DIFFICILE (CDIFF) INTERP: NOT DETECTED
C Diff antigen: NEGATIVE
C Diff toxin: NEGATIVE

## 2016-11-15 LAB — PROCALCITONIN: PROCALCITONIN: 0.72 ng/mL

## 2016-11-15 MED ORDER — VANCOMYCIN HCL IN DEXTROSE 1-5 GM/200ML-% IV SOLN
1000.0000 mg | Freq: Once | INTRAVENOUS | Status: AC
  Administered 2016-11-15: 1000 mg via INTRAVENOUS
  Filled 2016-11-15: qty 200

## 2016-11-15 MED ORDER — VANCOMYCIN HCL IN DEXTROSE 750-5 MG/150ML-% IV SOLN: 750.0000 mg | INTRAVENOUS | Status: DC

## 2016-11-15 MED ORDER — INSULIN ASPART 100 UNIT/ML ~~LOC~~ SOLN
0.0000 [IU] | SUBCUTANEOUS | Status: DC
  Administered 2016-11-15 – 2016-11-16 (×5): 3 [IU] via SUBCUTANEOUS

## 2016-11-15 MED ORDER — SODIUM CHLORIDE 0.9 % IV SOLN
500.0000 mg | Freq: Two times a day (BID) | INTRAVENOUS | Status: DC
  Administered 2016-11-15 – 2016-11-16 (×2): 500 mg via INTRAVENOUS
  Filled 2016-11-15 (×2): qty 0.5

## 2016-11-15 MED ORDER — POTASSIUM CHLORIDE 20 MEQ/15ML (10%) PO SOLN
40.0000 meq | Freq: Two times a day (BID) | ORAL | Status: AC
  Administered 2016-11-15 (×2): 40 meq via ORAL
  Filled 2016-11-15 (×2): qty 30

## 2016-11-15 MED ORDER — VANCOMYCIN 50 MG/ML ORAL SOLUTION
125.0000 mg | Freq: Four times a day (QID) | ORAL | Status: DC
  Administered 2016-11-15 – 2016-11-16 (×4): 125 mg
  Filled 2016-11-15 (×6): qty 2.5

## 2016-11-15 NOTE — Progress Notes (Signed)
CSW continuing to follow for return to SNF when stable-  CSW informed [name] that there are no other options but to return to [facility] when the patient is stable since she does not have a payor source and is out of medicare days.  [name] expressed understanding but is frustrated pt could have to return to facility where he felt she was poorly cared for and contributed to her current admission.  Jorge Ny, LCSW Clinical Social Worker [phone]

## 2016-11-15 NOTE — Progress Notes (Signed)
PROGRESS NOTE                                                                                                                                                                                                             Patient Demographics:    Olivia Mullen, is a 81 y.o. female, DOB - 04-30-1936, [id number]  Admit date - 10/30/2016   Admitting Physician Waldemar Dickens, MD  Outpatient Primary MD for the patient is [name], MD  LOS - 9   Chief Complaint  Patient presents with  . Altered Mental Status       Brief Narrative   81 y.o.femalewith medical history significant of anxiety, breast cancer, CHF, depression, headaches, nephrolithiasis, hypertension, hypothyroidism, psoriasis, Chronic diarrhea/collagenous colitis ,rheumatoid arthritis presenting from skilled nursing facility for altered mental status. Workup significant for acute renal failure with a creatinine of 4.4, hypernatremia, and sepsis.   Subjective:    Olivia Mullen today Is nonverbal, cannot provide any complaints, no acute events overnight.  Assessment  & Plan :    Active Problems:   Rheumatoid arthritis (HCC)   Hypokalemia   Hypertension   AKI (acute kidney injury) ([place])   Diarrhea   Protein-calorie malnutrition, severe   Acute adrenal insufficiency (HCC)   C. difficile diarrhea   Sepsis (HCC)   Hypomagnesemia  Sepsis - Hypothermic, with significant leukocytosis, acute renal failure and elevated lactic acid on admission. - Most likely due to urinary source, urine culture growing Enterobacter species, and enterococcus faecalis - Treated with IV Zosyn for first 7 days which should treat her enterococcus infection. - Currently on meropenem started 1/23 for Enterobacter UTI. - Patient with worsening leukocytosis, received his dose IV steroids initially, but remains ill appearing, so will evaluate for other sources of leukocytosis, will repeat C. difficile even  though initially was negative, will trend pro-calcitonin level and repeat blood cultures. - Questionable left lung base infiltrate suspicious for pneumonia, will add vancomycin IV to cover for HCAP.  Diarrhea - Patient with known history of chronic diarrhea with collagenous colitis - C. difficile on admission is negative, but with low grade temperature, continue to have worsening leukocytosis, and has been on Abx since admission, will repeat C. difficile level - We'll change IV Flagyl to vancomycin via tube.  Acute encephalopathy - Metabolic secondary to infection, and multiple electrolyte  abnormalities - We'll check CT head   Acute renal failure - Secondary to volume depletion, with possibly ATN from hypotension/sepsis on admission - Renal input greatly appreciated, continue with IV fluid - Continue with bicarbonate for metabolic acidosis  Hypernatremia - Continue to volume depletion, continue with IV D5W, trending down   Anemia - Word 2 units PRBC transfusion, continue to monitor and transfuse as needed  Hypokalemia/hypomagnesemia - Monitor and replete as needed  Hypothyroidism -  Continue Synthroid via tube   Protein-calorie malnutrition, severe - Will place on nutritional supplementation once patient is able to take food orally  Thrombocytopenia - Likely due to infection and sepsis, continue with the CD4 DVT prophylaxis  Hyperglycemia - No history of diabetes mellitus in the past, secondary to D5W and tube feed, will start on insulin sliding scale every 4 hours.  Code Status : DNR  Family Communication  : None at bedside  Disposition Plan  : pending further work up.  Consults  :  renal  Procedures  : None  DVT Prophylaxis  :   SCDs   Lab Results  Component Value Date   PLT 74 (L) 11/15/2016    Antibiotics  :    Anti-infectives    Start     Dose/Rate Route Frequency Ordered Stop   11/15/16 1245  vancomycin (VANCOCIN) 50 mg/mL oral solution 125 mg      125 mg Per Tube Every 6 hours 11/15/16 1230     11/12/16 1300  meropenem (MERREM) 500 mg in sodium chloride 0.9 % 50 mL IVPB     500 mg 100 mL/hr over 30 Minutes Intravenous Every 24 hours 11/12/16 1132     11/10/16 1500  metroNIDAZOLE (FLAGYL) IVPB 500 mg  Status:  Discontinued     500 mg 100 mL/hr over 60 Minutes Intravenous Every 8 hours 11/10/16 1447 11/15/16 1230   11/07/16 1200  piperacillin-tazobactam (ZOSYN) IVPB 2.25 g  Status:  Discontinued     2.25 g 100 mL/hr over 30 Minutes Intravenous Every 8 hours 11/07/16 0922 11/12/16 1128   11/07/16 0000  piperacillin-tazobactam (ZOSYN) IVPB 3.375 g  Status:  Discontinued     3.375 g 12.5 mL/hr over 240 Minutes Intravenous Every 12 hours 11/04/2016 1334 11/07/16 0922   11/09/2016 1800  vancomycin (VANCOCIN) 50 mg/mL oral solution 125 mg  Status:  Discontinued    Comments:  FOR 10 DAYS     125 mg Oral 4 times daily 10/21/2016 1742 11/12/16 1033   11/08/2016 1200  piperacillin-tazobactam (ZOSYN) IVPB 3.375 g     3.375 g 100 mL/hr over 30 Minutes Intravenous  Once 10/26/2016 1157 11/10/2016 1240   10/24/2016 1200  vancomycin (VANCOCIN) IVPB 1000 mg/200 mL premix     1,000 mg 200 mL/hr over 60 Minutes Intravenous  Once 11/07/2016 1157 11/05/2016 1315        Objective:   Vitals:   11/15/16 0400 11/15/16 0500 11/15/16 0800 11/15/16 1200  BP: (!) 150/61  (!) 145/58 (!) 134/55  Pulse:   84 95  Resp: (!) 21  20 (!) 24  Temp: 98.1 F (36.7 C)   99.8 F (37.7 C)  TempSrc: Axillary   Axillary  SpO2: 99%  98% 97%  Weight:  57.6 kg (126 lb 15.8 oz)    Height:        Wt Readings from Last 3 Encounters:  11/15/16 57.6 kg (126 lb 15.8 oz)  06/30/16 67.2 kg (148 lb 2.4 oz)  06/03/16 67.6 kg (149 lb)  Intake/Output Summary (Last 24 hours) at 11/15/16 1230 Last data filed at 11/15/16 1000  Gross per 24 hour  Intake             5985 ml  Output             1200 ml  Net             4785 ml    Physical exam Obtundent, moaning to tactile stimuli ,  ill-appearing Supple Neck,No JVD, Symmetrical Chest wall movement,  fair air movement bilateralno wheezing RRR,No Gallops,Rubs or new Murmurs, No Parasternal Heave +ve B.Sounds, Abd Soft, No tenderness, No rebound - guarding or rigidity. No Cyanosis, Clubbing or edema, No new Rash or bruise      Data Review:    CBC  Recent Labs Lab 11/11/16 0950 11/12/16 1530 11/13/16 0213 11/14/16 0227 11/15/16 0640  WBC 33.3* 29.8* 22.3* 27.8* 26.0*  HGB 14.8 15.1* 13.9 13.6 12.7  HCT 42.6 42.6 40.3 40.0 38.6  PLT 100* 76* 89* 100* 74*  MCV 84.4 83.7 85.0 85.3 87.1  MCH 29.3 29.7 29.3 29.0 28.7  MCHC 34.7 35.4 34.5 34.0 32.9  RDW 17.2* 18.6* 18.8* 19.4* 20.3*    Chemistries   Recent Labs Lab 11/09/16 1330  11/11/16 0950  11/14/16 0227 11/14/16 1036 11/14/16 1909 11/15/16 0022 11/15/16 0640  NA  --   < > 158*  < > 172* 170* 163* 159* 154*  K  --   < > 3.2*  < > 3.4* 4.1 3.0* 3.3* 3.0*  CL  --   < > >130*  < > >130* >130* >130* >130* >130*  CO2  --   < > 11*  < > 12* 10* 12* 12* 12*  GLUCOSE  --   < > 113*  < > 225* 264* 346* 282* 311*  BUN  --   < > 49*  < > 82* 84* 84* 80* 76*  CREATININE  --   < > 4.09*  < > 3.89* 3.70* 3.50* 3.12* 2.93*  CALCIUM  --   < > 8.8*  < > 8.5* 8.4* 7.7* 7.7* 7.5*  MG 2.3  --   --   --   --   --   --   --   --   AST  --   --  72*  --   --   --   --   --   --   ALT  --   --  87*  --   --   --   --   --   --   ALKPHOS  --   --  129*  --   --   --   --   --   --   BILITOT  --   --  1.2  --   --   --   --   --   --   < > = values in this interval not displayed. ------------------------------------------------------------------------------------------------------------------ No results for input(s): CHOL, HDL, LDLCALC, TRIG, CHOLHDL, LDLDIRECT in the last 72 hours.  Lab Results  Component Value Date   HGBA1C 5.3 03/06/2012   ------------------------------------------------------------------------------------------------------------------ No  results for input(s): TSH, T4TOTAL, T3FREE, THYROIDAB in the last 72 hours.  Invalid input(s): FREET3 ------------------------------------------------------------------------------------------------------------------ No results for input(s): VITAMINB12, FOLATE, FERRITIN, TIBC, IRON, RETICCTPCT in the last 72 hours.  Coagulation profile No results for input(s): INR, PROTIME in the last 168 hours.  No results for input(s): DDIMER in the last 72 hours.  Cardiac Enzymes No results for input(s): CKMB, TROPONINI, MYOGLOBIN in the last 168 hours.  Invalid input(s): CK ------------------------------------------------------------------------------------------------------------------    Component Value Date/Time   BNP 795.1 (H) 11/14/2016 1036    Inpatient Medications  Scheduled Meds: . budesonide  9 mg Oral Daily  . chlorhexidine  15 mL Mouth Rinse BID  . free water  200 mL Per Tube Q4H  . insulin aspart  0-9 Units Subcutaneous TID WC  . levothyroxine  75 mcg Per Tube QAC breakfast  . mouth rinse  15 mL Mouth Rinse q12n4p  . meropenem (MERREM) IV  500 mg Intravenous Q24H  . sodium bicarbonate  1,300 mg Per Tube TID  . sodium chloride flush  3 mL Intravenous Q12H  . vancomycin  125 mg Per Tube Q6H   Continuous Infusions: . dextrose 100 mL/hr at 11/15/16 0828  . feeding supplement (JEVITY 1.2 CAL) 1,000 mL (11/15/16 0230)   PRN Meds:.ipratropium-albuterol, ondansetron **OR** ondansetron (ZOFRAN) IV, SUMAtriptan  Micro Results Recent Results (from the past 240 hour(s))  Urine culture     Status: Abnormal   Collection Time: 10/27/2016 12:09 PM  Result Value Ref Range Status   Specimen Description URINE, RANDOM  Final   Special Requests NONE  Final   Culture MULTIPLE SPECIES PRESENT, SUGGEST RECOLLECTION (A)  Final   Report Status 11/07/2016 FINAL  Final  Blood Culture (routine x 2)     Status: None   Collection Time: 11/12/2016 12:20 PM  Result Value Ref Range Status   Specimen  Description BLOOD LEFT ANTECUBITAL  Final   Special Requests BOTTLES DRAWN AEROBIC AND ANAEROBIC 5CC  Final   Culture NO GROWTH 5 DAYS  Final   Report Status 11/11/2016 FINAL  Final  Blood Culture (routine x 2)     Status: None   Collection Time: 11/12/2016 12:40 PM  Result Value Ref Range Status   Specimen Description BLOOD LEFT HAND  Final   Special Requests BOTTLES DRAWN AEROBIC AND ANAEROBIC 5CC  Final   Culture NO GROWTH 5 DAYS  Final   Report Status 11/11/2016 FINAL  Final  C difficile quick scan w PCR reflex     Status: None   Collection Time: 10/27/2016 12:43 PM  Result Value Ref Range Status   C Diff antigen NEGATIVE NEGATIVE Final   C Diff toxin NEGATIVE NEGATIVE Final   C Diff interpretation No C. difficile detected.  Final  MRSA PCR Screening     Status: None   Collection Time: 11/09/2016  7:00 PM  Result Value Ref Range Status   MRSA by PCR NEGATIVE NEGATIVE Final    Comment:        The GeneXpert MRSA Assay (FDA approved for NASAL specimens only), is one component of a comprehensive MRSA colonization surveillance program. It is not intended to diagnose MRSA infection nor to guide or monitor treatment for MRSA infections.   Culture, Urine     Status: Abnormal   Collection Time: 11/09/16  7:57 AM  Result Value Ref Range Status   Specimen Description URINE, RANDOM  Final   Special Requests NONE  Final   Culture (A)  Final    >=100,000 COLONIES/mL ENTEROCOCCUS FAECALIS >=100,000 COLONIES/mL ENTEROBACTER SPECIES    Report Status 11/12/2016 FINAL  Final   Organism ID, Bacteria ENTEROCOCCUS FAECALIS (A)  Final   Organism ID, Bacteria ENTEROBACTER SPECIES (A)  Final      Susceptibility   Enterococcus faecalis - MIC*    AMPICILLIN <=2 SENSITIVE Sensitive  LEVOFLOXACIN 1 SENSITIVE Sensitive     NITROFURANTOIN <=16 SENSITIVE Sensitive     VANCOMYCIN 1 SENSITIVE Sensitive     * >=100,000 COLONIES/mL ENTEROCOCCUS FAECALIS   Enterobacter species - MIC*    CEFAZOLIN  >=64 RESISTANT Resistant     CEFTRIAXONE 16 INTERMEDIATE Intermediate     CIPROFLOXACIN <=0.25 SENSITIVE Sensitive     GENTAMICIN <=1 SENSITIVE Sensitive     IMIPENEM 2 SENSITIVE Sensitive     NITROFURANTOIN 256 RESISTANT Resistant     TRIMETH/SULFA <=20 SENSITIVE Sensitive     PIP/TAZO >=128 RESISTANT Resistant     * >=100,000 COLONIES/mL ENTEROBACTER SPECIES    Radiology Reports Dg Chest Port 1 View  Result Date: 11/14/2016 CLINICAL DATA:  CHF . EXAM: PORTABLE CHEST 1 VIEW COMPARISON:  11/10/2016 . FINDINGS: Feeding tube noted with tip below left hemidiaphragm. Heart size normal. Low lung volumes. Left base infiltrate suggesting pneumonia. No pleural effusion or pneumothorax. Left shoulder replacement. Surgical clips right chest and lower neck. Mediastinum hilar structures are normal. IMPRESSION: 1.  Feeding tube noted with tip below left hemidiaphragm. 2. Low lung volumes. Left base infiltrate suggesting pneumonia noted . Electronically Signed   By: Marcello Moores  Register   On: 11/14/2016 10:59   Dg Chest Port 1 View  Result Date: 11/10/2016 CLINICAL DATA:  81 y/o  F; aspiration of the airways. EXAM: PORTABLE CHEST 1 VIEW COMPARISON:  11/13/2016 chest radiograph FINDINGS: Stable cardiac silhouette within normal limits given projection and technique. Aortic atherosclerosis with calcification. Thyroid surgery surgical clips. Partially visualized left shoulder hemiarthroplasty. Diminished bilateral acromial humeral distance bilateral indicating rotator cuff injury. Right axillary surgical clips. Pulmonary venous hypertension. No focal consolidation. No pleural effusion. IMPRESSION: Pulmonary venous hypertension.  No focal consolidation. Electronically Signed   By: Kristine Garbe M.D.   On: 11/10/2016 06:14   Dg Chest Port 1 View  Result Date: 10/28/2016 CLINICAL DATA:  Altered mental status. EXAM: PORTABLE CHEST 1 VIEW COMPARISON:  09/14/2016. FINDINGS: The heart size and mediastinal  contours are within normal limits. Aortic atherosclerosis noted. Both lungs are clear. Surgical clips noted in the right axilla. Previous left shoulder hemiarthroplasty. IMPRESSION: No active disease. Electronically Signed   By: Kerby Moors M.D.   On: 11/15/2016 12:14   Dg Abd Portable 1v  Result Date: 11/12/2016 CLINICAL DATA:  Check feeding catheter placement EXAM: PORTABLE ABDOMEN - 1 VIEW COMPARISON:  None. FINDINGS: Scattered large and small bowel gas is noted. A feeding catheter is noted coiled within the stomach with the tip directed towards the fundus. Degenerative changes of lumbar spine are seen. Postsurgical changes in the right hip are noted. IMPRESSION: Feeding catheter as described. Electronically Signed   By: Inez Catalina M.D.   On: 11/12/2016 15:56     Olivia Mullen M.D on 11/15/2016 at 12:30 PM  Between 7am to 7pm - Pager - [phone]  After 7pm go to [email or web address] - password [facility]  [facility] -  Office  [phone]

## 2016-11-15 NOTE — Progress Notes (Signed)
[facility] Rounding Note  Subjective/Interval History:   Getting free water via tube (200 every 4 hours) and IV (D5W) Remains unresponsive Now has flexiseal for watery diarrhea  Objective Vital signs in last 24 hours: Vitals:   11/15/16 0400 11/15/16 0500 11/15/16 0800 11/15/16 1200  BP: (!) 150/61  (!) 145/58 (!) 134/55  Pulse:   84 95  Resp: (!) 21  20 (!) 24  Temp: 98.1 F (36.7 C)   99.8 F (37.7 C)  TempSrc: Axillary   Axillary  SpO2: 99%  98% 97%  Weight:  57.6 kg (126 lb 15.8 oz)    Height:       Weight change: 3.1 kg (6 lb 13.4 oz)  Intake/Output Summary (Last 24 hours) at 11/15/16 1246 Last data filed at 11/15/16 1000  Gross per 24 hour  Intake             5985 ml  Output             1200 ml  Net             4785 ml   Physical Exam:  Blood pressure (!) 134/55, pulse 95, temperature 99.8 F (37.7 C), temperature source Axillary, resp. rate (!) 24, height 5' 4" (1.626 m), weight 57.6 kg (126 lb 15.8 oz), SpO2 97 %.  Pt is obtunded Mouth breathing/hyperpneic No JVD Lungs clear S1S2 No S3 Soft abd No LE edema but both arms edematous Flexiseal with watery dark brown stool  Labs:   Recent Labs Lab 11/12/16 1530 11/13/16 0213 11/14/16 0227 11/14/16 1036 11/14/16 1909 11/15/16 0022 11/15/16 0640  NA 166* 166* 172* 170* 163* 159* 154*  K 3.4* 3.1* 3.4* 4.1 3.0* 3.3* 3.0*  CL >130* >130* >130* >130* >130* >130* >130*  CO2 12* 10* 12* 10* 12* 12* 12*  GLUCOSE 65 187* 225* 264* 346* 282* 311*  BUN 57* 63* 82* 84* 84* 80* 76*  CREATININE 3.90* 3.96* 3.89* 3.70* 3.50* 3.12* 2.93*  CALCIUM 8.8* 8.7* 8.5* 8.4* 7.7* 7.7* 7.5*    Recent Labs Lab 11/11/16 0950  AST 72*  ALT 87*  ALKPHOS 129*  BILITOT 1.2  PROT 5.7*  ALBUMIN 2.2*     Recent Labs Lab 11/12/16 1530 11/13/16 0213 11/14/16 0227 11/15/16 0640  WBC 29.8* 22.3* 27.8* 26.0*  HGB 15.1* 13.9 13.6 12.7  HCT 42.6 40.3 40.0 38.6  MCV 83.7 85.0 85.3 87.1  PLT 76* 89* 100* 74*    Recent  Labs Lab 11/14/16 2022 11/14/16 2358 11/15/16 0500 11/15/16 0834 11/15/16 1205  GLUCAP 295* 278* 271* 243* 263*    Studies/Results: Dg Chest Port 1 View  Result Date: 11/14/2016 CLINICAL DATA:  CHF . EXAM: PORTABLE CHEST 1 VIEW COMPARISON:  11/10/2016 . FINDINGS: Feeding tube noted with tip below left hemidiaphragm. Heart size normal. Low lung volumes. Left base infiltrate suggesting pneumonia. No pleural effusion or pneumothorax. Left shoulder replacement. Surgical clips right chest and lower neck. Mediastinum hilar structures are normal. IMPRESSION: 1.  Feeding tube noted with tip below left hemidiaphragm. 2. Low lung volumes. Left base infiltrate suggesting pneumonia noted . Electronically Signed   By: Lavina   On: 11/14/2016 10:59   Medications: . dextrose 100 mL/hr at 11/15/16 0569  . feeding supplement (JEVITY 1.2 CAL) 1,000 mL (11/15/16 0230)   . budesonide  9 mg Oral Daily  . chlorhexidine  15 mL Mouth Rinse BID  . free water  200 mL Per Tube Q4H  . insulin aspart  0-9  Units Subcutaneous Q4H  . levothyroxine  75 mcg Per Tube QAC breakfast  . mouth rinse  15 mL Mouth Rinse q12n4p  . meropenem (MERREM) IV  500 mg Intravenous Q24H  . sodium bicarbonate  1,300 mg Per Tube TID  . sodium chloride flush  3 mL Intravenous Q12H  . vancomycin  125 mg Per Tube Q6H    Background: 80yo WF admitted 11/02/2016 from NH for decreased responsiveness with poor PO intake.  Found to have ARF and marked metabolic acidosis.  Scr 4.42 on admission with last Scr 11/17 that was 1.11.  First recorded BP in ED 67. Bicarb was <7 with NL AG. SNa which was NL rose to 170 and renal was asked to see 1/25.  Pt unable to give hx as she is obtunded.  UC grew enterococcus.  Was recently been treated for C diff PTA and C diff neg so far since admission.  Assessment  1. ARF secondary to volume depletion +/- ATN from hypotension on admission + ARB on pre-adm med rec - improving 2. Hypernatremia  secondary to volume depletion - sodium down from 172->154  3. Met acidosis secondary to diarrhea 4. Enterococcus/enterobacter UTI 5. AMS - do not know baseline 6. Diarrhea with h/o CDiff treated in past PLAN: 1. D5 reduced from 200/hour to 100/hour; continue free water VT at 200 cc Q4hr 2. Continue sodium bicarb per tube for now. Will switch to IV if needed when SNa improved 3. Replete K (ordered)  4. Daily labs as you have ordered 5. Meropenem for UTI 6. CDiff pending, enteral vanco started    , MD [facility] [phone] pager 11/15/2016, 12:46 PM 

## 2016-11-15 NOTE — Progress Notes (Signed)
Inpatient Diabetes Program Recommendations  AACE/ADA: New Consensus Statement on Inpatient Glycemic Control (2015)  Target Ranges:  Prepandial:   less than 140 mg/dL      Peak postprandial:   less than 180 mg/dL (1-2 hours)      Critically ill patients:  140 - 180 mg/dL   Results for TAURUS, WEIAND (MRN [id number]) as of 11/15/2016 12:10  Ref. Range 11/14/2016 20:22 11/14/2016 23:58 11/15/2016 05:00 11/15/2016 08:34 11/15/2016 12:05  Glucose-Capillary Latest Ref Range: 65 - 99 mg/dL 295 (H) 278 (H) 271 (H) 243 (H) 263 (H)   Review of Glycemic Control  Diabetes history: None Current orders for Inpatient glycemic control: Novolog Sensitive TID  Inpatient Diabetes Program Recommendations:   Glucose mid to high 200's. While on Tube Feeds, Please adjust correction scale to Q4 hours and add Tube Feed coverage Novolog 3 units Q4 hours in addition to correction.  Thanks,  Tama Headings RN, MSN, [facility] Inpatient Diabetes Coordinator Team Pager [phone] (8a-5p)

## 2016-11-15 NOTE — Progress Notes (Signed)
SLP Cancellation Note  Patient Details Name: Olivia Mullen MRN: [id number] DOB: 01-22-1936   Cancelled treatment:        Pt still not appropriately alert for PO trials.    Mukesh Kornegay, Katherene Ponto 11/15/2016, 8:47 AM

## 2016-11-15 NOTE — Progress Notes (Addendum)
Pharmacy Antibiotic Note Olivia Mullen is a 81 y.o. female admitted on 11/20/2016 with sepsis due to unknown source.  Urine cultures resulted as Enterococcus (likely appropriately treated with 7d Zosyn) and Enterobacter (R-Zosyn). Currently on day 4 of meropenem and pharmacy asked to add vancomycin due to concern for PNA and persistent leukocytosis.    Plan: 1. Continue Meropenem 500 mg IV every 24 hours 2. Vancomycin 1000 mg IV x 1 now followed by 750 mg IV every 48 hours starting on 1/28 3. Would limit abx exposure as feasible due to hx and concern for c.diff  Height: 5\' 4"  (162.6 cm) Weight: 126 lb 15.8 oz (57.6 kg) IBW/kg (Calculated) : 54.7  Temp (24hrs), Avg:98.7 F (37.1 C), Min:98.1 F (36.7 C), Max:99.2 F (37.3 C)   Recent Labs Lab 11/11/16 0950 11/12/16 1530 11/13/16 0213 11/14/16 0227 11/14/16 1036 11/14/16 1909 11/15/16 0022 11/15/16 0640  WBC 33.3* 29.8* 22.3* 27.8*  --   --   --  26.0*  CREATININE 4.09* 3.90* 3.96* 3.89* 3.70* 3.50* 3.12* 2.93*    Estimated Creatinine Clearance: 13.7 mL/min (by C-G formula based on SCr of 2.93 mg/dL (H)).    Allergies  Allergen Reactions  . Gabapentin Itching  . Isosorbide Nitrate Itching  . Wellbutrin [Bupropion] Other (See Comments)    mental changes and increase anger  . Zonisamide Itching    Antimicrobials this admission:  Zosyn 1/17 >> 1/23 Vancomycin 1/17 >> 1/19 Flagyl 1/21>> 1/26  Meropenem 1/23 >>  Vancomycin PO 1/13 >> 1/23 and 1/26 >>   Vancomycin IV 1/26 >>    Microbiology results: 1/17 MRSA PCR: negative  1/17 blood cx: ngF 1/17 C. Diff: negative 1/20 UCx: Enterococcus (pan-S) + Enterobacter (S-bactrim/Cipro/Gent/Imi)  Thank you for allowing pharmacy to be a part of this patient's care.  Vincenza Hews, PharmD, BCPS 11/15/2016, 11:42 AM

## 2016-11-15 NOTE — Progress Notes (Signed)
Nutrition Follow Up  DOCUMENTATION CODES:   Severe malnutrition in context of chronic illness  INTERVENTION:    Continue Jevity 1.2 formula at goal rate of 50 ml/hr  TF regimen providing 1440 kcals, 67 gm protein, 968 ml of free water  NUTRITION DIAGNOSIS:   Inadequate oral intake related to inability to eat as evidenced by NPO status, ongoing  GOAL:   Patient will meet greater than or equal to 90% of their needs, met  MONITOR:   TF tolerance, Diet advancement, Labs, Weight trends, I & O's  ASSESSMENT:   81 y.o. Female with PMH significant of anxiety, breast cancer, CHF, depression, headaches, nephrolithiasis, hypertension, hypothyroidism, psoriasis, rheumatoid arthritis presenting from skilled nursing facility in an acutely altered state.   CORTRAK feeding tube placed 1/23 >> tip in stomach. Jevity 1.2 formula currently infusing at goal rate of 50 ml/hr. Speech Path continues to follow >> not ready for PO trials given mentation. In ARF most likely given volume depletion. CBG's [phone].  Diet Order:  Diet NPO time specified Except for: Sips with Meds  Skin:  Reviewed, no issues  Last BM:  1/26  Height:   Ht Readings from Last 1 Encounters:  10/30/2016 _0  (1.626 m)    Weight:   Wt Readings from Last 1 Encounters:  11/15/16 126 lb 15.8 oz (57.6 kg)    Ideal Body Weight:  54.5 kg  BMI:  Body mass index is 21.8 kg/m.  Estimated Nutritional Needs:   Kcal:  1300-1500  Protein:  65-75 gm  Fluid:  </= 1.5 L  EDUCATION NEEDS:   No education needs identified at this time  Arthur Holms, RD, LDN Pager #: [phone] After-Hours Pager #: [phone]

## 2016-11-15 NOTE — Progress Notes (Signed)
Pharmacy Antibiotic Note Olivia Mullen is a 81 y.o. female admitted on 10/27/2016 with sepsis due to unknown source.  Urine cultures resulted as Enterococcus (likely appropriately treated with 7d Zosyn) and Enterobacter (R-Zosyn). Currently on day 4 of meropenem and pharmacy asked to add vancomycin due to concern for PNA and persistent leukocytosis.    Plan: 1. Adjust Meropenem to 500 mg IV every 12 hours 2. Vancomycin 1000 mg IV x 1 now followed by 750 mg IV every 48 hours starting on 1/28 3. Would limit abx exposure as feasible due to hx and concern for c.diff  Height: 5\' 4"  (162.6 cm) Weight: 126 lb 15.8 oz (57.6 kg) IBW/kg (Calculated) : 54.7  Temp (24hrs), Avg:98.9 F (37.2 C), Min:98.1 F (36.7 C), Max:99.8 F (37.7 C)   Recent Labs Lab 11/11/16 0950 11/12/16 1530 11/13/16 0213 11/14/16 0227 11/14/16 1036 11/14/16 1909 11/15/16 0022 11/15/16 0640  WBC 33.3* 29.8* 22.3* 27.8*  --   --   --  26.0*  CREATININE 4.09* 3.90* 3.96* 3.89* 3.70* 3.50* 3.12* 2.93*    Estimated Creatinine Clearance: 13.7 mL/min (by C-G formula based on SCr of 2.93 mg/dL (H)).    Allergies  Allergen Reactions  . Gabapentin Itching  . Isosorbide Nitrate Itching  . Wellbutrin [Bupropion] Other (See Comments)    mental changes and increase anger  . Zonisamide Itching    Antimicrobials this admission:  Zosyn 1/17 >> 1/23 Vancomycin 1/17 >> 1/19 Flagyl 1/21>> 1/26  Meropenem 1/23 >>  Vancomycin PO 1/13 >> 1/23 and 1/26 >>   Vancomycin IV 1/26 >>    Microbiology results: 1/17 MRSA PCR: negative  1/17 blood cx: ngF 1/17 C. Diff: negative 1/20 UCx: Enterococcus (pan-S) + Enterobacter (S-bactrim/Cipro/Gent/Imi)  Thank you for allowing pharmacy to be a part of this patient's care.  Vincenza Hews, PharmD, BCPS 11/15/2016, 1:06 PM

## 2016-11-16 DIAGNOSIS — N390 Urinary tract infection, site not specified: Secondary | ICD-10-CM

## 2016-11-16 DIAGNOSIS — I639 Cerebral infarction, unspecified: Secondary | ICD-10-CM

## 2016-11-16 DIAGNOSIS — N179 Acute kidney failure, unspecified: Secondary | ICD-10-CM

## 2016-11-16 DIAGNOSIS — D496 Neoplasm of unspecified behavior of brain: Secondary | ICD-10-CM

## 2016-11-16 LAB — CBC
HEMATOCRIT: 37.4 % (ref 36.0–46.0)
Hemoglobin: 12.6 g/dL (ref 12.0–15.0)
MCH: 28.9 pg (ref 26.0–34.0)
MCHC: 33.7 g/dL (ref 30.0–36.0)
MCV: 85.8 fL (ref 78.0–100.0)
Platelets: 73 10*3/uL — ABNORMAL LOW (ref 150–400)
RBC: 4.36 MIL/uL (ref 3.87–5.11)
RDW: 19.8 % — AB (ref 11.5–15.5)
WBC: 21.1 10*3/uL — AB (ref 4.0–10.5)

## 2016-11-16 LAB — BASIC METABOLIC PANEL
ANION GAP: 9 (ref 5–15)
BUN: 66 mg/dL — AB (ref 6–20)
CO2: 13 mmol/L — ABNORMAL LOW (ref 22–32)
Calcium: 7.4 mg/dL — ABNORMAL LOW (ref 8.9–10.3)
Chloride: 126 mmol/L — ABNORMAL HIGH (ref 101–111)
Creatinine, Ser: 2.35 mg/dL — ABNORMAL HIGH (ref 0.44–1.00)
GFR calc Af Amer: 22 mL/min — ABNORMAL LOW (ref >60)
GFR, EST NON AFRICAN AMERICAN: 19 mL/min — AB (ref >60)
Glucose, Bld: 223 mg/dL — ABNORMAL HIGH (ref 65–99)
POTASSIUM: 3.8 mmol/L (ref 3.5–5.1)
SODIUM: 148 mmol/L — AB (ref 135–145)

## 2016-11-16 LAB — GLUCOSE, CAPILLARY
GLUCOSE-CAPILLARY: 237 mg/dL — AB (ref 65–99)
Glucose-Capillary: 250 mg/dL — ABNORMAL HIGH (ref 65–99)

## 2016-11-16 MED ORDER — ACETAMINOPHEN 325 MG PO TABS: 650.0000 mg | ORAL_TABLET | Freq: Four times a day (QID) | ORAL | Status: DC | PRN

## 2016-11-16 MED ORDER — BIOTENE DRY MOUTH MT LIQD: 15.0000 mL | OROMUCOSAL | Status: DC | PRN

## 2016-11-16 MED ORDER — GLYCOPYRROLATE 1 MG PO TABS: 1.0000 mg | ORAL_TABLET | ORAL | Status: DC | PRN

## 2016-11-16 MED ORDER — LORAZEPAM 1 MG PO TABS: 1.0000 mg | ORAL_TABLET | ORAL | Status: DC | PRN

## 2016-11-16 MED ORDER — ALBUTEROL SULFATE (2.5 MG/3ML) 0.083% IN NEBU: 2.5000 mg | INHALATION_SOLUTION | RESPIRATORY_TRACT | Status: DC | PRN

## 2016-11-16 MED ORDER — ONDANSETRON 4 MG PO TBDP
4.0000 mg | ORAL_TABLET | Freq: Four times a day (QID) | ORAL | Status: DC | PRN
  Filled 2016-11-16: qty 1

## 2016-11-16 MED ORDER — POLYVINYL ALCOHOL 1.4 % OP SOLN
1.0000 [drp] | Freq: Four times a day (QID) | OPHTHALMIC | Status: DC | PRN
  Filled 2016-11-16: qty 15

## 2016-11-16 MED ORDER — HALOPERIDOL 0.5 MG PO TABS
0.5000 mg | ORAL_TABLET | ORAL | Status: DC | PRN
  Filled 2016-11-16: qty 1

## 2016-11-16 MED ORDER — LORAZEPAM 2 MG/ML PO CONC: 1.0000 mg | ORAL | Status: DC | PRN

## 2016-11-16 MED ORDER — ACETAMINOPHEN 650 MG RE SUPP: 650.0000 mg | Freq: Four times a day (QID) | RECTAL | Status: DC | PRN

## 2016-11-16 MED ORDER — LORAZEPAM 2 MG/ML IJ SOLN: 1.0000 mg | INTRAMUSCULAR | Status: DC | PRN

## 2016-11-16 MED ORDER — ONDANSETRON HCL 4 MG/2ML IJ SOLN: 4.0000 mg | Freq: Four times a day (QID) | INTRAMUSCULAR | Status: DC | PRN

## 2016-11-16 MED ORDER — MORPHINE SULFATE (PF) 2 MG/ML IV SOLN: 1.0000 mg | INTRAVENOUS | Status: DC | PRN

## 2016-11-16 MED ORDER — GLYCOPYRROLATE 0.2 MG/ML IJ SOLN
0.2000 mg | INTRAMUSCULAR | Status: DC | PRN
Start: 2016-11-16 — End: 2016-11-16

## 2016-11-16 MED ORDER — HALOPERIDOL LACTATE 5 MG/ML IJ SOLN: 0.5000 mg | INTRAMUSCULAR | Status: DC | PRN

## 2016-11-16 MED ORDER — GLYCOPYRROLATE 0.2 MG/ML IJ SOLN: 0.2000 mg | INTRAMUSCULAR | Status: DC | PRN

## 2016-11-16 MED ORDER — DIPHENHYDRAMINE HCL 50 MG/ML IJ SOLN: 12.5000 mg | INTRAMUSCULAR | Status: DC | PRN

## 2016-11-16 MED ORDER — HALOPERIDOL LACTATE 2 MG/ML PO CONC
0.5000 mg | ORAL | Status: DC | PRN
  Filled 2016-11-16: qty 0.3

## 2016-11-18 ENCOUNTER — Encounter: Payer: Self-pay | Admitting: *Deleted

## 2016-11-18 ENCOUNTER — Other Ambulatory Visit: Payer: Self-pay | Admitting: *Deleted

## 2016-11-18 NOTE — Patient Outreach (Signed)
[facility]) Care Management  11/18/2016  MY BONGIOVANNI 08-27-1936 IC:[id number]   CSW received an In [place] message from [facility] Liaison with [facility], indicating that patient expired on Saturday, January 27th, at [place] will send condolences to patient's family.  CSW will perform a case closure on patient.  CSW will notify patient's Telephonic RNCM with [facility], Quinn Plowman of CSW's plans to close patient's case.  CSW will fax an update to patient's Primary Care Physician, Dr. Carol Ada  to ensure that they are aware of CSW's involvement with patient's plan of care.  CSW will submit a case closure request to Josepha Pigg, Care Management Assistant with [facility], in the form of an In [employer].   Nat Christen, BSW, MSW, LCSW  Licensed Education officer, [facility]  Mailing [place] [address], [place], [place] [address] Physical Address-[address], [place], [place] [address] Toll Free Main # [phone] Fax # [phone] Cell # [phone]  Office # [phone] [email or web address]

## 2016-11-20 LAB — CULTURE, BLOOD (ROUTINE X 2)
Culture: NO GROWTH
Culture: NO GROWTH

## 2016-11-21 NOTE — Progress Notes (Addendum)
Pt breath sounds all crackles - 02 sat 91 % and dropping to mid 80's. Placed on 3 L n/c pt is a mouth breather sats did not improve placed on 45 % Venti- mask oral care and suctioned back of throat obtained some bloody drainage RR 32. Pupils 3MM and reactive sluggish responds to pain. Noted some cyanosis of fingers of left hand  Text page to Dr Waldron [facility] to make aware of pt's condition

## 2016-11-21 NOTE — Progress Notes (Addendum)
Body taken to [facility] with belongings

## 2016-11-21 NOTE — Progress Notes (Signed)
Patient with resp distress this am , more agonal, with diffuse crackles, CT head obtained overnight showing subacute CVA, and went for by 2.5 x 3.5 cm mass in the antrum of the right lateral ventricle, and relative enlargement of the temporal horn of right lateral ventricle suggesting partial entrapment, and deterioration of patient respiratory status, as well CT head findings were discussed with her son POA, she has overall poor prognosis, poor life quality, has been declining over last few months as discussed with the son, he confirmed she is DO NOT RESUSCITATE/DO NOT INTUBATE, and discussed goals of care, and at this point he is agreeable to proceed with full comfort measures, I have informed him that hospital death is anticipated hours to days. Phillips Climes MD

## 2016-11-21 NOTE — Progress Notes (Signed)
Pt started with drop in HR from 110 rapidly down to 90's, 60's and 30's Pt stopped breathing and no HR rate noted. Pt full DNR - stayed with pt and pronounced dead at 64 by 2 RN's as noted in postmortem checklist. Dr Waldron [facility] called and notified. Dr Hulen Skains son to make aware of his mother's death. Spoke to son after DR and he told me funeral service he will use and that he wanted her belongings to go with her since he lives in [place]. Belongings listed in charting under post mortem care.  Belongings wrapped up in a white patient belonging bag and labeled with patients name. Small 2 yellow & 1 silver metal chain in smaller clear plastic bag along with 2 yellow bands as to not loose them since they are so small. Pt has a black wallet and cell phone with charger in bag along with eyeglasses

## 2016-11-21 NOTE — Discharge Summary (Signed)
Brief death summary:  Cause of death - Cardiopulmonary arrest  Secondary to: - Brain tumor - Acute renal failure - Complicated urinary tract infection  Hospital course: 81 y.o.femalewith medical history significant of anxiety, breast cancer, CHF, depression, headaches, nephrolithiasis, hypertension, hypothyroidism, psoriasis, Chronic diarrhea/collagenous colitis ,rheumatoid arthritis presenting from skilled nursing facility for altered mental status. Workup significant for acute renal failure with a creatinine of 4.4, hypernatremia, and sepsis, she was admitted to step down, started on IV fluids, started on tube feeds via NGT, and appropriate antibiotic coverage for complicated UTI, she remains encephalopathic despite treatment of sepsis, multiple electrolyte abnormalities, CT head was obtained on 1/26 which was significant for brain tumor, and subacute CVA, these findings were discussed with son ON record, her POA, given overall very poor prognosis, and poor life quality, and her continuous decline over the last few month, the decision was made for not further workup, and to focus on full comfort measure, patient passed away 2016-12-14 at 10:20 AM, I have called son, notified him, answered all his questions, he is very appreciative of the care she received at [facility].   Sepsis - Secondary to complicated UTI, initially on IV Zosyn, then transitioned to IV meropenem for enterococcus and Enterobacter UTI  HCAP - Questionable left lung base infiltrate suspicious for pneumonia, admitted vancomycin IV to cover for HCAP.  Diarrhea - Patient with known history of chronic diarrhea with collagenous colitis -  C. difficile negative 2. - Particularly IV Flagyl, then vancomycin via NG tube given recent C. difficile, and she was on IV antibiotics  Acute encephalopathy - Metabolic secondary to infection, and multiple electrolyte abnormalities, brain tumor and CVA  CVA - CT head significant for  subacute CVA, no further workup as patient changed to comfort care.  Brain tumor - CT head showingan apparent mass in the atrium of the right lateral ventricle measuring approximately 4.0 x 2.5 x 3.7 cm. This could be a parenchymal mass or masslike area that bulges into the ventricle or an intraventricular mass. There is relative enlargement of the temporal horn of the right lateral ventricle suggesting partial Entrapment. - Patient transition to comfort care, no further workup  Acute renal failure - Secondary to volume depletion, with possibly ATN from hypotension/sepsis on admission  Hypernatremia -Treated with IV D5W, renal consult appreciated, sodium was trending down  Anemia - receivd total of 2 units PRBC during hospital stay  Hypokalemia/hypomagnesemia -  replete as needed  Hypothyroidism  Protein-calorie malnutrition, severe  Thrombocytopenia  Hyperglycemia  Phillips Climes MD Pager [phone]

## 2016-11-21 DEATH — deceased

## 2017-08-25 IMAGING — CR DG CHEST 2V
2 series · 2 of 2 positions shown · non-contrast
Comparison: 03/11/2016

CLINICAL DATA: Fall out of bed, found on floor

EXAM:
CHEST  2 VIEW

[w chest lat]
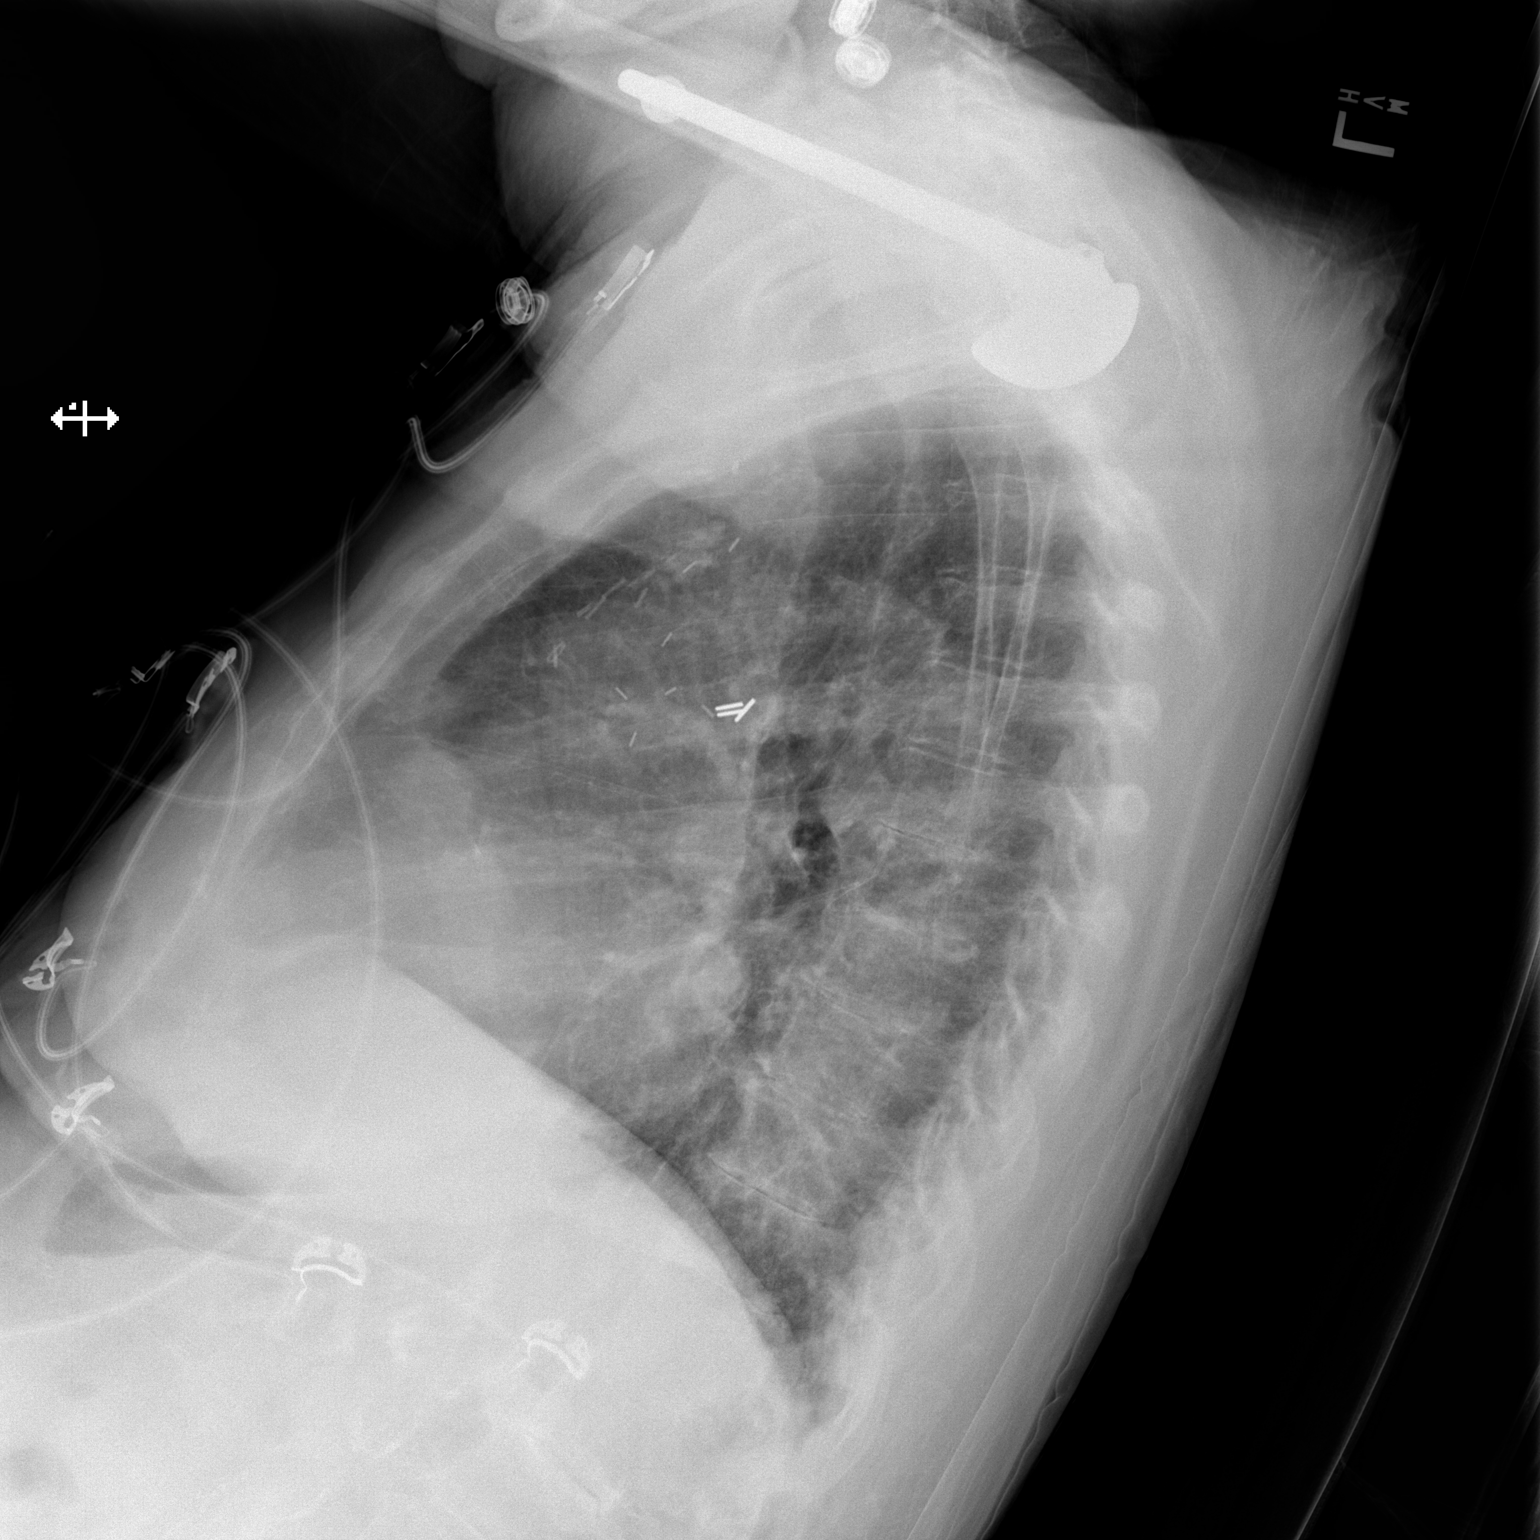

[x chest ap]
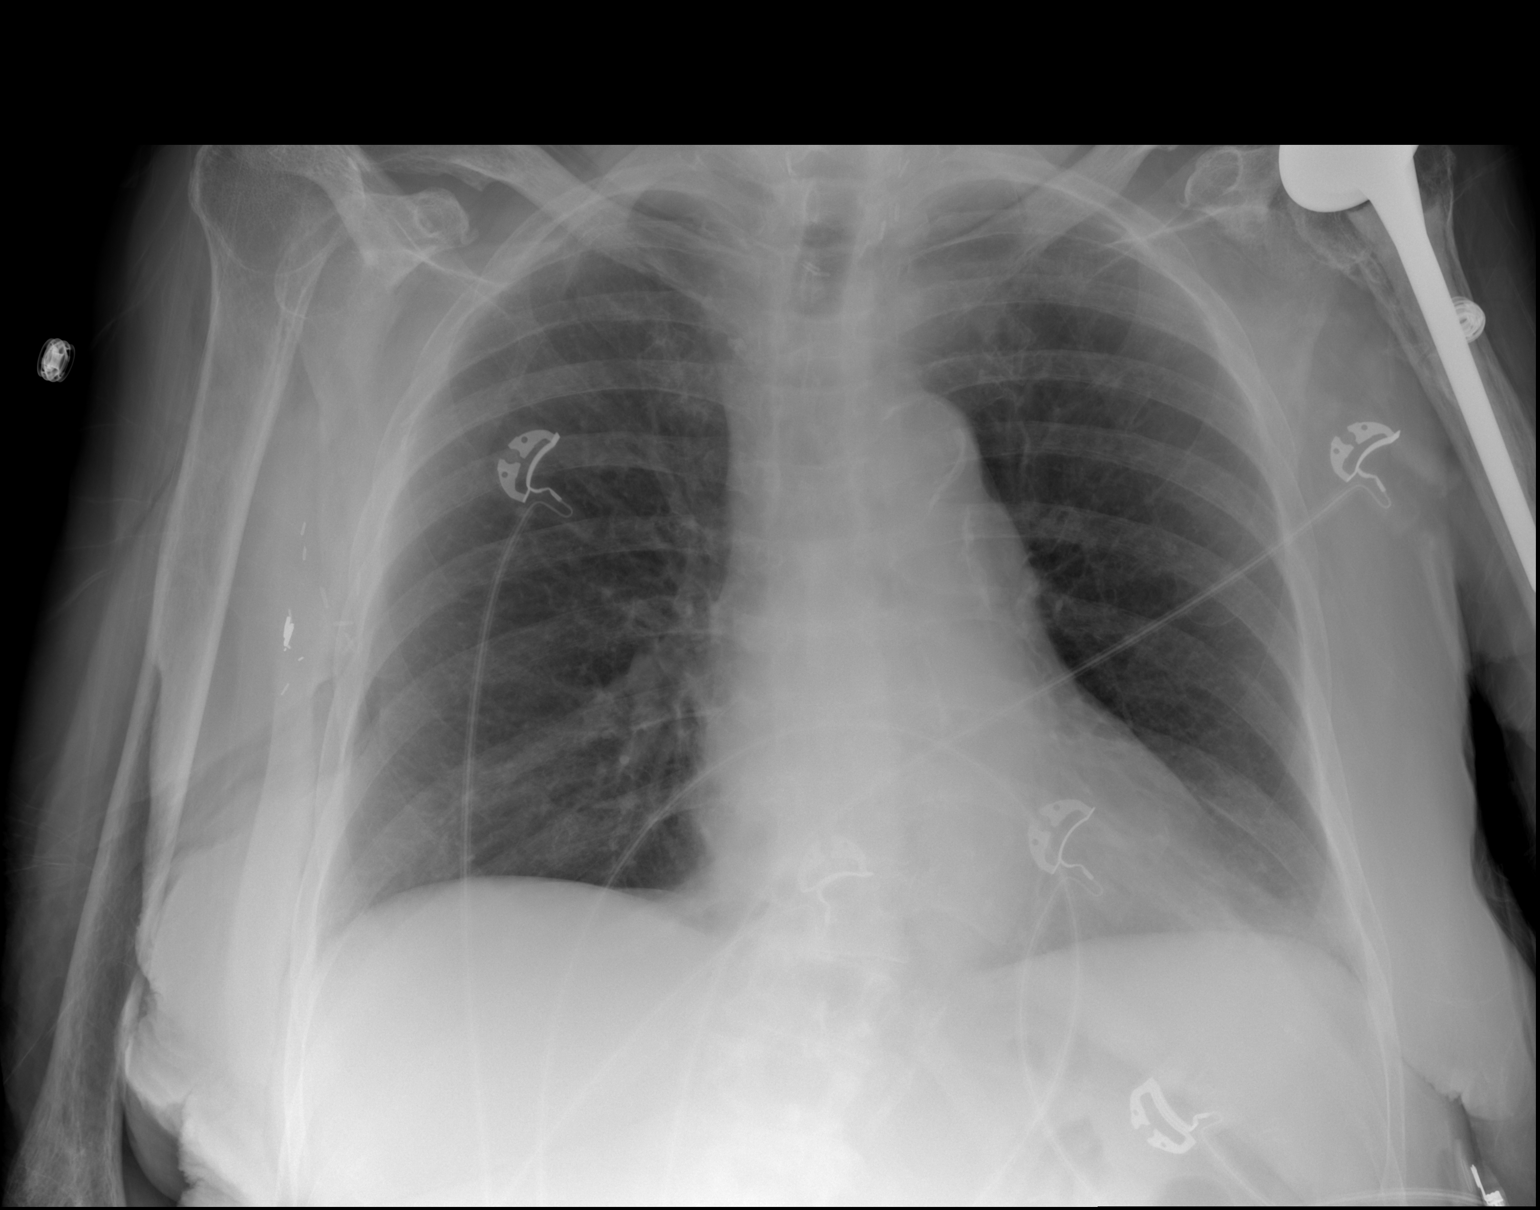

[2 of 2 positions shown; findings below may reference images not displayed]

FINDINGS: Surgical clips at the base of the neck. Surgical clips in the right
axilla. No acute pulmonary infiltrate, consolidation, or pleural
effusion. Cardiomediastinal silhouette within normal limits.
Atherosclerosis of the aorta. No pneumothorax. Partially visualized
left humerus prosthesis.
IMPRESSION: 1. No acute infiltrate or edema
2. Atherosclerosis of the aorta

## 2017-10-24 IMAGING — CR DG ABD PORTABLE 1V
1 series · 1 of 1 positions shown · non-contrast
Comparison: None.

CLINICAL DATA: Check feeding catheter placement

EXAM:
PORTABLE ABDOMEN - 1 VIEW

[supine ap]
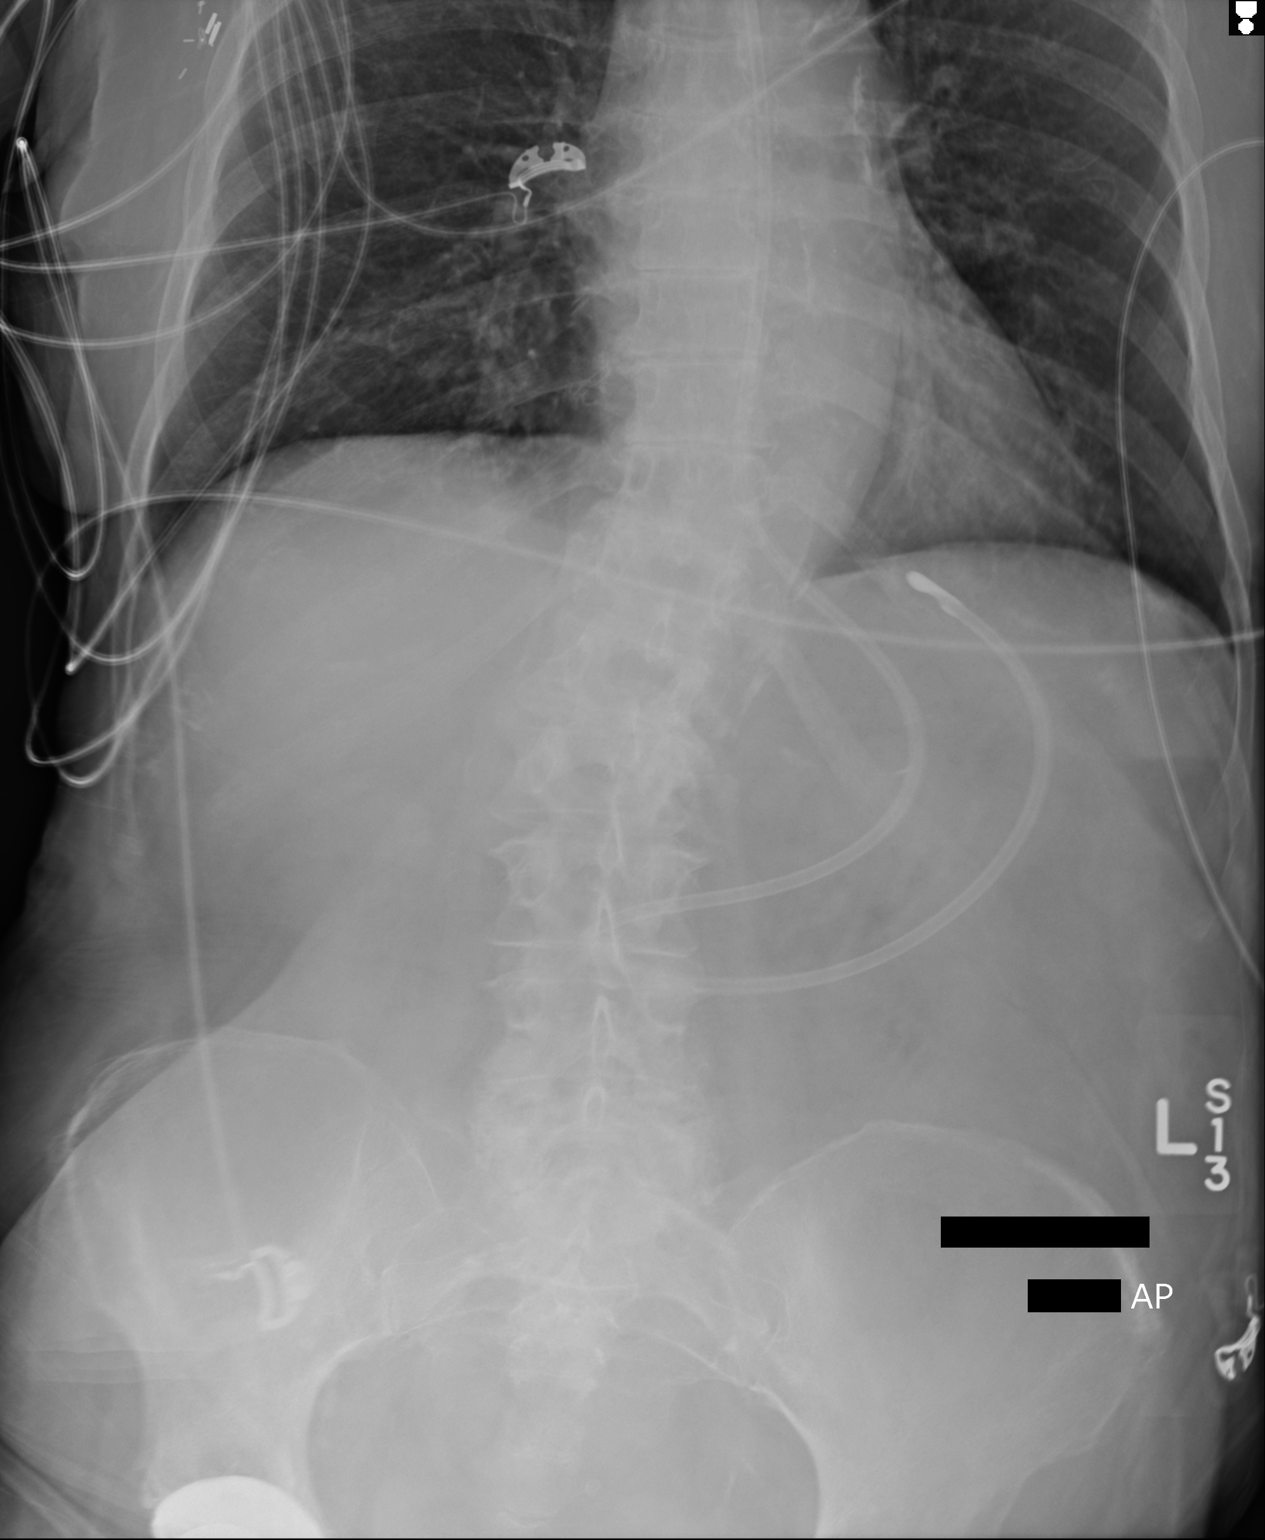

[1 of 1 positions shown; findings below may reference images not displayed]

FINDINGS: Scattered large and small bowel gas is noted. A feeding catheter is
noted coiled within the stomach with the tip directed towards the
fundus. Degenerative changes of lumbar spine are seen. Postsurgical
changes in the right hip are noted.
IMPRESSION: Feeding catheter as described.

## 2017-10-26 IMAGING — DX DG CHEST 1V PORT
1 series · 1 of 1 positions shown · non-contrast
Comparison: 11/10/2016 .

CLINICAL DATA: CHF .

EXAM:
PORTABLE CHEST 1 VIEW

[chest ap]
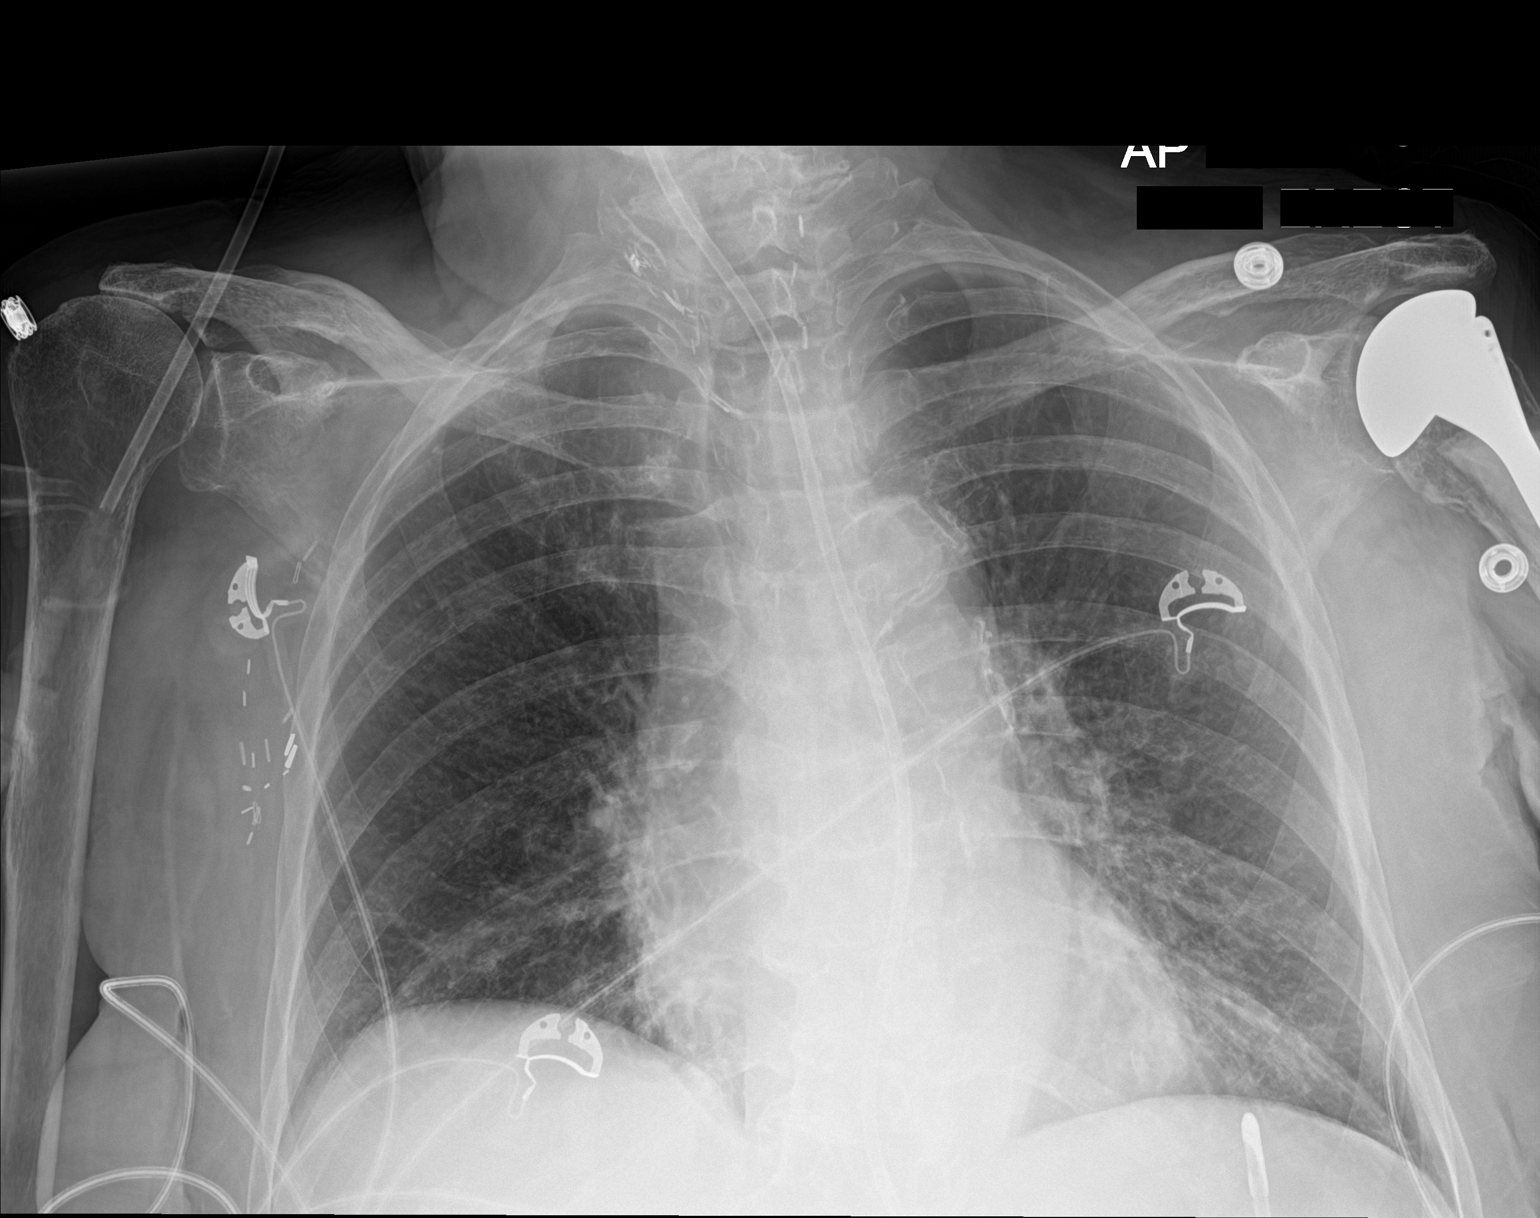

[1 of 1 positions shown; findings below may reference images not displayed]

FINDINGS: Feeding tube noted with tip below left hemidiaphragm. Heart size
normal. Low lung volumes. Left base infiltrate suggesting pneumonia.
No pleural effusion or pneumothorax. Left shoulder replacement.
Surgical clips right chest and lower neck. Mediastinum hilar
structures are normal.
IMPRESSION: 1.  Feeding tube noted with tip below left hemidiaphragm.

2. Low lung volumes. Left base infiltrate suggesting pneumonia noted
.
